# Patient Record
Sex: Female | Born: 1970 | Race: White | Hispanic: No | Marital: Married | Smoking: Former smoker
Health system: Southern US, Community
[De-identification: ages and names within clinical notes are randomized; demographics above are authoritative.]

## PROBLEM LIST (undated history)

## (undated) DIAGNOSIS — E785 Hyperlipidemia, unspecified: Secondary | ICD-10-CM

## (undated) DIAGNOSIS — M797 Fibromyalgia: Secondary | ICD-10-CM

## (undated) DIAGNOSIS — M109 Gout, unspecified: Secondary | ICD-10-CM

## (undated) DIAGNOSIS — T4145XA Adverse effect of unspecified anesthetic, initial encounter: Secondary | ICD-10-CM

## (undated) DIAGNOSIS — M722 Plantar fascial fibromatosis: Secondary | ICD-10-CM

## (undated) DIAGNOSIS — Z972 Presence of dental prosthetic device (complete) (partial): Secondary | ICD-10-CM

## (undated) DIAGNOSIS — M8580 Other specified disorders of bone density and structure, unspecified site: Secondary | ICD-10-CM

## (undated) DIAGNOSIS — E559 Vitamin D deficiency, unspecified: Secondary | ICD-10-CM

## (undated) DIAGNOSIS — F419 Anxiety disorder, unspecified: Secondary | ICD-10-CM

## (undated) DIAGNOSIS — I119 Hypertensive heart disease without heart failure: Secondary | ICD-10-CM

## (undated) DIAGNOSIS — J45909 Unspecified asthma, uncomplicated: Secondary | ICD-10-CM

## (undated) DIAGNOSIS — K259 Gastric ulcer, unspecified as acute or chronic, without hemorrhage or perforation: Secondary | ICD-10-CM

## (undated) DIAGNOSIS — G4730 Sleep apnea, unspecified: Secondary | ICD-10-CM

## (undated) DIAGNOSIS — F32A Depression, unspecified: Secondary | ICD-10-CM

## (undated) DIAGNOSIS — G4700 Insomnia, unspecified: Secondary | ICD-10-CM

## (undated) DIAGNOSIS — R42 Dizziness and giddiness: Secondary | ICD-10-CM

## (undated) DIAGNOSIS — N3010 Interstitial cystitis (chronic) without hematuria: Secondary | ICD-10-CM

## (undated) DIAGNOSIS — J309 Allergic rhinitis, unspecified: Secondary | ICD-10-CM

## (undated) DIAGNOSIS — I10 Essential (primary) hypertension: Secondary | ICD-10-CM

## (undated) DIAGNOSIS — M1990 Unspecified osteoarthritis, unspecified site: Secondary | ICD-10-CM

## (undated) DIAGNOSIS — F329 Major depressive disorder, single episode, unspecified: Secondary | ICD-10-CM

## (undated) DIAGNOSIS — T782XXA Anaphylactic shock, unspecified, initial encounter: Secondary | ICD-10-CM

## (undated) DIAGNOSIS — C539 Malignant neoplasm of cervix uteri, unspecified: Secondary | ICD-10-CM

## (undated) DIAGNOSIS — T8859XA Other complications of anesthesia, initial encounter: Secondary | ICD-10-CM

## (undated) DIAGNOSIS — K219 Gastro-esophageal reflux disease without esophagitis: Secondary | ICD-10-CM

## (undated) DIAGNOSIS — G43009 Migraine without aura, not intractable, without status migrainosus: Secondary | ICD-10-CM

## (undated) DIAGNOSIS — T63441A Toxic effect of venom of bees, accidental (unintentional), initial encounter: Secondary | ICD-10-CM

## (undated) DIAGNOSIS — N809 Endometriosis, unspecified: Secondary | ICD-10-CM

## (undated) DIAGNOSIS — E669 Obesity, unspecified: Secondary | ICD-10-CM

## (undated) DIAGNOSIS — S42309A Unspecified fracture of shaft of humerus, unspecified arm, initial encounter for closed fracture: Secondary | ICD-10-CM

## (undated) HISTORY — DX: Sleep apnea, unspecified: G47.30

## (undated) HISTORY — DX: Toxic effect of venom of bees, accidental (unintentional), initial encounter: T63.441A

## (undated) HISTORY — DX: Unspecified fracture of shaft of humerus, unspecified arm, initial encounter for closed fracture: S42.309A

## (undated) HISTORY — DX: Hyperlipidemia, unspecified: E78.5

## (undated) HISTORY — DX: Hypertensive heart disease without heart failure: I11.9

## (undated) HISTORY — PX: MOUTH SURGERY: SHX715

## (undated) HISTORY — DX: Interstitial cystitis (chronic) without hematuria: N30.10

## (undated) HISTORY — PX: ABDOMINAL HYSTERECTOMY: SHX81

## (undated) HISTORY — DX: Plantar fascial fibromatosis: M72.2

## (undated) HISTORY — DX: Insomnia, unspecified: G47.00

## (undated) HISTORY — DX: Major depressive disorder, single episode, unspecified: F32.9

## (undated) HISTORY — DX: Obesity, unspecified: E66.9

## (undated) HISTORY — DX: Fibromyalgia: M79.7

## (undated) HISTORY — DX: Other specified disorders of bone density and structure, unspecified site: M85.80

## (undated) HISTORY — DX: Endometriosis, unspecified: N80.9

## (undated) HISTORY — DX: Migraine without aura, not intractable, without status migrainosus: G43.009

## (undated) HISTORY — DX: Vitamin D deficiency, unspecified: E55.9

## (undated) HISTORY — DX: Malignant neoplasm of cervix uteri, unspecified: C53.9

## (undated) HISTORY — DX: Depression, unspecified: F32.A

## (undated) HISTORY — PX: OOPHORECTOMY: SHX86

## (undated) HISTORY — DX: Gout, unspecified: M10.9

## (undated) HISTORY — DX: Anaphylactic shock, unspecified, initial encounter: T78.2XXA

## (undated) HISTORY — DX: Allergic rhinitis, unspecified: J30.9

---

## 2001-09-30 HISTORY — PX: TOTAL ABDOMINAL HYSTERECTOMY W/ BILATERAL SALPINGOOPHORECTOMY: SHX83

## 2001-09-30 HISTORY — PX: ABDOMINAL HYSTERECTOMY: SHX81

## 2003-04-12 ENCOUNTER — Other Ambulatory Visit: Admission: RE | Admit: 2003-04-12 | Discharge: 2003-04-12 | Payer: Self-pay | Admitting: Obstetrics and Gynecology

## 2004-09-30 HISTORY — PX: LAPAROSCOPIC SALPINGO OOPHERECTOMY: SHX5927

## 2005-08-30 ENCOUNTER — Encounter (INDEPENDENT_AMBULATORY_CARE_PROVIDER_SITE_OTHER): Payer: Self-pay | Admitting: Specialist

## 2005-08-30 ENCOUNTER — Observation Stay (HOSPITAL_COMMUNITY): Admission: RE | Admit: 2005-08-30 | Discharge: 2005-08-31 | Payer: Self-pay | Admitting: Gynecology

## 2005-11-02 ENCOUNTER — Inpatient Hospital Stay: Payer: Self-pay | Admitting: Psychiatry

## 2005-11-13 ENCOUNTER — Emergency Department: Payer: Self-pay | Admitting: Emergency Medicine

## 2006-02-21 ENCOUNTER — Ambulatory Visit: Payer: Self-pay | Admitting: Gynecology

## 2006-03-06 ENCOUNTER — Emergency Department: Payer: Self-pay | Admitting: Emergency Medicine

## 2006-03-11 ENCOUNTER — Ambulatory Visit: Payer: Self-pay | Admitting: Family Medicine

## 2006-03-24 ENCOUNTER — Ambulatory Visit: Payer: Self-pay | Admitting: Gynecology

## 2006-04-22 ENCOUNTER — Ambulatory Visit: Payer: Self-pay | Admitting: Gynecology

## 2006-04-22 ENCOUNTER — Ambulatory Visit (HOSPITAL_COMMUNITY): Admission: RE | Admit: 2006-04-22 | Discharge: 2006-04-22 | Payer: Self-pay | Admitting: Gynecology

## 2006-05-19 ENCOUNTER — Ambulatory Visit: Payer: Self-pay | Admitting: Gynecology

## 2007-02-03 ENCOUNTER — Ambulatory Visit: Payer: Self-pay | Admitting: Pain Medicine

## 2007-04-16 ENCOUNTER — Ambulatory Visit: Payer: Self-pay | Admitting: Obstetrics & Gynecology

## 2007-11-26 ENCOUNTER — Encounter: Admission: RE | Admit: 2007-11-26 | Discharge: 2007-11-26 | Payer: Self-pay | Admitting: Gynecology

## 2008-05-05 ENCOUNTER — Ambulatory Visit: Payer: Self-pay | Admitting: Gynecology

## 2008-07-12 ENCOUNTER — Ambulatory Visit (HOSPITAL_BASED_OUTPATIENT_CLINIC_OR_DEPARTMENT_OTHER): Admission: RE | Admit: 2008-07-12 | Discharge: 2008-07-12 | Payer: Self-pay | Admitting: Urology

## 2009-09-30 HISTORY — PX: ESOPHAGOGASTRODUODENOSCOPY ENDOSCOPY: SHX5814

## 2009-09-30 HISTORY — PX: COLONOSCOPY: SHX174

## 2010-03-13 ENCOUNTER — Ambulatory Visit: Payer: Self-pay | Admitting: Unknown Physician Specialty

## 2010-10-21 ENCOUNTER — Encounter: Payer: Self-pay | Admitting: Obstetrics & Gynecology

## 2010-11-22 ENCOUNTER — Ambulatory Visit: Payer: Self-pay

## 2011-02-12 NOTE — Group Therapy Note (Signed)
Tina Harvey, Tina Harvey                  ACCOUNT NO.:  [id number]   MEDICAL RECORD NO.:  [id number]          PATIENT TYPE:  POB   LOCATION:  [facility]                   FACILITY:  [facility]   PHYSICIAN:  Elsie Lincoln, MD      DATE OF BIRTH:  Jul 24, 1971   DATE OF SERVICE:                                  CLINIC NOTE   Patient is here today for her annual exam.   Vital signs include a blood pressure of 122/95, pulse 81.  Weight is  230.  Height is 5 feet 8.   She has an allergy to PENICILLIN as well as the ERYTHROMYCINS.   SUBJECTIVE:  Patient is again here for her annual exam.  She has a long-  term history of abnormal Pap smears.  She has had multiple surgeries.  She has now currently had a complete history.  She did have severe  endometriosis.  She is complaining of some ongoing residual pain.  She  also has a history of fibromyalgia as well as migraines.  It is  suggested to her that she may take something for chronic pain.  She does  take Topamax daily for her migraines.  She is currently seeing Dr.  Santiago Glad for her migraines.  She is also complaining of a yeast  infection that was following a round of antibiotics for pneumonia.   PHYSICAL EXAMINATION:  HEENT:  Head is atraumatic, symmetrical, with no  abnormalities.  Eyes:  Pupils are equal, round and reactive to light.  CARDIAC:  Regular rate and rhythm.  LUNGS:  Clear bilaterally.  Patient does continue to have cough.  ABDOMEN:  Obese, soft and nontender with no mass.  GENITALIA:  Externally, she does have some white discharge, and the skin  is somewhat excoriated.  Vaginally, she also has, again, some white  discharge.  She is absent of cervix or cervical cuff.  Pap smear was  performed.  Bimanual exam was not done, as the patient does not have  ovaries or uterus.  BREASTS:  Breasts are asymmetrical.  She has larger breasts on the left  than on the right.   ASSESSMENT/PLAN:  1. Yearly examination.  Patient will  continue to have her yearly      examinations.  At the time of discharge, the patient mentioned that      her 13 year old sister is having breast cancer with metastasis to      the bone, and patient is requesting a mammogram, and that is agreed      to at this point.  2. Vaginal yeast examination:  Patient is given Diflucan to take 150      mg 1 p.o. daily x2 days.  She will be given a refill.  3. Surgical menopause:  Patient is given a prescription of her      Premarin 1.25 mg to take daily, #30 with a year's supply.  She is      asked to follow up in one year, sooner if she has any problems.      Remonia Richter, NP    ______________________________  Elsie Lincoln, MD  LR/MEDQ  D:  04/16/2007  T:  04/16/2007  Job:  [id number]

## 2011-02-12 NOTE — Op Note (Signed)
Tina Harvey, Tina Harvey                  ACCOUNT NO.:  [id number]   MEDICAL RECORD NO.:  [id number]          PATIENT TYPE:  AMB   LOCATION:  [facility]                         FACILITY:  [facility]   PHYSICIAN:  Martina Sinner, MD DATE OF BIRTH:  Nov 16, 1970   DATE OF PROCEDURE:  DATE OF DISCHARGE:                               OPERATIVE REPORT   PREOPERATIVE DIAGNOSIS:  Pelvic pain.   POSTOPERATIVE DIAGNOSIS:  Interstitial cystitis.   SURGERY:  Cystoscopy, hydrodistention, bladder instillation treatment.   Ms. Yetunde Leis has complicated urinary incontinence.  She has primary  urge incontinence but also has stress incontinence.  She has had  urodynamics which have documented this.  She has had abdominal pain and  a lot of vaginal tenderness.  She is post-void dribbling as well as  enuresis.  She has had a TVT.  She has flow symptoms, although she says  they are chronic in nature, but I suspect that she may have some  obstruction at the bladder neck from her TVT.   Today she was examined under anesthesia.  A 21 [place] scope was  utilized.  The bladder mucosa and trigone were normal.  There was no  foreign body or carcinoma.  I had a good look in her bladder, and there  was no foreign body.  She did have an oppressively inflamed urethra but  no foreign body in the urethra.  She was hydrodistended 600 ml.  On re-  examination, she had diffuse glomerulations and bleeding, requiring a  brief irrigation technique.  The urine was nearly clear at the end of  the case.  I emptied her bladder, and using a red rubber catheter, I  instilled 15 cc of 0.5% Marcaine in combination with 400 mg of Pyridium.   Patient was taken to the recovery room.  I am going to treat Ms. Catoe for  interstitial cystitis and overactive bladder first.  We will also treat  her vaginal tenderness.  Her stress incontinence will not be treated at  this point in time and certainly in the future, an injectable would be  likely the  best way to address her outlet if needed.           ______________________________  Martina Sinner, MD  Electronically Signed     SAM/MEDQ  D:  07/12/2008  T:  07/12/2008  Job:  [id number]

## 2011-02-12 NOTE — Assessment & Plan Note (Signed)
NAMECORNELIUS, SCHUITEMA                  ACCOUNT NO.:  [id number]   MEDICAL RECORD NO.:  [id number]          PATIENT TYPE:  POB   LOCATION:  [facility]         FACILITY:  [facility]   PHYSICIAN:  Ginger Carne, MD DATE OF BIRTH:  March 12, 1971   DATE OF SERVICE:  05/05/2008                                  CLINIC NOTE   Ms. Goins is here today for yearly gynecologic evaluation.  The patient  has had in November 2009, a TVT procedure with cystoscopy, laparoscopic  bilateral salpingo-oophorectomy, and removal of cervical stump.  To  date, her principal complaint has been urgency, pain with urination, and  pain if her bladder fills.  The patient denies genitourinary stress  incontinence or specific symptoms referable to an overactive bladder.  She denies vaginal bleeding.  She states that she continues to have hot  flashes, vaginal dryness, and diminished libido.  Otherwise, the patient  has no GI or cardiac complaints.   CURRENT MEDICATIONS:  1. Premarin 1.25 mg daily.  2. Hydrochlorothiazide 25 mg daily.   ALLERGIES:  To PENICILLIN and ERYTHROMYCIN.   Remainder of her past medical history, of note, is she has had 2 vaginal  deliveries in the past.  In 2003, she had a supracervical laparoscopic  hysterectomy.  She has also had a history of migraines.  She has had no  first-degree relatives with breast, colon, ovarian, or uterine  carcinoma.  She had a mammogram in February 2009 that was normal.   SALIENT PHYSICAL FINDINGS:  VITAL SIGNS:  Blood pressure 119/90, weight  240 pounds, height 5 feet 8 inches, and pulse 76 and regular.  HEENT:  Grossly normal.  BREAST:  Without masses, discharge, thickenings, or tenderness.  CHEST:  Clear to percussion and auscultation.  CARDIOVASCULAR:  Without murmurs or enlargements.  Regular rate and  rhythm.  EXTREMITIES:  Within normal limits.  LYMPHATICS:  Within normal limits.  SKIN:  Within normal limits.  NEUROLOGICAL:  Within normal  limits.  MUSCULOSKELETAL:  Within normal limits.  ABDOMEN:  Soft without gross hepatosplenomegaly.  PELVIC:  External genitalia, vulva, and vagina normal.  Cervix was  absent.  The cuff well healed.  There is no tenderness on either adnexa,  but some midline tenderness at the level of the cuff.  Rectovaginal exam  was confirmatory.  There is no evidence of vault prolapse.   IMPRESSION:  1. Diminished libido.  2. Menopausal symptoms.  3. Vaginal dryness.   PLAN:  I have increased Premarin to 1.25 mg twice a day, and in 6 months  asked the patient to return to see if we can lower the dose of Premarin  somewhat to perhaps 1.25 mg in the morning and 0.625 in the evening.  Also, Premarin vaginal cream was prescribed 1-g application twice a week  and hydrochlorothiazide 25 mg daily.  The patient will be referred to  Dr. Alfredo Martinez for evaluation for possible interstitial cystitis.           ______________________________  Ginger Carne, MD     SHB/MEDQ  D:  05/05/2008  T:  05/06/2008  Job:  [id number]

## 2011-02-15 NOTE — H&P (Signed)
Tina Harvey, Tina Harvey                  ACCOUNT NO.:  [id number]   MEDICAL RECORD NO.:  [id number]          PATIENT TYPE:  AMB   LOCATION:  [facility]                           [facility]   PHYSICIAN:  Ginger Carne, MD  DATE OF BIRTH:  [birth year]   DATE OF ADMISSION:  DATE OF DISCHARGE:                                HISTORY & PHYSICAL   HISTORY OF PRESENT ILLNESS:  This patient is a 40 year old gravida 2, para 2-  0-0-2 Caucasian female scheduled for a tension free vaginal tape procedure  cystoscopy, laparoscopic bilateral salpingo-oophorectomy and removal of  cervical stump.  The patient has had a long-standing history of genuine  urinary stress incontinence.  She loses urine with coughing, straining, and  other Valsalva maneuvers.  The patient denies complaints of post void  dribbling, nocturia, or having to strain to void.  She has never had  previous urological or bladder surgery, denies a history of fecal  incontinence and takes no medications, and has no known medical diseases to  enhance her propensity to lose urine.   The patient underwent in-office cystometric evaluation confirming said  diagnosis.  Her urine analysis was normal and patient demonstrates loss of  urine on straining and coughing.   The patient had undergone, in April of 2003, a laparoscopic supracervical  hysterectomy by another physician because of menorrhagia, polycystic ovarian  syndrome and dysmenorrhea.  Operative report obtained does not confirm  evidence for endometriosis.  The patient has had continued cramping and  spotting, and bleeding since her surgery.  The patient was unaware at that  time of said surgery that this can be a complication.  She has complained of  bilateral lower pelvic discomfort throughout the month as well.  She also  has dyspareunia.   OBSTETRICAL/GYNECOLOGICAL HISTORY:  The patient, in 1992, had twin vaginal  delivery with one fetal demise.  In 1997, the patient had a normal  vaginal  delivery.  She has had a cone biopsy in the past for cervical carcinoma.   MEDICAL HISTORY:  The patient is hypertensive, has fibromyalgia.   SURGICAL HISTORY:  Per above.   MEDICATIONS:  1.  Zoloft 100 mg one in the morning daily.  2.  Spectracef 200 mg every 12 hours.  3.  Hydrochlorothiazide 12.5 mg daily in the morning.  4.  Trazodone 50 mg at night as needed.  5.  Darvocet every four to six hours as needed pain.   SOCIAL HISTORY:  The patient stopped smoking nine years ago, denies alcohol  or drug abuse.   FAMILY HISTORY:  Her mother has hypertension and her father has type 2  diabetes and hypertension.  No first degree relatives with breast, colon,  ovarian, or uterine carcinoma.   REVIEW OF SYSTEMS:  Negative.   PHYSICAL EXAMINATION:  VITAL SIGNS:  Blood pressure is 120/82, height 5  foot, 8 inches.  Weight 232 pounds.  HEENT:  Grossly normal.  BREAST EXAMINATION:  Without mass or discharge, thickenings or tenderness.  CHEST:  Clear to percussion and auscultation.  CARDIOVASCULAR EXAMINATION:  Without murmurs  or enlargements.  Regular rate  and rhythm.  EXTREMITY, LYMPHATIC, SKIN, NEUROLOGICAL, AND MUSCULOSKELETAL SYSTEMS:  Within normal limits.  ABDOMEN:  Soft with gross hepatosplenomegaly.  PELVIC EXAMINATION:  Normal Pap smear.  External genitalia, vulva and vagina  normal.  Cervix is tender on palpation.  Both adnexa palpable, found to be  normal.  RECTAL EXAMINATION:  Hemoccult negative, without masses.   Urinanalysis is normal.  In-office cystometric evaluation demonstrates no  evidence for an overactive bladder and visual inspection demonstrates loss  of urine on coughing and straining.   IMPRESSION:  1.  Genuine urinary stress incontinence.  2.  Bilateral pelvic pain, dyspareunia and probable bilateral ovarian      adhesive disease with possible endometriosis.   PLAN:  Patient desires to have a surgical repair for her incontinence.  Ashby Dawes of  said procedure discussed in detail.  Risks including possible  graft rejection, erosion, and/or infection were discussed including  postoperative bleeding, urinary retention, and/or recurrent incontinence.  The patient prefers to have her cervical stump removed because of her past  history of carcinoma in situ of the cervix, and because of persistent  bleeding and cramping.  The patient wishes to have her ovaries removed due  to a mild degree of hirsutism, polycystic ovarian syndrome previously  diagnosed, and also because of dyspareunia.  The use of hormone replacement  therapy discussed in detail with the patient including pros and cons.  All  questions answered to the satisfaction of said patient.      Ginger Carne, MD  Electronically Signed     SHB/MEDQ  D:  08/28/2005  T:  08/28/2005  Job:  [id number]

## 2011-02-15 NOTE — Discharge Summary (Signed)
Tina Harvey, FUENTE                  ACCOUNT NO.:  [id number]   MEDICAL RECORD NO.:  [id number]          PATIENT TYPE:  OBV   LOCATION:  9304                          [facility]ACILITY[facility]   PHYSICIAN:  Ginger Carne, MD  DATE OF BIRTH:  Nov 02, 1970   DATE OF ADMISSION:  08/30/2005  DATE OF DISCHARGE:                                 DISCHARGE SUMMARY   REASONS FOR HOSPITALIZATION:  Genuine urinary stress incontinence,  polycystic ovarian syndrome, chronic pelvic pain, cyclic bleeding from  cervical stump, and second-degree cystocele.   IN-HOSPITAL PROCEDURES:  1.  Laparoscopic bilateral salpingo-oophorectomy.  2.  Removal of cervical stump.  3.  Anterior colporrhaphies.  4.  Tension-free vaginal tape procedure with cystoscopy   FINAL DIAGNOSIS:  1.  Genuine urinary stress incontinence.  2.  Polycystic ovarian syndrome.  3.  Chronic pelvic pain.  4.  Cyclic bleeding from cervical stump.  5.  Second-degree cystocele.   HOSPITAL COURSE:  This is a 40 year old, multiparous, Caucasian female  status post laparoscopic supracervical hysterectomy by another physician,  admitted because of the aforementioned diagnoses.  The patient had been  appropriately worked up preoperatively for her genuine urinary stress  incontinence in addition to her history of chronic pelvic pain.  The patient  was taken to the operating room, on December1,2006, and underwent the  aforementioned procedures.  Her postoperative course was uneventful.  She was afebrile.  She voided on  her own after Foley catheter was removed.  H&H 31.3, 10.7.  Minimal to no  vaginal bleeding.  Abdomen was soft.  Incision was dry.  Calves without  tenderness. Lungs were clear.   She was discharged with:  1.  Percocet 5/325, one to two every four to six hours.  2.  Premarin 1.25 mg daily.  3.  Levaquin 500 mg daily for 7 days.   The patient will continue her home medications of hydrochlorothiazide,  trazodone, Zoloft.   She will  return to the office in early January2007 for her postoperative  visit.   The patient was advised to contact the office for temperature elevation  above 100.4 degrees Fahrenheit, increasing abdominal pain, incisional pain  or drainage, increased vaginal bleeding, unresolved constipation, difficulty  with  voiding, including increasing urgency and urinary retention, having to  strain to void, or recurrent incontinence.  The patient was asked to have  time voids every four hours for the next four weeks.  All questions answered  to the satisfaction said the patient.      Ginger Carne, MD  Electronically Signed     SHB/MEDQ  D:  08/31/2005  T:  08/31/2005  Job:  [id number]

## 2011-02-15 NOTE — Op Note (Signed)
Tina Harvey, Tina Harvey                  ACCOUNT NO.:  [id number]   MEDICAL RECORD NO.:  [id number]          PATIENT TYPE:  OBV   LOCATION:  9399                          [facility]ACILITY[facility]   PHYSICIAN:  Ginger Carne, MD  DATE OF BIRTH:  01-Sep-1971   DATE OF PROCEDURE:  08/30/2005  DATE OF DISCHARGE:                                 OPERATIVE REPORT   PREOPERATIVE DIAGNOSIS:  Genuine urinary stress incontinence, polycystic  ovarian syndrome, chronic pelvic pain, and cyclic vaginal bleeding from  remnant cervical stump.  Second-degree cystocele.   POSTOPERATIVE DIAGNOSIS:  Genuine urinary stress incontinence, polycystic  ovarian syndrome, chronic pelvic pain, and cyclic vaginal bleeding from  remnant cervical stump. Second-degree cystocele.  Bilateral ovarian  adhesions.   PROCEDURE:  Laparoscopic bilateral salpingo-oophorectomy, excision of  cervical stump, tension-free vaginal tape procedure, cystoscopy and  laparoscopic lysis of intestinal peritoneal adhesions.  Anterior  colporrhaphy.   SURGEON:  Ginger Carne, M.D.   ASSISTANT:  None.   COMPLICATIONS:  None immediate.   ESTIMATED BLOOD LOSS:  75 mL.   SPECIMEN:  Cervical stump, right and left tube and ovary.   ANESTHESIA:  General.   OPERATIVE FINDINGS:  Laparoscopic evaluation revealed multiple peritoneal  intestinal adhesions along the vaginal cuff and bilateral adnexa. There were  areas of endometriosis as well on the lateral side walls which were excised.  Both tubes and ovaries had adhesive disease between intestine and omentum as  well. No other gross findings noted. The cervical stump measured  approximately 2 cm.   OPERATIVE PROCEDURE:  The patient prepped and draped in usual fashion and  placed in the lithotomy position. Betadine solution used for antiseptic and  the patient was catheterized prior to procedure. After adequate general  anesthesia, a vertical infraumbilical incision was made and the Veress  needle placed in the abdomen. Opening closing pressures were 10-15 mmHg.  Needle released, trocar placed in the same incision. Laparoscope placed in  the trocar sleeve. Two 5 mm ports were made under direct visualization in  left lower quadrant and left hypogastric regions. Careful and meticulous  dissection and lysis of intestinal peritoneal adhesions was performed. This  also included removing adhesive disease from both ovaries and tubes.  Following this, hemostasis was performed. Ureters identified bilaterally and  the infundibulopelvic ligaments bilaterally were bipolar cauterized and cut.  The adnexa were then removed with an Endopouch bag. No active bleeding  noted. Gas released, trocars removed. Closure of a 10 mL fascia site with 0  Vicryl suture and 4-0 Vicryl for subcuticular closure.   Attention was directed to the vaginal portion of the procedure. The cervical  stump was infiltrated with Marcaine and epinephrine circumferentially.  Anterior and posterior vaginal incisions made transversely. The peritoneal  reflection was dissected off anteriorly and posteriorly. The uterosacral  cardinal ligament complexes clamped, cut and ligated with 0 Vicryl suture.  This extended along the lateral border of the stump with 0 Vicryl suture. At  this point the stump was excised. Bleeding points hemostatically checked and  closure of the vaginal epithelium with 0  Vicryl running interlocking suture.  Attention was then directed to the TVT portion of the procedure.   A vertical midline incision was made in the anterior vaginal epithelium.  Pubovesical cervical fascia dissected bilaterally to the space of Retzius  which was entered digitally. The bladder was then mobilized to facilitate  placement of a bottom up advantage TVT system. These emanated about 1-2 cm  lateral to the midline of the symphysis pubis. The trocar hugged the  posterior aspect of the pubic bone. Following this, cystoscopy  performed. No  injury to bladder noted, no evidence for tape noted, and the urethra  appeared normal. The bladder was then emptied and filled to 250 mL of saline  and clamped. Tape adjusted and the plastic sleeves removed. Tape cut below  skin and the skin trocar sites closed with Dermabond. More irrigation  utilized. An anterior repair was performed with 0 Vicryl suture ensuring  that this did not impede near the mid urethral region.  Copious irrigation  followed. Bleeding points hemostatically checked. Closure of the vaginal  epithelium after minimal trimming with 2-0 Monocryl running interlocking  suture. Bleeding points hemostatically were checked in the vaginal walls.  The patient returned to the post anesthesia recovery room at the end of the  procedure. Urine was clear at the end of the procedure.      Ginger Carne, MD  Electronically Signed     SHB/MEDQ  D:  08/30/2005  T:  08/30/2005  Job:  [id number]

## 2011-02-15 NOTE — Op Note (Signed)
Tina, Harvey                  ACCOUNT NO.:  [id number]   MEDICAL RECORD NO.:  [id number]          PATIENT TYPE:  AMB   LOCATION:  [facility]                           [facility]   PHYSICIAN:  Ginger Carne, MD  DATE OF BIRTH:  05-14-1971   DATE OF PROCEDURE:  04/22/2006  DATE OF DISCHARGE:                                 OPERATIVE REPORT   PREOPERATIVE DIAGNOSIS:  Vaginal cuff granulation tissue.   POSTOP DIAGNOSIS:  Vaginal cuff granulation tissue.   PROCEDURE:  Cauterization of vaginal cuff granulation tissue.   SURGEON:  Blima Rich, M.D.   ASSISTANT:  None.   COMPLICATIONS:  None immediate.   ESTIMATED BLOOD LOSS:  Minimal.   SPECIMEN:  None.   OPERATIVE FINDINGS:  The patient demonstrated granulation tissue at the  vaginal cuff secondary to a prior trachelectomy.  No other lesions noted.   OPERATIVE PROCEDURE:  The patient prepped and draped in usual fashion and  placed in the lithotomy position.  Betadine solution used for antiseptic and  the patient was catheterized prior to procedure.  After adequate general  anesthesia, a 50 watt cauterization was performed to excise said lesions.  Minimal bleeding noted at the end of the procedure.  Sulfa cream utilized  afterwards on cauterized areas.  The patient tolerated procedure well and  returned to the post anesthesia recovery room in excellent condition.      Ginger Carne, MD  Electronically Signed     SHB/MEDQ  D:  04/22/2006  T:  04/22/2006  Job:  [id number]

## 2011-07-02 LAB — POCT I-STAT, CHEM 8
BUN: 10
Calcium, Ion: 1.2
Chloride: 101
Creatinine, Ser: 0.8
Glucose, Bld: 87
HCT: 37
Hemoglobin: 12.6
Potassium: 4.1
Sodium: 139
TCO2: 30

## 2011-08-16 ENCOUNTER — Ambulatory Visit: Payer: Self-pay | Admitting: Family Medicine

## 2011-08-27 ENCOUNTER — Ambulatory Visit: Payer: Self-pay | Admitting: Anesthesiology

## 2011-08-27 DIAGNOSIS — I10 Essential (primary) hypertension: Secondary | ICD-10-CM

## 2011-08-29 ENCOUNTER — Ambulatory Visit: Payer: Self-pay | Admitting: General Surgery

## 2011-08-29 HISTORY — PX: CHOLECYSTECTOMY: SHX55

## 2011-08-30 LAB — PATHOLOGY REPORT

## 2011-10-26 ENCOUNTER — Emergency Department: Payer: Self-pay | Admitting: Emergency Medicine

## 2011-10-27 LAB — CBC WITH DIFFERENTIAL/PLATELET
Basophil #: 0 10*3/uL (ref 0.0–0.1)
Eosinophil %: 3.8 %
HCT: 39.9 % (ref 35.0–47.0)
HGB: 13.2 g/dL (ref 12.0–16.0)
Lymphocyte %: 40.8 %
Monocyte %: 8.9 %
Neutrophil #: 4.8 10*3/uL (ref 1.4–6.5)
Platelet: 270 10*3/uL (ref 150–440)
RBC: 4.56 10*6/uL (ref 3.80–5.20)
RDW: 15.6 % — ABNORMAL HIGH (ref 11.5–14.5)
WBC: 10.3 10*3/uL (ref 3.6–11.0)

## 2012-01-03 ENCOUNTER — Ambulatory Visit: Payer: Self-pay | Admitting: Anesthesiology

## 2012-01-03 LAB — POTASSIUM: Potassium: 3.9 mmol/L (ref 3.5–5.1)

## 2012-01-08 ENCOUNTER — Ambulatory Visit: Payer: Self-pay | Admitting: Urology

## 2012-05-13 ENCOUNTER — Emergency Department: Payer: Self-pay | Admitting: Emergency Medicine

## 2012-05-13 LAB — COMPREHENSIVE METABOLIC PANEL
Albumin: 3.4 g/dL (ref 3.4–5.0)
Alkaline Phosphatase: 87 U/L (ref 50–136)
Anion Gap: 8 (ref 7–16)
BUN: 13 mg/dL (ref 7–18)
Bilirubin,Total: 0.4 mg/dL (ref 0.2–1.0)
Calcium, Total: 9.2 mg/dL (ref 8.5–10.1)
Chloride: 102 mmol/L (ref 98–107)
Co2: 31 mmol/L (ref 21–32)
Creatinine: 0.87 mg/dL (ref 0.60–1.30)
EGFR (African American): >60
EGFR (Non-African Amer.): >60
Glucose: 113 mg/dL — ABNORMAL HIGH (ref 65–99)
Osmolality: 282 (ref 275–301)
Potassium: 3.1 mmol/L — ABNORMAL LOW (ref 3.5–5.1)
SGOT(AST): 47 U/L — ABNORMAL HIGH (ref 15–37)
SGPT (ALT): 43 U/L (ref 12–78)
Sodium: 141 mmol/L (ref 136–145)
Total Protein: 8.2 g/dL (ref 6.4–8.2)

## 2012-05-13 LAB — CBC
HCT: 38.3 % (ref 35.0–47.0)
HGB: 13.1 g/dL (ref 12.0–16.0)
MCH: 28.3 pg (ref 26.0–34.0)
MCHC: 34.2 g/dL (ref 32.0–36.0)
MCV: 83 fL (ref 80–100)
Platelet: 254 10*3/uL (ref 150–440)
RBC: 4.64 10*6/uL (ref 3.80–5.20)
RDW: 14.3 % (ref 11.5–14.5)
WBC: 9.6 10*3/uL (ref 3.6–11.0)

## 2012-05-13 LAB — URINALYSIS, COMPLETE
Bacteria: NONE SEEN
Bilirubin,UR: NEGATIVE
Glucose,UR: NEGATIVE mg/dL (ref 0–75)
Leukocyte Esterase: NEGATIVE
Ph: 6 (ref 4.5–8.0)
Protein: NEGATIVE
Specific Gravity: 1.021 (ref 1.003–1.030)
Squamous Epithelial: 5

## 2012-05-13 LAB — CK TOTAL AND CKMB (NOT AT ARMC)
CK, Total: 115 U/L (ref 21–215)
CK-MB: <0.5 ng/mL — ABNORMAL LOW (ref 0.5–3.6)

## 2012-05-13 LAB — PRO B NATRIURETIC PEPTIDE: B-Type Natriuretic Peptide: 32 pg/mL (ref 0–125)

## 2012-05-13 LAB — TROPONIN I: Troponin-I: <0.02 ng/mL

## 2012-08-03 ENCOUNTER — Ambulatory Visit: Payer: Self-pay

## 2012-09-25 ENCOUNTER — Ambulatory Visit: Payer: Self-pay | Admitting: Family Medicine

## 2012-11-18 ENCOUNTER — Emergency Department: Payer: Self-pay | Admitting: Emergency Medicine

## 2012-11-18 LAB — BASIC METABOLIC PANEL
BUN: 15 mg/dL (ref 7–18)
Calcium, Total: 8.8 mg/dL (ref 8.5–10.1)
Chloride: 106 mmol/L (ref 98–107)
Creatinine: 1.14 mg/dL (ref 0.60–1.30)
EGFR (African American): >60
Glucose: 91 mg/dL (ref 65–99)
Osmolality: 285 (ref 275–301)
Potassium: 3.5 mmol/L (ref 3.5–5.1)
Sodium: 143 mmol/L (ref 136–145)

## 2012-11-18 LAB — CBC WITH DIFFERENTIAL/PLATELET
Basophil %: 0.4 %
Eosinophil #: 0.4 10*3/uL (ref 0.0–0.7)
Eosinophil %: 4.2 %
HCT: 37.2 % (ref 35.0–47.0)
Lymphocyte #: 4.1 10*3/uL — ABNORMAL HIGH (ref 1.0–3.6)
MCH: 26.1 pg (ref 26.0–34.0)
MCV: 84 fL (ref 80–100)
Monocyte #: 0.9 x10 3/mm (ref 0.2–0.9)
Neutrophil #: 4.5 10*3/uL (ref 1.4–6.5)
Neutrophil %: 45.2 %
Platelet: 263 10*3/uL (ref 150–440)
RDW: 14.3 % (ref 11.5–14.5)
WBC: 9.9 10*3/uL (ref 3.6–11.0)

## 2013-01-20 ENCOUNTER — Emergency Department: Payer: Self-pay | Admitting: Emergency Medicine

## 2013-06-14 ENCOUNTER — Ambulatory Visit: Payer: Self-pay | Admitting: Family Medicine

## 2013-06-30 DIAGNOSIS — S42309A Unspecified fracture of shaft of humerus, unspecified arm, initial encounter for closed fracture: Secondary | ICD-10-CM

## 2013-06-30 HISTORY — DX: Unspecified fracture of shaft of humerus, unspecified arm, initial encounter for closed fracture: S42.309A

## 2013-07-24 ENCOUNTER — Encounter (HOSPITAL_COMMUNITY): Payer: Self-pay | Admitting: Emergency Medicine

## 2013-07-24 ENCOUNTER — Emergency Department (HOSPITAL_COMMUNITY): Payer: No Typology Code available for payment source

## 2013-07-24 ENCOUNTER — Emergency Department (HOSPITAL_COMMUNITY)
Admission: EM | Admit: 2013-07-24 | Discharge: 2013-07-24 | Disposition: A | Discharge status: Home / Self Care | Payer: No Typology Code available for payment source | Attending: Emergency Medicine | Admitting: Emergency Medicine

## 2013-07-24 DIAGNOSIS — M79602 Pain in left arm: Secondary | ICD-10-CM

## 2013-07-24 DIAGNOSIS — Z880 Allergy status to penicillin: Secondary | ICD-10-CM | POA: Insufficient documentation

## 2013-07-24 DIAGNOSIS — M542 Cervicalgia: Secondary | ICD-10-CM

## 2013-07-24 DIAGNOSIS — S0993XA Unspecified injury of face, initial encounter: Secondary | ICD-10-CM | POA: Insufficient documentation

## 2013-07-24 DIAGNOSIS — Y92410 Unspecified street and highway as the place of occurrence of the external cause: Secondary | ICD-10-CM | POA: Insufficient documentation

## 2013-07-24 DIAGNOSIS — Z79899 Other long term (current) drug therapy: Secondary | ICD-10-CM | POA: Insufficient documentation

## 2013-07-24 DIAGNOSIS — Y9389 Activity, other specified: Secondary | ICD-10-CM | POA: Insufficient documentation

## 2013-07-24 DIAGNOSIS — M795 Residual foreign body in soft tissue: Secondary | ICD-10-CM

## 2013-07-24 DIAGNOSIS — E119 Type 2 diabetes mellitus without complications: Secondary | ICD-10-CM | POA: Insufficient documentation

## 2013-07-24 DIAGNOSIS — IMO0002 Reserved for concepts with insufficient information to code with codable children: Secondary | ICD-10-CM | POA: Insufficient documentation

## 2013-07-24 DIAGNOSIS — I10 Essential (primary) hypertension: Secondary | ICD-10-CM | POA: Insufficient documentation

## 2013-07-24 HISTORY — DX: Essential (primary) hypertension: I10

## 2013-07-24 MED ORDER — HYDROMORPHONE HCL PF 1 MG/ML IJ SOLN
1.0000 mg | Freq: Once | INTRAMUSCULAR | Status: AC
  Administered 2013-07-24: 1 mg via INTRAMUSCULAR
  Filled 2013-07-24: qty 1

## 2013-07-24 MED ORDER — ONDANSETRON HCL 4 MG PO TABS: 4.0000 mg | ORAL_TABLET | Freq: Four times a day (QID) | ORAL | Status: DC

## 2013-07-24 MED ORDER — HYDROMORPHONE HCL PF 1 MG/ML IJ SOLN
1.0000 mg | Freq: Once | INTRAMUSCULAR | Status: AC
  Administered 2013-07-24: 1 mg via INTRAVENOUS
  Filled 2013-07-24: qty 1

## 2013-07-24 MED ORDER — ONDANSETRON HCL 4 MG/2ML IJ SOLN
4.0000 mg | Freq: Once | INTRAMUSCULAR | Status: AC
  Administered 2013-07-24: 4 mg via INTRAVENOUS
  Filled 2013-07-24: qty 2

## 2013-07-24 MED ORDER — ONDANSETRON 4 MG PO TBDP
4.0000 mg | ORAL_TABLET | Freq: Once | ORAL | Status: AC
  Administered 2013-07-24: 4 mg via ORAL
  Filled 2013-07-24: qty 1

## 2013-07-24 MED ORDER — IBUPROFEN 600 MG PO TABS: 600.0000 mg | ORAL_TABLET | Freq: Four times a day (QID) | ORAL | Status: DC | PRN

## 2013-07-24 MED ORDER — HYDROCODONE-ACETAMINOPHEN 5-325 MG PO TABS: 1.0000 | ORAL_TABLET | ORAL | Status: DC | PRN
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
## 2013-07-24 NOTE — ED Notes (Signed)
Patient transported to X-ray 

## 2013-07-24 NOTE — ED Notes (Signed)
The tech cleaned left upper arm and applied dry dressing.

## 2013-07-24 NOTE — ED Notes (Signed)
Left elbow area cleansed, dried, nonadherent dressing followed by 4x4 placed, wrapped with kirlex.  Pt instructed on cleansing of wound.
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
## 2013-07-24 NOTE — ED Notes (Signed)
Returned from radiology.  C/O pain 9/10.  MD notified, orders rec'd.
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
## 2013-09-30 HISTORY — PX: GASTRIC BYPASS: SHX52

## 2013-12-04 ENCOUNTER — Emergency Department: Payer: Self-pay | Admitting: Emergency Medicine

## 2013-12-07 ENCOUNTER — Ambulatory Visit: Payer: Self-pay | Admitting: Family Medicine

## 2014-01-17 ENCOUNTER — Ambulatory Visit: Payer: Self-pay | Admitting: Bariatrics

## 2014-02-23 ENCOUNTER — Emergency Department: Payer: Self-pay | Admitting: Emergency Medicine

## 2014-02-23 LAB — URINALYSIS, COMPLETE
BACTERIA: NONE SEEN
Bilirubin,UR: NEGATIVE
Blood: NEGATIVE
Glucose,UR: >=500 mg/dL (ref 0–75)
Hyaline Cast: 7
Nitrite: NEGATIVE
PH: 7 (ref 4.5–8.0)
RBC,UR: 2 /HPF (ref 0–5)
SPECIFIC GRAVITY: 1.025 (ref 1.003–1.030)
Squamous Epithelial: 7

## 2014-02-23 LAB — CBC
HCT: 41.9 % (ref 35.0–47.0)
HGB: 13.5 g/dL (ref 12.0–16.0)
MCH: 27.2 pg (ref 26.0–34.0)
MCHC: 32.3 g/dL (ref 32.0–36.0)
MCV: 84 fL (ref 80–100)
Platelet: 266 10*3/uL (ref 150–440)
RBC: 4.97 10*6/uL (ref 3.80–5.20)
RDW: 13.9 % (ref 11.5–14.5)
WBC: 10.6 10*3/uL (ref 3.6–11.0)

## 2014-02-23 LAB — LIPASE, BLOOD: LIPASE: 137 U/L (ref 73–393)

## 2014-02-23 LAB — COMPREHENSIVE METABOLIC PANEL
ALK PHOS: 112 U/L
ALT: 114 U/L — AB (ref 12–78)
Albumin: 3.7 g/dL (ref 3.4–5.0)
Anion Gap: 8 (ref 7–16)
BUN: 12 mg/dL (ref 7–18)
Bilirubin,Total: 0.4 mg/dL (ref 0.2–1.0)
CHLORIDE: 97 mmol/L — AB (ref 98–107)
CREATININE: 0.87 mg/dL (ref 0.60–1.30)
Calcium, Total: 10.3 mg/dL — ABNORMAL HIGH (ref 8.5–10.1)
Co2: 27 mmol/L (ref 21–32)
EGFR (African American): >60
EGFR (Non-African Amer.): >60
Glucose: 314 mg/dL — ABNORMAL HIGH (ref 65–99)
Osmolality: 276 (ref 275–301)
POTASSIUM: 3.8 mmol/L (ref 3.5–5.1)
SGOT(AST): 122 U/L — ABNORMAL HIGH (ref 15–37)
Sodium: 132 mmol/L — ABNORMAL LOW (ref 136–145)
Total Protein: 9.1 g/dL — ABNORMAL HIGH (ref 6.4–8.2)

## 2014-04-26 DIAGNOSIS — G4733 Obstructive sleep apnea (adult) (pediatric): Secondary | ICD-10-CM

## 2014-04-26 HISTORY — DX: Obstructive sleep apnea (adult) (pediatric): G47.33

## 2014-08-04 ENCOUNTER — Ambulatory Visit: Payer: Self-pay | Admitting: Family Medicine

## 2014-08-17 ENCOUNTER — Ambulatory Visit: Payer: Self-pay | Admitting: Bariatrics

## 2014-10-10 ENCOUNTER — Ambulatory Visit: Payer: Self-pay | Admitting: Bariatrics

## 2014-10-10 LAB — COMPREHENSIVE METABOLIC PANEL
ALK PHOS: 96 U/L
ANION GAP: 5 — AB (ref 7–16)
Albumin: 3.6 g/dL (ref 3.4–5.0)
BUN: 15 mg/dL (ref 7–18)
Bilirubin,Total: 0.4 mg/dL (ref 0.2–1.0)
CALCIUM: 9.2 mg/dL (ref 8.5–10.1)
CREATININE: 0.86 mg/dL (ref 0.60–1.30)
Chloride: 105 mmol/L (ref 98–107)
Co2: 30 mmol/L (ref 21–32)
EGFR (African American): >60
GLUCOSE: 94 mg/dL (ref 65–99)
Osmolality: 280 (ref 275–301)
Potassium: 3.6 mmol/L (ref 3.5–5.1)
SGOT(AST): 21 U/L (ref 15–37)
SGPT (ALT): 24 U/L
Sodium: 140 mmol/L (ref 136–145)
Total Protein: 8.3 g/dL — ABNORMAL HIGH (ref 6.4–8.2)

## 2014-10-10 LAB — CBC WITH DIFFERENTIAL/PLATELET
BASOS PCT: 0.3 %
Basophil #: 0 10*3/uL (ref 0.0–0.1)
EOS ABS: 0.1 10*3/uL (ref 0.0–0.7)
Eosinophil %: 1.7 %
HCT: 40.2 % (ref 35.0–47.0)
HGB: 13 g/dL (ref 12.0–16.0)
LYMPHS ABS: 2.5 10*3/uL (ref 1.0–3.6)
Lymphocyte %: 30.4 %
MCH: 27.2 pg (ref 26.0–34.0)
MCHC: 32.4 g/dL (ref 32.0–36.0)
MCV: 84 fL (ref 80–100)
MONO ABS: 0.5 x10 3/mm (ref 0.2–0.9)
Monocyte %: 5.8 %
Neutrophil #: 5.1 10*3/uL (ref 1.4–6.5)
Neutrophil %: 61.8 %
Platelet: 264 10*3/uL (ref 150–440)
RBC: 4.78 10*6/uL (ref 3.80–5.20)
RDW: 14.5 % (ref 11.5–14.5)
WBC: 8.3 10*3/uL (ref 3.6–11.0)

## 2014-10-10 LAB — AMYLASE: Amylase: 68 U/L (ref 25–115)

## 2014-10-10 LAB — MAGNESIUM: MAGNESIUM: 2 mg/dL

## 2014-10-10 LAB — FOLATE: Folic Acid: 4.1 ng/mL (ref 3.1–17.5)

## 2014-10-10 LAB — FERRITIN: FERRITIN (ARMC): 57 ng/mL (ref 8–388)

## 2014-10-10 LAB — IRON: Iron: 56 ug/dL (ref 50–170)

## 2014-10-10 LAB — PHOSPHORUS: Phosphorus: 3.3 mg/dL (ref 2.5–4.9)

## 2014-11-16 ENCOUNTER — Encounter: Payer: Self-pay | Admitting: *Deleted

## 2014-11-16 ENCOUNTER — Ambulatory Visit (INDEPENDENT_AMBULATORY_CARE_PROVIDER_SITE_OTHER): Payer: BLUE CROSS/BLUE SHIELD | Admitting: Neurology

## 2014-11-16 ENCOUNTER — Encounter: Payer: Self-pay | Admitting: Neurology

## 2014-11-16 VITALS — BP 144/93 | HR 76 | Ht 68.0 in | Wt 186.0 lb

## 2014-11-16 DIAGNOSIS — G43009 Migraine without aura, not intractable, without status migrainosus: Secondary | ICD-10-CM

## 2014-11-16 DIAGNOSIS — R479 Unspecified speech disturbances: Secondary | ICD-10-CM

## 2014-11-16 DIAGNOSIS — Z87828 Personal history of other (healed) physical injury and trauma: Secondary | ICD-10-CM

## 2014-11-16 HISTORY — DX: Migraine without aura, not intractable, without status migrainosus: G43.009

## 2014-11-16 MED ORDER — RIZATRIPTAN BENZOATE 10 MG PO TBDP: 10.0000 mg | ORAL_TABLET | ORAL | Status: DC | PRN

## 2014-11-16 MED ORDER — TOPIRAMATE ER 50 MG PO CAP24: ORAL_CAPSULE | ORAL | Status: DC

## 2014-11-16 NOTE — Patient Instructions (Signed)
Magnesium oxide 400 mg twice a day Riboflavin  100 mg twice a day 
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
## 2014-11-21 DIAGNOSIS — Z87828 Personal history of other (healed) physical injury and trauma: Secondary | ICD-10-CM | POA: Insufficient documentation

## 2014-11-25 DIAGNOSIS — G43009 Migraine without aura, not intractable, without status migrainosus: Secondary | ICD-10-CM | POA: Diagnosis not present

## 2014-11-28 ENCOUNTER — Other Ambulatory Visit: Payer: Self-pay | Admitting: Neurology

## 2014-11-28 DIAGNOSIS — G43009 Migraine without aura, not intractable, without status migrainosus: Secondary | ICD-10-CM

## 2014-11-28 DIAGNOSIS — R479 Unspecified speech disturbances: Secondary | ICD-10-CM

## 2014-11-28 DIAGNOSIS — Z87828 Personal history of other (healed) physical injury and trauma: Secondary | ICD-10-CM

## 2014-12-30 DIAGNOSIS — K259 Gastric ulcer, unspecified as acute or chronic, without hemorrhage or perforation: Secondary | ICD-10-CM

## 2014-12-30 HISTORY — DX: Gastric ulcer, unspecified as acute or chronic, without hemorrhage or perforation: K25.9

## 2015-01-09 ENCOUNTER — Ambulatory Visit: Payer: BLUE CROSS/BLUE SHIELD | Admitting: Neurology

## 2015-01-12 DIAGNOSIS — E785 Hyperlipidemia, unspecified: Secondary | ICD-10-CM | POA: Insufficient documentation

## 2015-01-12 DIAGNOSIS — M797 Fibromyalgia: Secondary | ICD-10-CM | POA: Insufficient documentation

## 2015-01-22 NOTE — Op Note (Signed)
PATIENT NAMECAMBREA, Tina Harvey MR#:  [id number] DATE OF BIRTH:  May 04, 1971  DATE OF PROCEDURE:  01/08/2012  PREOPERATIVE DIAGNOSIS: Interstitial cystitis.   POSTOPERATIVE DIAGNOSIS: Interstitial cystitis.  PROCEDURE: Cystoscopy with hydrodistention.   SURGEON: Rickey Sadowski C. Bernardo Heater, MD   ASSISTANT: None.   ANESTHESIA:  General.   INDICATIONS:  A 44 year old female previously seen at [facility] 4 to 5 years ago for pelvic pain and irritative voiding symptoms. She states she underwent hydrodistention and was diagnosed with interstitial cystitis. She had marked improvement in her symptoms after hydrodistention which have recently recurred. She has been given an anticholinergic trial with mild improvement in her symptoms.  She has requested repeat hydrodistention.    DESCRIPTION OF PROCEDURE: The patient was taken to the operating room where general anesthetic was administered. She was placed in the low lithotomy position, and her external genitalia were prepped and draped sterilely. A timeout was performed. A 17-French  cystoscope sheath with obturator was lubricated and passed per urethra. The urethra was slightly tight to the 21-French, and the urethra was dilated up to 28-French  with Elby Showers sounds. The cystoscope sheath with obturator was repassed. Panendoscopy was performed with 30 and 70-degree lenses, and the bladder mucosa was normal without erythema, solid or papillary lesions. The ureteral orifices were normal-appearing with clear efflux.   Under 80 centimeters of water pressure, the bladder was filled to equilibrium and kept distended for 5 minutes. The bladder was then drained with 700 mL volume obtained. Repeat cystoscopy was remarkable for glomerulations on the lateral wall, posterior wall and trigone. The bladder was re-distended in a similar fashion and kept distended for 5 minutes. Upon drainage, the effluent was blood-tinged. Volume was 750 mL. Repeat cystoscopy demonstrates no other  abnormalities than those previously identified. The scope was removed. A B and O suppository was placed per rectum.  The patient was taken to the PACU in stable condition. There were no complications. EBL was minimal.   ____________________________ Ronda Fairly. Bernardo Heater, MD scs:cbb D: 01/08/2012 12:21:09 ET T: 01/08/2012 13:02:26 ET JOB#: [id number]  cc: Nicki Reaper C. Bernardo Heater, MD, <Dictator> Abbie Sons MD ELECTRONICALLY SIGNED 01/23/2012 18:46

## 2015-02-02 DIAGNOSIS — R1013 Epigastric pain: Secondary | ICD-10-CM | POA: Insufficient documentation

## 2015-02-07 ENCOUNTER — Encounter: Payer: Self-pay | Admitting: *Deleted

## 2015-02-16 ENCOUNTER — Ambulatory Visit: Payer: BLUE CROSS/BLUE SHIELD | Admitting: General Surgery

## 2015-02-21 ENCOUNTER — Encounter: Payer: Self-pay | Admitting: General Surgery

## 2015-02-22 ENCOUNTER — Encounter: Payer: Self-pay | Admitting: General Surgery

## 2015-02-22 ENCOUNTER — Ambulatory Visit (INDEPENDENT_AMBULATORY_CARE_PROVIDER_SITE_OTHER): Payer: BLUE CROSS/BLUE SHIELD | Admitting: General Surgery

## 2015-02-22 VITALS — BP 130/74 | HR 74 | Resp 12 | Ht 68.0 in | Wt 177.0 lb

## 2015-02-22 DIAGNOSIS — R2231 Localized swelling, mass and lump, right upper limb: Secondary | ICD-10-CM
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
## 2015-02-22 NOTE — Patient Instructions (Addendum)
Keep area clean Ice pack to reduce swelling

## 2015-02-23 DIAGNOSIS — R2230 Localized swelling, mass and lump, unspecified upper limb: Secondary | ICD-10-CM | POA: Insufficient documentation

## 2015-02-24 ENCOUNTER — Telehealth: Payer: Self-pay

## 2015-02-24 NOTE — Telephone Encounter (Signed)
-----   Message from Robert Bellow, MD sent at 02/24/2015 10:04 AM EDT ----- Please notify the patient that the pathology report confirmed evidence of a thrombosed blood vessel. No additional treatment required. Follow-up for wound evaluation as previously scheduled. Thank you  ----- Message -----    From: [facility]    Sent: 02/23/2015   5:35 PM      To: Robert Bellow, MD

## 2015-03-01 ENCOUNTER — Ambulatory Visit: Payer: BLUE CROSS/BLUE SHIELD

## 2015-03-02 ENCOUNTER — Other Ambulatory Visit: Payer: Self-pay

## 2015-03-02 ENCOUNTER — Ambulatory Visit: Payer: BLUE CROSS/BLUE SHIELD | Admitting: *Deleted

## 2015-03-02 DIAGNOSIS — R2231 Localized swelling, mass and lump, right upper limb: Secondary | ICD-10-CM

## 2015-03-02 MED ORDER — EPINEPHRINE 0.3 MG/0.3ML IJ SOAJ: 0.3000 mg | Freq: Once | INTRAMUSCULAR | Status: DC

## 2015-03-02 NOTE — Telephone Encounter (Signed)
She needs a refill on her epi pen.

## 2015-03-02 NOTE — Telephone Encounter (Signed)
Notified patient as instructed, patient pleased. Follow up as needed.

## 2015-03-02 NOTE — Progress Notes (Signed)
Patient came in today for a wound check.  The wound is clean, with no signs of infection noted. .Follow up as scheduled.  

## 2015-03-02 NOTE — Patient Instructions (Signed)
The patient is aware to call back for any questions or concerns.  

## 2015-03-24 ENCOUNTER — Ambulatory Visit (INDEPENDENT_AMBULATORY_CARE_PROVIDER_SITE_OTHER): Payer: BLUE CROSS/BLUE SHIELD | Admitting: Family Medicine

## 2015-03-24 ENCOUNTER — Encounter: Payer: Self-pay | Admitting: Family Medicine

## 2015-03-24 VITALS — BP 128/87 | HR 68 | Temp 97.7°F | Ht 67.1 in | Wt 174.4 lb

## 2015-03-24 DIAGNOSIS — G5600 Carpal tunnel syndrome, unspecified upper limb: Secondary | ICD-10-CM | POA: Insufficient documentation

## 2015-03-24 DIAGNOSIS — G5601 Carpal tunnel syndrome, right upper limb: Secondary | ICD-10-CM

## 2015-03-24 MED ORDER — B & B CARPAL TUNNEL BRACE MISC: Status: DC

## 2015-03-24 MED ORDER — DICLOFENAC SODIUM 1 % TD GEL: 2.0000 g | Freq: Four times a day (QID) | TRANSDERMAL | Status: DC

## 2015-03-24 NOTE — Progress Notes (Signed)
BP 128/87 mmHg  Pulse 68  Temp(Src) 97.7 F (36.5 C)  Ht 5' 7.1" (1.704 m)  Wt 174 lb 6.4 oz (79.107 kg)  BMI 27.24 kg/m2  SpO2 100%   Subjective:    Patient ID: Tina Harvey, female    DOB: 1971-04-19, 44 y.o.   MRN: [id number]  HPI: Tina Harvey is a 44 y.o. female  Chief Complaint  Patient presents with  . Hand Pain    unsure if it is from past injuries to her shoulder , mainly rt hand, left at times.  . Jaw Pain    left side and numbness on left with a horrible migraine   HAND PAIN- pinky and thumb go numb. 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  Duration: couple of months- getting worse Involved hand: right Mechanism of injury: unknown Location: palmar radial and feels really tight Onset: gradual Severity: moderate  Quality: aching Frequency: constant Radiation: no Aggravating factors: work  Alleviating factors: ice biofreeze Treatments attempted: can't take NSAIDs Relief with NSAIDs?: No NSAIDs Taken Weakness: yes Numbness: yes Redness: no Swelling:no Bruising: no Fevers: no   Relevant past medical, surgical, family and social history reviewed and updated as indicated. Interim medical history since our last visit reviewed. Allergies and medications reviewed and updated.  Review of Systems  Constitutional: Negative.   Respiratory: Negative.   Cardiovascular: Negative.   Musculoskeletal: Negative.   Neurological: Negative.   Psychiatric/Behavioral: Negative.     Per HPI unless specifically indicated above     Objective:    BP 128/87 mmHg  Pulse 68  Temp(Src) 97.7 F (36.5 C)  Ht 5' 7.1" (1.704 m)  Wt 174 lb 6.4 oz (79.107 kg)  BMI 27.24 kg/m2  SpO2 100%  Wt Readings from Last 3 Encounters:  03/24/15 174 lb 6.4 oz (79.107 kg)  02/22/15 177 lb (80.287 kg)  12/08/14 184 lb (83.462 kg)    Physical Exam  Constitutional: She is oriented to person, place, and time. She appears well-developed and well-nourished. No  distress.  HENT:  Head: Normocephalic and atraumatic.  Right Ear: Hearing normal.  Left Ear: Hearing normal.  Nose: Nose normal.  Eyes: Conjunctivae and lids are normal. Right eye exhibits no discharge. Left eye exhibits no discharge. No scleral icterus.  Cardiovascular: Normal rate, regular rhythm and normal heart sounds.  Exam reveals no gallop and no friction rub.   No murmur heard. Pulmonary/Chest: Effort normal. No respiratory distress. She has no wheezes. She has no rales. She exhibits no tenderness.  Musculoskeletal: Normal range of motion.  Neurological: She is alert and oriented to person, place, and time. She has normal strength and normal reflexes.  Reflex Scores:      Tricep reflexes are 2+ on the right side and 2+ on the left side.      Bicep reflexes are 2+ on the right side and 2+ on the left side.      Brachioradialis reflexes are 2+ on the right side and 2+ on the left side. Decreased sensation over C4-6 on the R, + tinnels on the R, + phalens   Skin: Skin is intact. No rash noted.  Psychiatric: She has a normal mood and affect. Her speech is normal and behavior is normal. Judgment and thought content normal. Cognition and memory are normal.      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  Will start voltaren as she can't take NSAIDs due to bypass. Will also start using a brace. Monitor. If not getting better, will refer to neurology for EMG. Continue to monitor. Call with any concerns if she gets better or worse.           Follow up plan: Return for As scheduled for PE.

## 2015-03-24 NOTE — Assessment & Plan Note (Addendum)
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  Will start voltaren as she can't take NSAIDs due to bypass. Will also start using a brace. Exercises given today. Monitor. If not getting better, will refer to neurology for EMG. Continue to monitor. Call with any concerns if she gets better or worse.

## 2015-03-24 NOTE — Patient Instructions (Signed)
Carpal Tunnel Syndrome °Carpal tunnel syndrome is a disorder of the nervous system in the wrist that causes pain, hand weakness, and/or loss of feeling. Carpal tunnel syndrome is caused by the compression, stretching, or irritation of the median nerve at the wrist joint. Athletes who experience carpal tunnel syndrome may notice a decrease in their performance to the condition, especially for sports that require strong hand or wrist action.  °SYMPTOMS  °· Tingling, numbness, or burning pain in the hand or fingers. °· Inability to sleep due to pain in the hand. °· Sharp pains that shoot from the wrist up the arm or to the fingers, especially at night. °· Morning stiffness or cramping of the hand. °· Thumb weakness, resulting in difficulty holding objects or making a fist. °· Shiny, dry skin on the hand. °· Reduced performance in any sport requiring a strong grip. °CAUSES  °· Median nerve damage at the wrist is caused by pressure due to swelling, inflammation, or scarred tissue. °· Sources of pressure include: °¨ Repetitive gripping or squeezing that causes inflammation of the tendon sheaths. °¨ Scarring or shortening of the ligament that covers the median nerve. °¨ Traumatic injury to the wrist or forearm such as fracture, sprain, or dislocation. °¨ Prolonged hyperextension (wrist bent backward) or hyperflexion (wrist bent downward) of the wrist. °RISK INCREASES WITH: °· Diabetes mellitus. °· Menopause or amenorrhea. °· Rheumatoid arthritis. °· Raynaud disease. °· Pregnancy. °· Gout. °· Kidney disease. °· Ganglion cyst. °· Repetitive hand or wrist action. °· Hypothyroidism (underactive thyroid gland). °· Repetitive jolting or shaking of the hands or wrist. °· Prolonged forceful weight-bearing on the hands. °PREVENTION °· Bracing the hand and wrist straight during activities that involve repetitive grasping. °· For activities that require prolonged extension of the wrist (bending towards the top of the forearm)  periodically change the position of your wrists. °· Learn and use proper technique in activities that result in the wrist position in neutral to slight extension. °· Avoid bending the wrist into full extension or flexion (up or down). °· Keep the wrist in a straight (neutral) position. To keep the wrist in this position, wear a splint. °· Avoid repetitive hand and wrist motions. °· When possible avoid prolonged grasping of items (steering wheel of a car, a pen, a vacuum cleaner, or a rake). °· Loosen your grip for activities that require prolonged grasping of items. °· Place keyboards and writing surfaces at the correct height as to decrease strain on the wrist and hand. °· Alternate work tasks to avoid prolonged wrist flexion. °· Avoid pinching activities (needlework and writing) as they may irritate your carpal tunnel syndrome. °· If these activities are necessary, complete them for shorter periods of time. °· When writing, use a felt tip or rollerball pen and/or build up the grip on a pen to decrease the forces required for writing. °PROGNOSIS  °Carpal tunnel syndrome is usually curable with appropriate conservative treatment and sometimes resolves spontaneously. For some cases, surgery is necessary, especially if muscle wasting or nerve changes have developed.  °RELATED COMPLICATIONS  °· Permanent numbness and a weak thumb or fingers in the affected hand. °· Permanent paralysis of a portion of the hand and fingers. °TREATMENT  °Treatment initially consists of stopping activities that aggravate the symptoms as well as medication and ice to reduce inflammation. A wrist splint is often recommended for wear during activities of repetitive motion as well as at night. It is also important to learn and use proper technique when   performing activities that typically cause pain. On occasion, a corticosteroid injection may be given. °If symptoms persist despite conservative treatment, surgery may be an option. Surgical  techniques free the pinched or compressed nerve. Carpal tunnel surgery is usually performed on an outpatient basis, meaning you go home the same day as surgery. These procedures provide almost complete relief of all symptoms in 95% of patients. Expect at least 2 weeks for healing after surgery. For cases that are the result of repeated jolting or shaking of the hand or wrist or prolonged hyperextension, surgery is not usually recommended because stretching of the median nerve, not compression, is usually the cause of carpal tunnel syndrome in these cases. °MEDICATION  °· If pain medication is necessary, nonsteroidal anti-inflammatory medications, such as aspirin and ibuprofen, or other minor pain relievers, such as acetaminophen, are often recommended. °· Do not take pain medication for 7 days before surgery. °· Prescription pain relievers are usually only prescribed after surgery. Use only as directed and only as much as you need. °· Corticosteroid injections may be given to reduce inflammation. However, they are not always recommended. °· Vitamin B6 (pyridoxine) may reduce symptoms; use only if prescribed for your disorder. °SEEK MEDICAL CARE IF:  °· Symptoms get worse or do not improve in 2 weeks despite treatment. °· You also have a current or recent history of neck or shoulder injury that has resulted in pain or tingling elsewhere in your arm. °Document Released: 09/16/2005 Document Revised: 01/31/2014 Document Reviewed: 12/29/2008 °[employer]® Patient Information ©2015 [employer]. This information is not intended to replace advice given to you by your health care provider. Make sure you discuss any questions you have with your health care provider. ° °

## 2015-04-06 ENCOUNTER — Other Ambulatory Visit
Admission: RE | Admit: 2015-04-06 | Discharge: 2015-04-06 | Disposition: A | Discharge status: Home / Self Care | Payer: BLUE CROSS/BLUE SHIELD | Source: Ambulatory Visit | Attending: Bariatrics | Admitting: Bariatrics

## 2015-04-06 DIAGNOSIS — Z9884 Bariatric surgery status: Secondary | ICD-10-CM | POA: Insufficient documentation

## 2015-04-06 LAB — CBC WITH DIFFERENTIAL/PLATELET
BASOS ABS: 0 10*3/uL (ref 0–0.1)
BASOS PCT: 1 %
Eosinophils Absolute: 0.3 10*3/uL (ref 0–0.7)
Eosinophils Relative: 5 %
HCT: 38 % (ref 35.0–47.0)
Hemoglobin: 12.5 g/dL (ref 12.0–16.0)
Lymphocytes Relative: 37 %
Lymphs Abs: 2 10*3/uL (ref 1.0–3.6)
MCH: 27.9 pg (ref 26.0–34.0)
MCHC: 32.9 g/dL (ref 32.0–36.0)
MCV: 84.8 fL (ref 80.0–100.0)
Monocytes Absolute: 0.4 10*3/uL (ref 0.2–0.9)
Monocytes Relative: 8 %
NEUTROS ABS: 2.7 10*3/uL (ref 1.4–6.5)
Neutrophils Relative %: 49 %
Platelets: 218 10*3/uL (ref 150–440)
RBC: 4.48 MIL/uL (ref 3.80–5.20)
RDW: 13.7 % (ref 11.5–14.5)
WBC: 5.3 10*3/uL (ref 3.6–11.0)

## 2015-04-06 LAB — COMPREHENSIVE METABOLIC PANEL
ALBUMIN: 3.8 g/dL (ref 3.5–5.0)
ALK PHOS: 68 U/L (ref 38–126)
ALT: 13 U/L — ABNORMAL LOW (ref 14–54)
AST: 20 U/L (ref 15–41)
Anion gap: 7 (ref 5–15)
BUN: 15 mg/dL (ref 6–20)
CO2: 30 mmol/L (ref 22–32)
Calcium: 9.4 mg/dL (ref 8.9–10.3)
Chloride: 105 mmol/L (ref 101–111)
Creatinine, Ser: 0.72 mg/dL (ref 0.44–1.00)
GFR calc Af Amer: >60 mL/min (ref >60)
Glucose, Bld: 86 mg/dL (ref 65–99)
Potassium: 3.9 mmol/L (ref 3.5–5.1)
SODIUM: 142 mmol/L (ref 135–145)
Total Bilirubin: 0.5 mg/dL (ref 0.3–1.2)
Total Protein: 7.8 g/dL (ref 6.5–8.1)

## 2015-04-06 LAB — HEMOGLOBIN A1C: Hgb A1c MFr Bld: 5 % (ref 4.0–6.0)

## 2015-04-06 LAB — IRON AND TIBC
Iron: 73 ug/dL (ref 28–170)
SATURATION RATIOS: 19 % (ref 10.4–31.8)
TIBC: 389 ug/dL (ref 250–450)
UIBC: 316 ug/dL

## 2015-04-06 LAB — TSH: TSH: 1.651 u[IU]/mL (ref 0.350–4.500)

## 2015-04-06 LAB — PREALBUMIN: Prealbumin: 20.6 mg/dL (ref 18–38)

## 2015-04-06 LAB — VITAMIN B12: Vitamin B-12: 395 pg/mL (ref 180–914)

## 2015-04-06 LAB — MAGNESIUM: Magnesium: 2.2 mg/dL (ref 1.7–2.4)

## 2015-04-06 LAB — FERRITIN: FERRITIN: 41 ng/mL (ref 11–307)

## 2015-04-06 LAB — FOLATE: FOLATE: 4.8 ng/mL — AB (ref >5.9)

## 2015-04-08 LAB — VITAMIN D PNL(25-HYDRXY+1,25-DIHY)-BLD
Vit D, 1,25-Dihydroxy: 57.7 pg/mL (ref 19.9–79.3)
Vit D, 25-Hydroxy: 27.3 ng/mL — ABNORMAL LOW (ref 30.0–100.0)

## 2015-04-08 LAB — VITAMIN E: ALPHA-TOCOPHEROL: 11.3 mg/L (ref 5.3–16.8)

## 2015-04-08 LAB — PARATHYROID HORMONE, INTACT (NO CA): PTH: 23 pg/mL (ref 15–65)

## 2015-04-08 LAB — COPPER, SERUM

## 2015-04-08 LAB — VITAMIN B1: VITAMIN B1 (THIAMINE): 157 nmol/L (ref 66.5–200.0)

## 2015-04-11 LAB — ZINC: Zinc: 71 ug/dL (ref 56–134)

## 2015-04-11 LAB — VITAMIN A: Vitamin A (Retinoic Acid): 39 ug/dL (ref 20–65)

## 2015-04-11 LAB — COPPER, SERUM: COPPER: 102 ug/dL (ref 72–166)

## 2015-04-21 ENCOUNTER — Ambulatory Visit (INDEPENDENT_AMBULATORY_CARE_PROVIDER_SITE_OTHER): Payer: BLUE CROSS/BLUE SHIELD | Admitting: Family Medicine

## 2015-04-21 ENCOUNTER — Encounter: Payer: Self-pay | Admitting: Family Medicine

## 2015-04-21 VITALS — BP 145/92 | HR 68 | Temp 97.8°F | Wt 172.6 lb

## 2015-04-21 DIAGNOSIS — E559 Vitamin D deficiency, unspecified: Secondary | ICD-10-CM

## 2015-04-21 DIAGNOSIS — I10 Essential (primary) hypertension: Secondary | ICD-10-CM | POA: Diagnosis not present

## 2015-04-21 DIAGNOSIS — M10079 Idiopathic gout, unspecified ankle and foot: Secondary | ICD-10-CM

## 2015-04-21 DIAGNOSIS — Z113 Encounter for screening for infections with a predominantly sexual mode of transmission: Secondary | ICD-10-CM

## 2015-04-21 DIAGNOSIS — M109 Gout, unspecified: Secondary | ICD-10-CM

## 2015-04-21 DIAGNOSIS — M7021 Olecranon bursitis, right elbow: Secondary | ICD-10-CM | POA: Diagnosis not present

## 2015-04-21 NOTE — Assessment & Plan Note (Addendum)
Reinforce the DASH guidelines; she says she cannot take anything for her BP, does not think it's an issue, says it's just family stress; she will try meditation; no family stress or event is worth a stroke

## 2015-04-21 NOTE — Progress Notes (Signed)
BP 145/92 mmHg  Pulse 68  Temp(Src) 97.8 F (36.6 C)  Wt 172 lb 9.6 oz (78.291 kg)  SpO2 97%   Subjective:    Patient ID: Tina Harvey, female    DOB: 16-Feb-1971, 44 y.o.   MRN: [id number]  HPI: Tina Harvey is a 44 y.o. female  Chief Complaint  Patient presents with  . Elbow Pain    right elbow, was swollen, has started to go down.   . vitamin d    pt would like to have her vitamin d checked, she thinks that she needs prescription strength   She has had right elbow pain for about a week; no injury recalled; nothing similar in the past; red area and swelling below the olecranon; using ice 15 minutes or so 3-4 x a day Cannot take NSAIDs Patient does have a hx of gout Does eat a lot of chicken and poultry  High blood pressure today; stress lately; family issues; not taking anything  Interested in HIV test for screening; no new partners; has open marriage  Wanted her vitamin D level checked; this was actually just done through her bariatric surgeon (reviewed, 27.3)  Relevant past medical, surgical, family and social history reviewed and updated as indicated. Interim medical history since our last visit reviewed. Allergies and medications reviewed and updated.  Review of Systems Per HPI unless specifically indicated above     Objective:    BP 145/92 mmHg  Pulse 68  Temp(Src) 97.8 F (36.6 C)  Wt 172 lb 9.6 oz (78.291 kg)  SpO2 97%  Wt Readings from Last 3 Encounters:  04/21/15 172 lb 9.6 oz (78.291 kg)  03/24/15 174 lb 6.4 oz (79.107 kg)  02/22/15 177 lb (80.287 kg)    Physical Exam  Constitutional: She appears well-developed and well-nourished.  overweight  Eyes: No scleral icterus.  Cardiovascular: Normal rate.   Pulmonary/Chest: Effort normal.  Musculoskeletal:       Right elbow: She exhibits swelling. She exhibits normal range of motion, no effusion and no deformity. Tenderness found. Olecranon process (with erythema and slight swelling) tenderness noted.   Psychiatric: She has a normal mood and affect. Thought content normal. Cognition and memory are normal.    Results for orders placed or performed during the hospital encounter of 04/06/15  Iron and TIBC  Result Value Ref Range   Iron 73 28 - 170 ug/dL   TIBC 389 250 - 450 ug/dL   Saturation Ratios 19 10.4 - 31.8 %   UIBC 316 ug/dL  Ferritin  Result Value Ref Range   Ferritin 41 11 - 307 ng/mL  Magnesium  Result Value Ref Range   Magnesium 2.2 1.7 - 2.4 mg/dL  Vitamin D pnl(25-hydrxy+1,25-dihy)-bld  Result Value Ref Range   Vit D, 1,25-Dihydroxy 57.7 19.9 - 79.3 pg/mL   Vit D, 25-Hydroxy 27.3 (L) 30.0 - 100.0 ng/mL  Prealbumin  Result Value Ref Range   Prealbumin 20.6 18 - 38 mg/dL  Vitamin B1  Result Value Ref Range   Vitamin B1 (Thiamine) 157.0 66.5 - 200.0 nmol/L  Vitamin B12  Result Value Ref Range   Vitamin B-12 395 180 - 914 pg/mL  Folate  Result Value Ref Range   Folate 4.8 (L) >5.9 ng/mL  Comprehensive metabolic panel  Result Value Ref Range   Sodium 142 135 - 145 mmol/L   Potassium 3.9 3.5 - 5.1 mmol/L   Chloride 105 101 - 111 mmol/L   CO2 30 22 - 32 mmol/L  Glucose, Bld 86 65 - 99 mg/dL   BUN 15 6 - 20 mg/dL   Creatinine, Ser 0.72 0.44 - 1.00 mg/dL   Calcium 9.4 8.9 - 10.3 mg/dL   Total Protein 7.8 6.5 - 8.1 g/dL   Albumin 3.8 3.5 - 5.0 g/dL   AST 20 15 - 41 U/L   ALT 13 (L) 14 - 54 U/L   Alkaline Phosphatase 68 38 - 126 U/L   Total Bilirubin 0.5 0.3 - 1.2 mg/dL   GFR calc non Af Amer >60 >60 mL/min   GFR calc Af Amer >60 >60 mL/min   Anion gap 7 5 - 15  CBC with Differential/Platelet  Result Value Ref Range   WBC 5.3 3.6 - 11.0 K/uL   RBC 4.48 3.80 - 5.20 MIL/uL   Hemoglobin 12.5 12.0 - 16.0 g/dL   HCT 38.0 35.0 - 47.0 %   MCV 84.8 80.0 - 100.0 fL   MCH 27.9 26.0 - 34.0 pg   MCHC 32.9 32.0 - 36.0 g/dL   RDW 13.7 11.5 - 14.5 %   Platelets 218 150 - 440 K/uL   Neutrophils Relative % 49 %   Neutro Abs 2.7 1.4 - 6.5 K/uL   Lymphocytes Relative  37 %   Lymphs Abs 2.0 1.0 - 3.6 K/uL   Monocytes Relative 8 %   Monocytes Absolute 0.4 0.2 - 0.9 K/uL   Eosinophils Relative 5 %   Eosinophils Absolute 0.3 0 - 0.7 K/uL   Basophils Relative 1 %   Basophils Absolute 0.0 0 - 0.1 K/uL  Vitamin A  Result Value Ref Range   Vitamin A (Retinoic Acid) 39 20 - 65 ug/dL  Vitamin E  Result Value Ref Range   Alpha-Tocopherol 11.3 5.3 - 16.8 mg/L  Copper, serum  Result Value Ref Range   Copper REJ5 ug/dL  TSH  Result Value Ref Range   TSH 1.651 0.350 - 4.500 uIU/mL  Hemoglobin A1c  Result Value Ref Range   Hgb A1c MFr Bld 5.0 4.0 - 6.0 %  Parathyroid hormone, intact (no Ca)  Result Value Ref Range   PTH 23 15 - 65 pg/mL  Copper, serum  Result Value Ref Range   Copper 102 72 - 166 ug/dL  Zinc  Result Value Ref Range   Zinc 71 56 - 134 ug/dL      Assessment & Plan:   Problem List Items Addressed This Visit      Cardiovascular and Mediastinum   Essential hypertension, benign    Reinforce the DASH guidelines; she says she cannot take anything for her BP, does not think it's an issue, says it's just family stress; she will try meditation; no family stress or event is worth a stroke        Other   Gout    I don't think today's episode is gout, but it is possible in that location; to help prevent future episodes, I encouraged her to eat non-animal sources of protein, pointing out purine-rich nature of chicken soup, [place], gravies, etc; check uric acid today      Relevant Orders   Uric acid   Screen for STD (sexually transmitted disease)    Check HIV today; she declined other testing      Relevant Orders   HIV antibody   Vitamin D deficiency    Level just mildly low; encouraged her to review with her bariatric surgeon, get instructions on dosing from him/her       Other Visit  Diagnoses    Olecranon bursitis of right elbow    -  Primary    avoid pressure; hand-out in after visit summary given        Follow up plan: No  Follow-up on file.

## 2015-04-21 NOTE — Assessment & Plan Note (Signed)
Level just mildly low; encouraged her to review with her bariatric surgeon, get instructions on dosing from him/her

## 2015-04-21 NOTE — Patient Instructions (Addendum)
We'll check labs and let you know the results Try non-animal sources of protein Avoid pressure / contact over the elbow  DASH Eating Plan DASH stands for "Dietary Approaches to Stop Hypertension." The DASH eating plan is a healthy eating plan that has been shown to reduce high blood pressure (hypertension). Additional health benefits may include reducing the risk of type 2 diabetes mellitus, heart disease, and stroke. The DASH eating plan may also help with weight loss. WHAT DO I NEED TO KNOW ABOUT THE DASH EATING PLAN? For the DASH eating plan, you will follow these general guidelines:  Choose foods with a percent daily value for sodium of less than 5% (as listed on the food label).  Use salt-free seasonings or herbs instead of table salt or sea salt.  Check with your health care provider or pharmacist before using salt substitutes.  Eat lower-sodium products, often labeled as "lower sodium" or "no salt added."  Eat fresh foods.  Eat more vegetables, fruits, and low-fat dairy products.  Choose whole grains. Look for the word "whole" as the first word in the ingredient list.  Choose fish and skinless chicken or [place] more often than red meat. Limit fish, poultry, and meat to 6 oz (170 g) each day.  Limit sweets, desserts, sugars, and sugary drinks.  Choose heart-healthy fats.  Limit cheese to 1 oz (28 g) per day.  Eat more home-cooked food and less restaurant, buffet, and fast food.  Limit fried foods.  Cook foods using methods other than frying.  Limit canned vegetables. If you do use them, rinse them well to decrease the sodium.  When eating at a restaurant, ask that your food be prepared with less salt, or no salt if possible. WHAT FOODS CAN I EAT? Seek help from a dietitian for individual calorie needs. Grains Whole grain or whole wheat bread. Brown rice. Whole grain or whole wheat pasta. Quinoa, bulgur, and whole grain cereals. Low-sodium cereals. Corn or whole wheat  flour tortillas. Whole grain cornbread. Whole grain crackers. Low-sodium crackers. Vegetables Fresh or frozen vegetables (raw, steamed, roasted, or grilled). Low-sodium or reduced-sodium tomato and vegetable juices. Low-sodium or reduced-sodium tomato sauce and paste. Low-sodium or reduced-sodium canned vegetables.  Fruits All fresh, canned (in natural juice), or frozen fruits. Meat and Other Protein Products Ground beef (85% or leaner), grass-fed beef, or beef trimmed of fat. Skinless chicken or [place]. Ground chicken or [place]. Pork trimmed of fat. All fish and seafood. Eggs. Dried beans, peas, or lentils. Unsalted nuts and seeds. Unsalted canned beans. Dairy Low-fat dairy products, such as skim or 1% milk, 2% or reduced-fat cheeses, low-fat ricotta or cottage cheese, or plain low-fat yogurt. Low-sodium or reduced-sodium cheeses. Fats and Oils Tub margarines without trans fats. Light or reduced-fat mayonnaise and salad dressings (reduced sodium). Avocado. Safflower, olive, or canola oils. Natural peanut or almond butter. Other Unsalted popcorn and pretzels. The items listed above may not be a complete list of recommended foods or beverages. Contact your dietitian for more options. WHAT FOODS ARE NOT RECOMMENDED? Grains White bread. White pasta. White rice. Refined cornbread. Bagels and croissants. Crackers that contain trans fat. Vegetables Creamed or fried vegetables. Vegetables in a cheese sauce. Regular canned vegetables. Regular canned tomato sauce and paste. Regular tomato and vegetable juices. Fruits Dried fruits. Canned fruit in light or heavy syrup. Fruit juice. Meat and Other Protein Products Fatty cuts of meat. Ribs, chicken wings, bacon, sausage, bologna, salami, chitterlings, fatback, hot dogs, bratwurst, and packaged luncheon meats. Salted  nuts and seeds. Canned beans with salt. Dairy Whole or 2% milk, cream, half-and-half, and cream cheese. Whole-fat or sweetened yogurt.  Full-fat cheeses or blue cheese. Nondairy creamers and whipped toppings. Processed cheese, cheese spreads, or cheese curds. Condiments Onion and garlic salt, seasoned salt, table salt, and sea salt. Canned and packaged gravies. Worcestershire sauce. Tartar sauce. Barbecue sauce. Teriyaki sauce. Soy sauce, including reduced sodium. Steak sauce. Fish sauce. Oyster sauce. Cocktail sauce. Horseradish. Ketchup and mustard. Meat flavorings and tenderizers. Bouillon cubes. Hot sauce. Tabasco sauce. Marinades. Taco seasonings. Relishes. Fats and Oils Butter, stick margarine, lard, shortening, ghee, and bacon fat. Coconut, palm kernel, or palm oils. Regular salad dressings. Other Pickles and olives. Salted popcorn and pretzels. The items listed above may not be a complete list of foods and beverages to avoid. Contact your dietitian for more information. WHERE CAN I FIND MORE INFORMATION? National Heart, Lung, and Blood Institute: travelstabloid.com Document Released: 09/05/2011 Document Revised: 01/31/2014 Document Reviewed: 07/21/2013 [facility] Patient Information 2015 [place], [place]. This information is not intended to replace advice given to you by your health care provider. Make sure you discuss any questions you have with your health care provider. Bursitis Bursitis is a swelling and soreness (inflammation) of a fluid-filled sac (bursa) that overlies and protects a joint. It can be caused by injury, overuse of the joint, arthritis or infection. The joints most likely to be affected are the elbows, shoulders, hips and knees. HOME CARE INSTRUCTIONS   Apply ice to the affected area for 15-20 minutes each hour while awake for 2 days. Put the ice in a plastic bag and place a towel between the bag of ice and your skin.  Rest the injured joint as much as possible, but continue to put the joint through a full range of motion, 4 times per day. (The shoulder joint especially  becomes rapidly "frozen" if not used.) When the pain lessens, begin normal slow movements and usual activities.  Only take over-the-counter or prescription medicines for pain, discomfort or fever as directed by your caregiver.  Your caregiver may recommend draining the bursa and injecting medicine into the bursa. This may help the healing process.  Follow all instructions for follow-up with your caregiver. This includes any orthopedic referrals, physical therapy and rehabilitation. Any delay in obtaining necessary care could result in a delay or failure of the bursitis to heal and chronic pain. SEEK IMMEDIATE MEDICAL CARE IF:   Your pain increases even during treatment.  You develop an oral temperature above 102 F (38.9 C) and have heat and inflammation over the involved bursa. MAKE SURE YOU:   Understand these instructions.  Will watch your condition.  Will get help right away if you are not doing well or get worse. Document Released: 09/13/2000 Document Revised: 12/09/2011 Document Reviewed: 12/06/2013 [facility] Patient Information 2015 [place], [place]. This information is not intended to replace advice given to you by your health care provider. Make sure you discuss any questions you have with your health care provider.

## 2015-04-21 NOTE — Assessment & Plan Note (Signed)
I don't think today's episode is gout, but it is possible in that location; to help prevent future episodes, I encouraged her to eat non-animal sources of protein, pointing out purine-rich nature of chicken soup, [place], gravies, etc; check uric acid today

## 2015-04-21 NOTE — Assessment & Plan Note (Signed)
Check HIV today; she declined other testing

## 2015-04-22 ENCOUNTER — Encounter: Payer: Self-pay | Admitting: Family Medicine

## 2015-04-22 LAB — URIC ACID: Uric Acid: 4.4 mg/dL (ref 2.5–7.1)

## 2015-04-22 LAB — HIV ANTIBODY (ROUTINE TESTING W REFLEX): HIV Screen 4th Generation wRfx: NONREACTIVE

## 2015-04-25 ENCOUNTER — Ambulatory Visit: Payer: BLUE CROSS/BLUE SHIELD | Admitting: Family Medicine

## 2015-05-12 ENCOUNTER — Other Ambulatory Visit: Payer: Self-pay

## 2015-05-12 DIAGNOSIS — E538 Deficiency of other specified B group vitamins: Secondary | ICD-10-CM

## 2015-05-12 MED ORDER — CYANOCOBALAMIN 1000 MCG/ML IJ SOLN: 1000.0000 ug | Freq: Once | INTRAMUSCULAR | Status: DC

## 2015-05-12 MED ORDER — "SYRINGE/NEEDLE (DISP) 25G X 1"" 1 ML MISC": Status: DC

## 2015-05-12 NOTE — Telephone Encounter (Signed)
Patient needs a refill on Vitamin B 12 and needles/syringes. Order placed, please review, sign and send

## 2015-05-22 LAB — MISC LABCORP TEST (SEND OUT): Labcorp test code: 121200

## 2015-06-01 HISTORY — PX: PANNICULECTOMY: SUR1001

## 2015-06-09 ENCOUNTER — Telehealth: Payer: Self-pay | Admitting: Family Medicine

## 2015-06-09 NOTE — Telephone Encounter (Signed)
Infected belly button due to all the lose skin from bypass surgery.  Pt states Dr [name] told her she'd call in an antibiotic if this happened again.  She said she has seen Dr Sanda Klein previously and wanted to ask if Dr Sanda Klein would do this for her.

## 2015-06-09 NOTE — Telephone Encounter (Signed)
Routing to provider  
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
## 2015-06-11 NOTE — Telephone Encounter (Signed)
Need to know if candidiasis-type symptoms or cellulitis Patient needs appt or see if Dr. [name] will address on Sunday/Monday

## 2015-06-12 MED ORDER — DOXYCYCLINE HYCLATE 100 MG PO TABS: 100.0000 mg | ORAL_TABLET | Freq: Two times a day (BID) | ORAL | Status: DC

## 2015-06-12 NOTE — Telephone Encounter (Signed)
Aware of situation, cellulitis. Will call in doxycycline to patient, please let her know. Thanks!

## 2015-06-12 NOTE — Telephone Encounter (Signed)
Patient notified

## 2015-06-19 ENCOUNTER — Ambulatory Visit (INDEPENDENT_AMBULATORY_CARE_PROVIDER_SITE_OTHER): Payer: BLUE CROSS/BLUE SHIELD | Admitting: Family Medicine

## 2015-06-19 ENCOUNTER — Encounter: Payer: Self-pay | Admitting: Family Medicine

## 2015-06-19 VITALS — BP 138/70 | HR 100 | Temp 97.4°F | Wt 169.0 lb

## 2015-06-19 DIAGNOSIS — Z1239 Encounter for other screening for malignant neoplasm of breast: Secondary | ICD-10-CM

## 2015-06-19 DIAGNOSIS — N3010 Interstitial cystitis (chronic) without hematuria: Secondary | ICD-10-CM

## 2015-06-19 DIAGNOSIS — Z0000 Encounter for general adult medical examination without abnormal findings: Secondary | ICD-10-CM

## 2015-06-19 DIAGNOSIS — Z23 Encounter for immunization: Secondary | ICD-10-CM

## 2015-06-19 NOTE — Patient Instructions (Signed)
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
## 2015-06-20 ENCOUNTER — Encounter: Payer: BLUE CROSS/BLUE SHIELD | Admitting: Family Medicine

## 2015-06-20 DIAGNOSIS — E65 Localized adiposity: Secondary | ICD-10-CM | POA: Insufficient documentation

## 2015-06-29 ENCOUNTER — Ambulatory Visit: Payer: BLUE CROSS/BLUE SHIELD

## 2015-07-11 ENCOUNTER — Telehealth: Payer: Self-pay

## 2015-07-11 NOTE — Telephone Encounter (Signed)
Erline Levine from Medical records called, he states that you sent patient over there to have a Vitamin A checked in April 06, 2015. So they need a signed order. [phone] Attn: Erline Levine

## 2015-07-11 NOTE — Telephone Encounter (Signed)
Likely her gastric bypass doctor. Can't do in the computer. Order faxed over.

## 2015-08-14 ENCOUNTER — Telehealth: Payer: Self-pay

## 2015-08-14 DIAGNOSIS — Z1239 Encounter for other screening for malignant neoplasm of breast: Secondary | ICD-10-CM

## 2015-08-14 DIAGNOSIS — N63 Unspecified lump in unspecified breast: Secondary | ICD-10-CM

## 2015-08-14 NOTE — Telephone Encounter (Signed)
Patient called, she tried to order her mammogram, but they would not schedule it since something was felt in patients breast. Please order diagnostic mammogram.

## 2015-08-15 NOTE — Telephone Encounter (Signed)
New order put in.

## 2015-08-15 NOTE — Telephone Encounter (Signed)
Patient notified

## 2015-08-16 ENCOUNTER — Encounter: Payer: Self-pay | Admitting: Urology

## 2015-08-16 ENCOUNTER — Ambulatory Visit: Payer: BLUE CROSS/BLUE SHIELD | Admitting: Urology

## 2015-08-16 DIAGNOSIS — N3010 Interstitial cystitis (chronic) without hematuria: Secondary | ICD-10-CM

## 2015-08-16 LAB — URINALYSIS, COMPLETE
BILIRUBIN UA: NEGATIVE
GLUCOSE, UA: NEGATIVE
Ketones, UA: NEGATIVE
NITRITE UA: NEGATIVE
PROTEIN UA: NEGATIVE
SPEC GRAV UA: 1.02 (ref 1.005–1.030)
Urobilinogen, Ur: 0.2 mg/dL (ref 0.2–1.0)
pH, UA: 7 (ref 5.0–7.5)

## 2015-08-16 LAB — BLADDER SCAN AMB NON-IMAGING: Scan Result: 0

## 2015-08-16 LAB — MICROSCOPIC EXAMINATION
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
## 2015-08-17 ENCOUNTER — Telehealth: Payer: Self-pay | Admitting: Radiology

## 2015-08-17 NOTE — Telephone Encounter (Signed)
Notified pt of surgery scheduled 08/28/15, pre-admit testing appt 08/21/15 @2 :00 & to call on 11/23 for arrival time to SDS. Pt voices understanding.

## 2015-08-21 ENCOUNTER — Encounter
Admission: RE | Admit: 2015-08-21 | Discharge: 2015-08-21 | Disposition: A | Discharge status: Home / Self Care | Payer: BLUE CROSS/BLUE SHIELD | Source: Ambulatory Visit | Attending: Urology | Admitting: Urology

## 2015-08-21 DIAGNOSIS — Z9884 Bariatric surgery status: Secondary | ICD-10-CM | POA: Diagnosis not present

## 2015-08-21 DIAGNOSIS — Z87892 Personal history of anaphylaxis: Secondary | ICD-10-CM | POA: Diagnosis not present

## 2015-08-21 DIAGNOSIS — Z87891 Personal history of nicotine dependence: Secondary | ICD-10-CM | POA: Diagnosis not present

## 2015-08-21 DIAGNOSIS — G43009 Migraine without aura, not intractable, without status migrainosus: Secondary | ICD-10-CM | POA: Diagnosis not present

## 2015-08-21 DIAGNOSIS — G4730 Sleep apnea, unspecified: Secondary | ICD-10-CM | POA: Diagnosis not present

## 2015-08-21 DIAGNOSIS — Z01812 Encounter for preprocedural laboratory examination: Secondary | ICD-10-CM | POA: Insufficient documentation

## 2015-08-21 DIAGNOSIS — Z888 Allergy status to other drugs, medicaments and biological substances status: Secondary | ICD-10-CM | POA: Diagnosis not present

## 2015-08-21 DIAGNOSIS — Z882 Allergy status to sulfonamides status: Secondary | ICD-10-CM | POA: Diagnosis not present

## 2015-08-21 DIAGNOSIS — G4700 Insomnia, unspecified: Secondary | ICD-10-CM | POA: Diagnosis not present

## 2015-08-21 DIAGNOSIS — Z880 Allergy status to penicillin: Secondary | ICD-10-CM | POA: Diagnosis not present

## 2015-08-21 DIAGNOSIS — Z9889 Other specified postprocedural states: Secondary | ICD-10-CM | POA: Diagnosis not present

## 2015-08-21 DIAGNOSIS — Z90710 Acquired absence of both cervix and uterus: Secondary | ICD-10-CM | POA: Diagnosis not present

## 2015-08-21 DIAGNOSIS — M797 Fibromyalgia: Secondary | ICD-10-CM | POA: Diagnosis not present

## 2015-08-21 DIAGNOSIS — Z881 Allergy status to other antibiotic agents status: Secondary | ICD-10-CM | POA: Diagnosis not present

## 2015-08-21 DIAGNOSIS — E559 Vitamin D deficiency, unspecified: Secondary | ICD-10-CM | POA: Diagnosis not present

## 2015-08-21 DIAGNOSIS — E785 Hyperlipidemia, unspecified: Secondary | ICD-10-CM | POA: Diagnosis not present

## 2015-08-21 DIAGNOSIS — J309 Allergic rhinitis, unspecified: Secondary | ICD-10-CM | POA: Diagnosis not present

## 2015-08-21 DIAGNOSIS — Z91018 Allergy to other foods: Secondary | ICD-10-CM | POA: Diagnosis not present

## 2015-08-21 DIAGNOSIS — F329 Major depressive disorder, single episode, unspecified: Secondary | ICD-10-CM | POA: Diagnosis not present

## 2015-08-21 DIAGNOSIS — M8580 Other specified disorders of bone density and structure, unspecified site: Secondary | ICD-10-CM | POA: Diagnosis not present

## 2015-08-21 DIAGNOSIS — Z9049 Acquired absence of other specified parts of digestive tract: Secondary | ICD-10-CM | POA: Diagnosis not present

## 2015-08-21 DIAGNOSIS — N3010 Interstitial cystitis (chronic) without hematuria: Secondary | ICD-10-CM | POA: Diagnosis not present

## 2015-08-21 DIAGNOSIS — Z6824 Body mass index (BMI) 24.0-24.9, adult: Secondary | ICD-10-CM | POA: Diagnosis not present

## 2015-08-21 DIAGNOSIS — E669 Obesity, unspecified: Secondary | ICD-10-CM | POA: Diagnosis not present

## 2015-08-21 DIAGNOSIS — M109 Gout, unspecified: Secondary | ICD-10-CM | POA: Diagnosis not present

## 2015-08-21 DIAGNOSIS — Z8541 Personal history of malignant neoplasm of cervix uteri: Secondary | ICD-10-CM | POA: Diagnosis not present

## 2015-08-21 DIAGNOSIS — I119 Hypertensive heart disease without heart failure: Secondary | ICD-10-CM | POA: Diagnosis not present

## 2015-08-21 HISTORY — DX: Unspecified asthma, uncomplicated: J45.909

## 2015-08-21 HISTORY — DX: Anxiety disorder, unspecified: F41.9

## 2015-08-21 LAB — CBC
HEMATOCRIT: 35.2 % (ref 35.0–47.0)
HEMOGLOBIN: 11.4 g/dL — AB (ref 12.0–16.0)
MCH: 26.2 pg (ref 26.0–34.0)
MCHC: 32.4 g/dL (ref 32.0–36.0)
MCV: 80.9 fL (ref 80.0–100.0)
Platelets: 241 10*3/uL (ref 150–440)
RBC: 4.34 MIL/uL (ref 3.80–5.20)
RDW: 13.6 % (ref 11.5–14.5)
WBC: 7 10*3/uL (ref 3.6–11.0)

## 2015-08-21 LAB — DIFFERENTIAL
Basophils Absolute: 0 10*3/uL (ref 0–0.1)
Basophils Relative: 1 %
Eosinophils Absolute: 0.2 10*3/uL (ref 0–0.7)
Eosinophils Relative: 3 %
LYMPHS ABS: 2.8 10*3/uL (ref 1.0–3.6)
LYMPHS PCT: 40 %
Monocytes Absolute: 0.5 10*3/uL (ref 0.2–0.9)
Monocytes Relative: 8 %
NEUTROS PCT: 48 %
Neutro Abs: 3.5 10*3/uL (ref 1.4–6.5)

## 2015-08-21 NOTE — Patient Instructions (Signed)
  Your procedure is scheduled on: August 28, 2015 (Monday) Report to Day Surgery.[facility]) To find out your arrival time please call [phone] between 1PM - 3PM on August 25, 2015(Friday).  Remember: Instructions that are not followed completely may result in serious medical risk, up to and including death, or upon the discretion of your surgeon and anesthesiologist your surgery may need to be rescheduled.    __x__ 1. Do not eat food or drink liquids after midnight. No gum chewing or hard candies.     ____ 2. No Alcohol for 24 hours before or after surgery.   ____ 3. Bring all medications with you on the day of surgery if instructed.    __x__ 4. Notify your doctor if there is any change in your medical condition     (cold, fever, infections).     Do not wear jewelry, make-up, hairpins, clips or nail polish.  Do not wear lotions, powders, or perfumes. You may wear deodorant.  Do not shave 48 hours prior to surgery. Men may shave face and neck.  Do not bring valuables to the hospital.    [facility] is not responsible for any belongings or valuables.               Contacts, dentures or bridgework may not be worn into surgery.  Leave your suitcase in the car. After surgery it may be brought to your room.  For patients admitted to the hospital, discharge time is determined by your                treatment team.   Patients discharged the day of surgery will not be allowed to drive home.   Please read over the following fact sheets that you were given:      __x__ Take these medicines the morning of surgery with A SIP OF WATER:    1. Protonix  2.   3.   4.  5.  6.  ____ Fleet Enema (as directed)   ____ Use CHG Soap as directed  ____ Use inhalers on the day of surgery  ____ Stop metformin 2 days prior to surgery    ____ Take 1/2 of usual insulin dose the night before surgery and none on the morning of surgery.   ____ Stop Coumadin/Plavix/aspirin on   __x__  Stop Anti-inflammatories on (STOP VOLTAREN GEL )   _x___ Stop supplements until after surgery.    ____ Bring C-Pap to the hospital.

## 2015-08-22 ENCOUNTER — Other Ambulatory Visit: Payer: BLUE CROSS/BLUE SHIELD

## 2015-08-28 ENCOUNTER — Telehealth: Payer: Self-pay

## 2015-08-28 ENCOUNTER — Encounter
Admission: RE | Disposition: A | Discharge status: Home / Self Care | Payer: Self-pay | Source: Ambulatory Visit | Attending: Urology

## 2015-08-28 ENCOUNTER — Ambulatory Visit: Payer: BLUE CROSS/BLUE SHIELD | Admitting: Anesthesiology

## 2015-08-28 ENCOUNTER — Ambulatory Visit
Admission: RE | Admit: 2015-08-28 | Discharge: 2015-08-28 | Disposition: A | Discharge status: Home / Self Care | Payer: BLUE CROSS/BLUE SHIELD | Source: Ambulatory Visit | Attending: Urology | Admitting: Urology

## 2015-08-28 DIAGNOSIS — Z87892 Personal history of anaphylaxis: Secondary | ICD-10-CM | POA: Insufficient documentation

## 2015-08-28 DIAGNOSIS — G43009 Migraine without aura, not intractable, without status migrainosus: Secondary | ICD-10-CM | POA: Insufficient documentation

## 2015-08-28 DIAGNOSIS — Z91018 Allergy to other foods: Secondary | ICD-10-CM | POA: Insufficient documentation

## 2015-08-28 DIAGNOSIS — M8580 Other specified disorders of bone density and structure, unspecified site: Secondary | ICD-10-CM | POA: Insufficient documentation

## 2015-08-28 DIAGNOSIS — Z8541 Personal history of malignant neoplasm of cervix uteri: Secondary | ICD-10-CM | POA: Insufficient documentation

## 2015-08-28 DIAGNOSIS — M797 Fibromyalgia: Secondary | ICD-10-CM | POA: Insufficient documentation

## 2015-08-28 DIAGNOSIS — Z888 Allergy status to other drugs, medicaments and biological substances status: Secondary | ICD-10-CM | POA: Insufficient documentation

## 2015-08-28 DIAGNOSIS — Z9889 Other specified postprocedural states: Secondary | ICD-10-CM | POA: Insufficient documentation

## 2015-08-28 DIAGNOSIS — Z9884 Bariatric surgery status: Secondary | ICD-10-CM | POA: Insufficient documentation

## 2015-08-28 DIAGNOSIS — E559 Vitamin D deficiency, unspecified: Secondary | ICD-10-CM | POA: Insufficient documentation

## 2015-08-28 DIAGNOSIS — G4730 Sleep apnea, unspecified: Secondary | ICD-10-CM | POA: Insufficient documentation

## 2015-08-28 DIAGNOSIS — Z881 Allergy status to other antibiotic agents status: Secondary | ICD-10-CM | POA: Insufficient documentation

## 2015-08-28 DIAGNOSIS — Z9049 Acquired absence of other specified parts of digestive tract: Secondary | ICD-10-CM | POA: Insufficient documentation

## 2015-08-28 DIAGNOSIS — J309 Allergic rhinitis, unspecified: Secondary | ICD-10-CM | POA: Insufficient documentation

## 2015-08-28 DIAGNOSIS — Z880 Allergy status to penicillin: Secondary | ICD-10-CM | POA: Insufficient documentation

## 2015-08-28 DIAGNOSIS — M109 Gout, unspecified: Secondary | ICD-10-CM | POA: Insufficient documentation

## 2015-08-28 DIAGNOSIS — G4700 Insomnia, unspecified: Secondary | ICD-10-CM | POA: Insufficient documentation

## 2015-08-28 DIAGNOSIS — N3010 Interstitial cystitis (chronic) without hematuria: Secondary | ICD-10-CM | POA: Diagnosis not present

## 2015-08-28 DIAGNOSIS — Z87891 Personal history of nicotine dependence: Secondary | ICD-10-CM | POA: Insufficient documentation

## 2015-08-28 DIAGNOSIS — E785 Hyperlipidemia, unspecified: Secondary | ICD-10-CM | POA: Insufficient documentation

## 2015-08-28 DIAGNOSIS — F329 Major depressive disorder, single episode, unspecified: Secondary | ICD-10-CM | POA: Insufficient documentation

## 2015-08-28 DIAGNOSIS — Z882 Allergy status to sulfonamides status: Secondary | ICD-10-CM | POA: Insufficient documentation

## 2015-08-28 DIAGNOSIS — Z6824 Body mass index (BMI) 24.0-24.9, adult: Secondary | ICD-10-CM | POA: Insufficient documentation

## 2015-08-28 DIAGNOSIS — Z90710 Acquired absence of both cervix and uterus: Secondary | ICD-10-CM | POA: Insufficient documentation

## 2015-08-28 DIAGNOSIS — I119 Hypertensive heart disease without heart failure: Secondary | ICD-10-CM | POA: Insufficient documentation

## 2015-08-28 DIAGNOSIS — E669 Obesity, unspecified: Secondary | ICD-10-CM | POA: Insufficient documentation

## 2015-08-28 HISTORY — PX: CYSTO WITH HYDRODISTENSION: SHX5453

## 2015-08-28 SURGERY — CYSTOSCOPY, WITH BLADDER HYDRODISTENSION
Anesthesia: General | Wound class: Clean Contaminated

## 2015-08-28 MED ORDER — ROCURONIUM BROMIDE 100 MG/10ML IV SOLN
INTRAVENOUS | Status: DC | PRN
  Administered 2015-08-28: 40 mg via INTRAVENOUS
  Administered 2015-08-28: 10 mg via INTRAVENOUS

## 2015-08-28 MED ORDER — DEXAMETHASONE SODIUM PHOSPHATE 4 MG/ML IJ SOLN
INTRAMUSCULAR | Status: DC | PRN
  Administered 2015-08-28: 8 mg via INTRAVENOUS

## 2015-08-28 MED ORDER — OXYCODONE HCL 5 MG PO TABS: 5.0000 mg | ORAL_TABLET | Freq: Once | ORAL | Status: DC | PRN

## 2015-08-28 MED ORDER — CIPROFLOXACIN HCL 500 MG PO TABS
ORAL_TABLET | ORAL | Status: AC
  Administered 2015-08-28: 500 mg via ORAL
  Filled 2015-08-28: qty 1

## 2015-08-28 MED ORDER — PROPOFOL 10 MG/ML IV BOLUS: INTRAVENOUS | Status: DC | PRN

## 2015-08-28 MED ORDER — BELLADONNA ALKALOIDS-OPIUM 16.2-60 MG RE SUPP
RECTAL | Status: AC
  Administered 2015-08-28: 1 via RECTAL
  Filled 2015-08-28: qty 1

## 2015-08-28 MED ORDER — MIDAZOLAM HCL 2 MG/2ML IJ SOLN
INTRAMUSCULAR | Status: DC | PRN
  Administered 2015-08-28: 2 mg via INTRAVENOUS

## 2015-08-28 MED ORDER — SUCCINYLCHOLINE CHLORIDE 20 MG/ML IJ SOLN
INTRAMUSCULAR | Status: DC | PRN
  Administered 2015-08-28: 100 mg via INTRAVENOUS

## 2015-08-28 MED ORDER — HYDROCODONE-ACETAMINOPHEN 5-325 MG PO TABS: 1.0000 | ORAL_TABLET | Freq: Four times a day (QID) | ORAL | Status: DC | PRN

## 2015-08-28 MED ORDER — FENTANYL CITRATE (PF) 100 MCG/2ML IJ SOLN
INTRAMUSCULAR | Status: DC | PRN
  Administered 2015-08-28 (×2): 50 ug via INTRAVENOUS

## 2015-08-28 MED ORDER — BELLADONNA ALKALOIDS-OPIUM 16.2-60 MG RE SUPP
1.0000 | Freq: Every day | RECTAL | Status: DC
  Administered 2015-08-28: 1 via RECTAL

## 2015-08-28 MED ORDER — SUGAMMADEX SODIUM 200 MG/2ML IV SOLN
INTRAVENOUS | Status: DC | PRN
  Administered 2015-08-28: 280 mg via INTRAVENOUS

## 2015-08-28 MED ORDER — ONDANSETRON HCL 4 MG/2ML IJ SOLN
INTRAMUSCULAR | Status: DC | PRN
  Administered 2015-08-28: 4 mg via INTRAVENOUS

## 2015-08-28 MED ORDER — CIPROFLOXACIN HCL 500 MG PO TABS
500.0000 mg | ORAL_TABLET | Freq: Once | ORAL | Status: AC
  Administered 2015-08-28: 500 mg via ORAL

## 2015-08-28 MED ORDER — FENTANYL CITRATE (PF) 100 MCG/2ML IJ SOLN
INTRAMUSCULAR | Status: AC
  Administered 2015-08-28: 25 ug via INTRAVENOUS
  Filled 2015-08-28: qty 2

## 2015-08-28 MED ORDER — CIPROFLOXACIN HCL 500 MG PO TABS: 500.0000 mg | ORAL_TABLET | Freq: Two times a day (BID) | ORAL | Status: DC

## 2015-08-28 MED ORDER — DOCUSATE SODIUM 100 MG PO CAPS: 100.0000 mg | ORAL_CAPSULE | Freq: Two times a day (BID) | ORAL | Status: DC

## 2015-08-28 MED ORDER — PROPOFOL 10 MG/ML IV BOLUS
INTRAVENOUS | Status: DC | PRN
  Administered 2015-08-28: 150 mg via INTRAVENOUS
  Administered 2015-08-28: 50 mg via INTRAVENOUS

## 2015-08-28 MED ORDER — LACTATED RINGERS IV SOLN
INTRAVENOUS | Status: DC
  Administered 2015-08-28 (×3): via INTRAVENOUS

## 2015-08-28 MED ORDER — OXYCODONE HCL 5 MG/5ML PO SOLN: 5.0000 mg | Freq: Once | ORAL | Status: DC | PRN

## 2015-08-28 MED ORDER — LIDOCAINE HCL (CARDIAC) 20 MG/ML IV SOLN
INTRAVENOUS | Status: DC | PRN
  Administered 2015-08-28: 60 mg via INTRAVENOUS

## 2015-08-28 MED ORDER — FENTANYL CITRATE (PF) 100 MCG/2ML IJ SOLN
25.0000 ug | INTRAMUSCULAR | Status: DC | PRN
  Administered 2015-08-28 (×2): 25 ug via INTRAVENOUS

## 2015-08-28 SURGICAL SUPPLY — 12 items
CATH FOLEY 2WAY  5CC 16FR (CATHETERS) ×1
CATH FOLEY 2WAY 5CC 16FR (CATHETERS) ×1
CATH URTH 16FR FL 2W BLN LF (CATHETERS) ×1 IMPLANT
GLOVE BIO SURGEON STRL SZ 6.5 (GLOVE) ×2 IMPLANT
GLOVE BIO SURGEON STRL SZ7 (GLOVE) ×2 IMPLANT
PACK CYSTO AR (MISCELLANEOUS) ×2 IMPLANT
PAD GROUND ADULT SPLIT (MISCELLANEOUS) ×2 IMPLANT
PREP PVP WINGED SPONGE (MISCELLANEOUS) ×2 IMPLANT
SET CYSTO W/LG BORE CLAMP LF (SET/KITS/TRAYS/PACK) ×2 IMPLANT
SYR 30ML LL (SYRINGE) ×2 IMPLANT
SYRINGE 10CC LL (SYRINGE) ×2 IMPLANT
WATER STERILE IRR 3000ML UROMA (IV SOLUTION) ×2 IMPLANT

## 2015-08-28 NOTE — Discharge Instructions (Signed)
General Anesthesia, Adult, Care After Refer to this sheet in the next few weeks. These instructions provide you with information on caring for yourself after your procedure. Your health care provider may also give you more specific instructions. Your treatment has been planned according to current medical practices, but problems sometimes occur. Call your health care provider if you have any problems or questions after your procedure. WHAT TO EXPECT AFTER THE PROCEDURE After the procedure, it is typical to experience:  Sleepiness.  Nausea and vomiting. HOME CARE INSTRUCTIONS  For the first 24 hours after general anesthesia:  Have a responsible person with you.  Do not drive a car. If you are alone, do not take public transportation.  Do not drink alcohol.  Do not take medicine that has not been prescribed by your health care provider.  Do not sign important papers or make important decisions.  You may resume a normal diet and activities as directed by your health care provider.  Change bandages (dressings) as directed.  If you have questions or problems that seem related to general anesthesia, call the hospital and ask for the anesthetist or anesthesiologist on call. SEEK MEDICAL CARE IF:  You have nausea and vomiting that continue the day after anesthesia.  You develop a rash. SEEK IMMEDIATE MEDICAL CARE IF:   You have difficulty breathing.  You have chest pain.  You have any allergic problems.   This information is not intended to replace advice given to you by your health care provider. Make sure you discuss any questions you have with your health care provider.   Document Released: 12/23/2000 Document Revised: 10/07/2014 Document Reviewed: 01/15/2012 Elsevier Interactive Patient Education 2016 [place] Bladder Hydrodistention is a procedure to examine your bladder. During hydrodistention, your bladder is filled with fluid until it is more  full than normal (distended). Your caregiver will use a thin tube with a camera on the end to examine your urethra and bladder (cystoscope). LET YOUR CAREGIVER KNOW ABOUT:  Any allergies you have.  All medicines you are taking, including vitamins, herbs, eyedrops, and over-the-counter medicines and creams.  Previous problems you or members of your family have had with the use of anesthetics.  Any blood disorders you have.  Other health problems you have. RISKS AND COMPLICATIONS Generally, hydrodistention is a safe procedure. However, as with any surgical procedure, complications can occur. Possible complications associated with hydrodistention include:  Bleeding.  Infection.  Bladder puncture.  Difficulty urinating.  Temporary inability to urinate. If this happens you may need a tube to drain urine from your bladder outside of your body (urinary catheter) for a short period after your procedure. BEFORE THE PROCEDURE  Do not eat or drink anything for at least 6 hours before your procedure.  Make plans for someone to drive you home after the procedure. PROCEDURE Hydrodistention of the bladder is an outpatient procedure. You will not need to stay overnight. It may be done in a hospital or in a surgery clinic. It usually takes about 30 minutes.  Small monitors will be placed on your body. They are used to check your heart rate, blood pressure level, and oxygen level during the procedure. An intravenous (IV) tube will be inserted into one of your veins. Fluids and medicine will flow directly into your body through the IV tube. You may be given medicine to make you sleep (general anesthetic) or you may be given medicine that makes you numb from the waist down (spinal anesthesia).  Your caregiver will gently glide the cystoscope into the tube through which urine flows out of your body (urethra) all the way to your bladder. Once in your bladder, fluid will flow through the cystoscope until  your bladder is full. Your caregiver will measure the amount of fluid it takes to fill your bladder. If your caregiver notices any abnormal tissue in your bladder, a tissue sample will be removed and sent to a lab to be examined (biopsy). When your caregiver has finished the exam, the fluid will be drained from your bladder and the cystoscope will be removed. AFTER THE PROCEDURE You will stay in a recovery area until the anesthetic wears off. Your pulse and blood pressure will be frequently monitored until you are stable. Then you can go home.   This information is not intended to replace advice given to you by your health care provider. Make sure you discuss any questions you have with your health care provider.   Document Released: 06/10/2012 Document Reviewed: 06/10/2012 Elsevier Interactive Patient Education [employer]. Cystoscopy Cystoscopy is a procedure that is used to help your caregiver diagnose and sometimes treat conditions that affect your lower urinary tract. Your lower urinary tract includes your bladder and the tube through which urine passes from your bladder out of your body (urethra). Cystoscopy is performed with a thin, tube-shaped instrument (cystoscope). The cystoscope has lenses and a light at the end so that your caregiver can see inside your bladder. The cystoscope is inserted at the entrance of your urethra. Your caregiver guides it through your urethra and into your bladder. There are two main types of cystoscopy:  Flexible cystoscopy (with a flexible cystoscope).  Rigid cystoscopy (with a rigid cystoscope). Cystoscopy may be recommended for many conditions, including:  Urinary tract infections.  Blood in your urine (hematuria).  Loss of bladder control (urinary incontinence) or overactive bladder.  Unusual cells found in a urine sample.  Urinary blockage.  Painful urination. Cystoscopy may also be done to remove a sample of your tissue to be checked under  a microscope (biopsy). It may also be done to remove or destroy bladder stones. LET YOUR CAREGIVER KNOW ABOUT:  Allergies to food or medicine.  Medicines taken, including vitamins, herbs, eyedrops, over-the-counter medicines, and creams.  Use of steroids (by mouth or creams).  Previous problems with anesthetics or numbing medicines.  History of bleeding problems or blood clots.  Previous surgery.  Other health problems, including diabetes and kidney problems.  Possibility of pregnancy, if this applies. PROCEDURE The area around the opening to your urethra will be cleaned. A medicine to numb your urethra (local anesthetic) is used. If a tissue sample or stone is removed during the procedure, you may be given a medicine to make you sleep (general anesthetic). Your caregiver will gently insert the tip of the cystoscope into your urethra. The cystoscope will be slowly glided through your urethra and into your bladder. Sterile fluid will flow through the cystoscope and into your bladder. The fluid will expand and stretch your bladder. This gives your caregiver a better view of your bladder walls. The procedure lasts about 15-20 minutes. AFTER THE PROCEDURE If a local anesthetic is used, you will be allowed to go home as soon as you are ready. If a general anesthetic is used, you will be taken to a recovery area until you are stable. You may have temporary bleeding and burning on urination.   This information is not intended to replace advice given  to you by your health care provider. Make sure you discuss any questions you have with your health care provider.   Document Released: 09/13/2000 Document Revised: 10/07/2014 Document Reviewed: 03/09/2012 Elsevier Interactive Patient Education [employer].

## 2015-08-28 NOTE — Op Note (Signed)
Date of procedure: 08/28/2015  Preoperative diagnosis:  1. Interstitial cystitis   Postoperative diagnosis:  1. Interstitial cystitis   Procedure: 1. Cystoscopy 2. Hydrodistention  Surgeon: Baruch Gouty, MD  Anesthesia: General  Complications: None  Intraoperative findings: The patient's bladder was hydrodistended to 800 cc on the first distention and 900 cc the second distention. After the first distention there was petechiae hemorrhages consistent with interstitial cystitis.  EBL: None  Specimens: None  Drains: None  Disposition: Stable to the postanesthesia care unit  Indication for procedure: The patient is a 44 y.o. female with interstitial cystitis. She has had hydrodistention in the past which has responded to symptomatically. Her last hydro distension was 7 years ago..  After reviewing the management options for treatment, the patient elected to proceed with the above surgical procedure(s). We have discussed the potential benefits and risks of the procedure, side effects of the proposed treatment, the likelihood of the patient achieving the goals of the procedure, and any potential problems that might occur during the procedure or recuperation. Informed consent has been obtained.  Description of procedure: The patient was met in the preoperative area. All risks, benefits, and indications of the procedure were described in great detail. The patient consented to the procedure. Preoperative antibiotics were given. The patient was taken to the operative theater. General anesthesia was induced per the anesthesia service. The patient was then placed in the dorsal lithotomy position and prepped and draped in the usual sterile fashion. A preoperative timeout was called. A 21 French 30 cystoscope was in the patient's bladder per urethra directly. The patient's bladder was drained. Pan cystoscopy was unremarkable. Both ureteral orifices were in the expected position. The patient's  bladder was then hydrodistended to maximum capacity and held at maximal capacity for 10 minutes. The bladder was drained for 800 cc volume.  On examination after hydrodistention there are petechiae hemorrhages consistent with interstitial cystitis. The patient's bladder was then hydrodistended a second time for a volume 900 cc and held for 5 minutes. This point the patient's bladder was drained and the patient was woken from anesthesia and transferred in stable condition to postanesthesia care unit.  Plan: The patient will follow-up in one month's time to discuss her symptom relief following hydrodistention.  Baruch Gouty, M.D.

## 2015-08-28 NOTE — Telephone Encounter (Signed)
-----   Message from Nickie Retort, MD sent at 08/28/2015 10:10 AM EST ----- Patient needs to f/u in one month. thanks

## 2015-08-28 NOTE — Transfer of Care (Signed)
Immediate Anesthesia Transfer of Care Note  Patient: Tina Harvey  Procedure(s) Performed: Procedure(s): CYSTOSCOPY/HYDRODISTENSION (N/A)  Patient Location: PACU  Anesthesia Type:General  Level of Consciousness: sedated  Airway & Oxygen Therapy: Patient Spontanous Breathing and Patient connected to face mask oxygen  Post-op Assessment: Report given to RN  Post vital signs: Reviewed and stable  Last Vitals:  Filed Vitals:   08/28/15 0804  BP: 139/84  Pulse: 77  Temp: 35.6 C  Resp: 16    Complications: No apparent anesthesia complications
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
## 2015-08-28 NOTE — Anesthesia Postprocedure Evaluation (Signed)
Anesthesia Post Note  Patient: Tina Harvey  Procedure(s) Performed: Procedure(s) (LRB): CYSTOSCOPY/HYDRODISTENSION (N/A)  Patient location during evaluation: PACU Anesthesia Type: General Level of consciousness: awake and alert Pain management: pain level controlled Vital Signs Assessment: post-procedure vital signs reviewed and stable Respiratory status: spontaneous breathing, nonlabored ventilation, respiratory function stable and patient connected to nasal cannula oxygen Cardiovascular status: blood pressure returned to baseline and stable Postop Assessment: No signs of nausea or vomiting Anesthetic complications: no    Last Vitals:  Filed Vitals:   08/28/15 1105 08/28/15 1118  BP: 147/80 152/83  Pulse: 77 77  Temp: 36.8 C 36.2 C  Resp: 12 14    Last Pain:  Filed Vitals:   08/28/15 1119  PainSc: 0-No pain                 Precious Haws Piscitello
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
## 2015-08-28 NOTE — Anesthesia Procedure Notes (Signed)
Procedure Name: Intubation Date/Time: 08/28/2015 9:40 AM Performed by: Allean Found Pre-anesthesia Checklist: Patient identified, Emergency Drugs available, Suction available, Patient being monitored and Timeout performed Patient Re-evaluated:Patient Re-evaluated prior to inductionOxygen Delivery Method: Circle system utilized Preoxygenation: Pre-oxygenation with 100% oxygen Intubation Type: IV induction Ventilation: Mask ventilation without difficulty Laryngoscope Size: Mac and 3 Grade View: Grade I Tube type: Oral Number of attempts: 1 Placement Confirmation: ETT inserted through vocal cords under direct vision,  positive ETCO2 and breath sounds checked- equal and bilateral Secured at: 21 cm Tube secured with: Tape Dental Injury: Teeth and Oropharynx as per pre-operative assessment

## 2015-08-29 ENCOUNTER — Telehealth: Payer: Self-pay

## 2015-08-29 MED ORDER — CYANOCOBALAMIN 1000 MCG/ML IJ KIT: 1000.0000 ug | PACK | INTRAMUSCULAR | Status: DC

## 2015-08-29 NOTE — Telephone Encounter (Signed)
Patient called, she states that she needs her B12 increased, that it is only lasting about 2 weeks.  [facility] [phone]

## 2015-09-04 ENCOUNTER — Telehealth: Payer: Self-pay | Admitting: Urology

## 2015-09-04 NOTE — Telephone Encounter (Signed)
Pt called and states she needs another round of antibiotics (Cipro), would also like a prescription of Diflucan, if possible.  She had surgery a week ago with Dr. Pilar Jarvis.

## 2015-09-05 NOTE — Telephone Encounter (Signed)
Dr. Pilar Jarvis please advise.

## 2015-09-06 ENCOUNTER — Other Ambulatory Visit: Payer: Self-pay | Admitting: Urology

## 2015-09-06 DIAGNOSIS — N760 Acute vaginitis: Secondary | ICD-10-CM

## 2015-09-06 MED ORDER — FLUCONAZOLE 150 MG PO TABS: 150.0000 mg | ORAL_TABLET | Freq: Once | ORAL | Status: DC

## 2015-09-06 NOTE — Telephone Encounter (Signed)
Pt is coming in tomorrow morning for a urine cx.

## 2015-09-06 NOTE — Telephone Encounter (Signed)
If she is having symptoms of a UTI, she needs to come in for a urine culture.  No antibiotics unless her culture is positive.

## 2015-09-07 ENCOUNTER — Other Ambulatory Visit: Payer: BLUE CROSS/BLUE SHIELD

## 2015-09-07 ENCOUNTER — Telehealth: Payer: Self-pay | Admitting: Urology

## 2015-09-07 NOTE — Telephone Encounter (Signed)
Spoke with patient yesterday and she states that she is having burning and itching in her vaginal/urethra area and she wanted to know if a script for fluconazole could be sent to the pharm, she tends to get yeast infections after being on antibiotics.  Patient had surgery last week and is still seeing some blood when she wipes. I offered the patient an apt with Herbert Moors but she stated that she had another apt and would not be able to come in. After speaking with Zara Council patient was asked if she could drop off a urine to check and make sure she does not have infection and we would send in a one time does of fluconazole and to utilize OTC Monistat cream and AZO for the burning with urination. Patient is in agreement with this plan and will drop off a urine.

## 2015-09-27 ENCOUNTER — Ambulatory Visit: Payer: BLUE CROSS/BLUE SHIELD

## 2015-09-29 ENCOUNTER — Ambulatory Visit: Payer: BLUE CROSS/BLUE SHIELD

## 2015-09-29 ENCOUNTER — Ambulatory Visit: Payer: BLUE CROSS/BLUE SHIELD | Admitting: Urology

## 2015-09-29 ENCOUNTER — Encounter: Payer: Self-pay | Admitting: Urology

## 2015-10-10 ENCOUNTER — Telehealth: Payer: Self-pay

## 2015-10-10 NOTE — Telephone Encounter (Signed)
Spoke with pt in reference to f/u appt with Dr. Pilar Jarvis. Pt stated she forgot about the previous appt and wanted to reschedule. Pt was transferred to the front to make f/u appt.

## 2015-10-10 NOTE — Telephone Encounter (Signed)
-----   Message from Nori Riis, PA-C sent at 10/10/2015  2:06 PM EST ----- Patient is due for her follow up after her hydro distention with Dr. Pilar Jarvis.

## 2015-10-17 ENCOUNTER — Ambulatory Visit (INDEPENDENT_AMBULATORY_CARE_PROVIDER_SITE_OTHER): Payer: BLUE CROSS/BLUE SHIELD | Admitting: Family Medicine

## 2015-10-17 ENCOUNTER — Encounter: Payer: Self-pay | Admitting: Family Medicine

## 2015-10-17 VITALS — BP 152/93 | HR 68 | Temp 97.9°F | Ht 66.2 in | Wt 160.0 lb

## 2015-10-17 DIAGNOSIS — R202 Paresthesia of skin: Secondary | ICD-10-CM | POA: Insufficient documentation

## 2015-10-17 NOTE — Assessment & Plan Note (Signed)
Tina Harvey is very concerned about her symptoms, which are more pronounced and significant in the past several months than they have been. 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 We will check lab work to look for other causes. Will hold on MRI at this time, given that she has had several previously and will refer to neurology. Referral generated today. She will call with any new symptoms or concerns.

## 2015-10-17 NOTE — Progress Notes (Signed)
BP 152/93 mmHg  Pulse 68  Temp(Src) 97.9 F (36.6 C)  Ht 5' 6.2" (1.681 m)  Wt 160 lb (72.576 kg)  BMI 25.68 kg/m2  SpO2 98%   Subjective:    Patient ID: Real Cons, female    DOB: June 22, 1971, 45 y.o.   MRN: [id number]  HPI: BRYNLI PFOST is a 45 y.o. female  Chief Complaint  Patient presents with  . balance issues   Several years ago was worked up for MS due to sudden arm weakness. Had MRI at [facility] with lesions and think that it were due to hx of migraines and scar tissue. They told her that they thought her symptoms were also possibly fibro fog. She had other stuff going on and didn't really work about it, but she notes that it has gotten significantly worse over the past 3-4 months. Saw Neuro last year in [place], but she didn't feel like they. Saw [facility] years ago (10-12 years ago)  Just picked up her glasses, and her vision is still hazy. Memory has been really bad. Can't cognitively process things. When she is charting, it's taking her 10x longer than it usually does. Incredibly fatigued. GI issues- switching back and forth from constipation to diarrhea.    Gait not what it should. Has been in a lot of pain. Worse with heat. Having paresthesias and spasms in her muscles. Has been working on trying to stretch, but it's not helping her.   Relevant past medical, surgical, family and social history reviewed and updated as indicated. Interim medical history since our last visit reviewed. Allergies and medications reviewed and updated.  Review of Systems  Respiratory: Negative.   Cardiovascular: Negative.   Gastrointestinal: Negative.   Musculoskeletal: Positive for myalgias, back pain, arthralgias, gait problem and neck stiffness. Negative for joint swelling and neck pain.  Skin: Negative.   Neurological: Positive for dizziness, tremors, weakness, light-headedness, numbness and headaches. Negative for seizures, syncope, facial asymmetry and speech difficulty.   Psychiatric/Behavioral: Negative.    Per HPI unless specifically indicated above     Objective:    BP 152/93 mmHg  Pulse 68  Temp(Src) 97.9 F (36.6 C)  Ht 5' 6.2" (1.681 m)  Wt 160 lb (72.576 kg)  BMI 25.68 kg/m2  SpO2 98%  Wt Readings from Last 3 Encounters:  10/17/15 160 lb (72.576 kg)  08/28/15 156 lb (70.761 kg)  08/21/15 157 lb (71.215 kg)    Physical Exam  Constitutional: She is oriented to person, place, and time. She appears well-developed and well-nourished. No distress.  HENT:  Head: Normocephalic and atraumatic.  Right Ear: Hearing normal.  Left Ear: Hearing normal.  Nose: Nose normal.  Eyes: Conjunctivae and lids are normal. Right eye exhibits no discharge. Left eye exhibits no discharge. No scleral icterus.  Pulmonary/Chest: Effort normal. No respiratory distress.  Musculoskeletal: Normal range of motion.  Neurological: She is alert and oriented to person, place, and time. She has normal reflexes. She is not disoriented. She displays no atrophy, no tremor and normal reflexes. No cranial nerve deficit or sensory deficit. She exhibits normal muscle tone. She displays no seizure activity. Coordination and gait abnormal.  Normal rapid alternating movements, Negative Pronator drift Strength 4/5 thoughout   Skin: Skin is intact. No rash noted.  Psychiatric: She has a normal mood and affect. Her speech is normal and behavior is normal. Judgment and thought content normal. Cognition and memory are normal.    Results for orders placed or performed  during the hospital encounter of 08/21/15  CBC  Result Value Ref Range   WBC 7.0 3.6 - 11.0 K/uL   RBC 4.34 3.80 - 5.20 MIL/uL   Hemoglobin 11.4 (L) 12.0 - 16.0 g/dL   HCT 35.2 35.0 - 47.0 %   MCV 80.9 80.0 - 100.0 fL   MCH 26.2 26.0 - 34.0 pg   MCHC 32.4 32.0 - 36.0 g/dL   RDW 13.6 11.5 - 14.5 %   Platelets 241 150 - 440 K/uL  Differential  Result Value Ref Range   Neutrophils Relative % 48 %   Neutro Abs 3.5 1.4  - 6.5 K/uL   Lymphocytes Relative 40 %   Lymphs Abs 2.8 1.0 - 3.6 K/uL   Monocytes Relative 8 %   Monocytes Absolute 0.5 0.2 - 0.9 K/uL   Eosinophils Relative 3 %   Eosinophils Absolute 0.2 0 - 0.7 K/uL   Basophils Relative 1 %   Basophils Absolute 0.0 0 - 0.1 K/uL      Assessment & Plan:   Problem List Items Addressed This Visit      Other   Paresthesias - Primary    Dominique is very concerned about her symptoms, which are more pronounced and significant in the past several months than they have been. 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 We will check lab work to look for other causes. Will hold on MRI at this time, given that she has had several previously and will refer to neurology. Referral generated today. She will call with any new symptoms or concerns.       Relevant Orders   CBC with Differential/Platelet   Comprehensive metabolic panel   Thyroid Panel With TSH   Lyme Ab/Western Blot Reflex   Rocky mtn spotted fvr abs pnl(IgG+IgM)   Ehrlichia Antibody Panel   Babesia microti Antibody Panel   ANA   Ambulatory referral to Neurology       Follow up plan: Return if symptoms worsen or fail to improve.

## 2015-10-19 ENCOUNTER — Telehealth: Payer: Self-pay | Admitting: Family Medicine

## 2015-10-19 ENCOUNTER — Encounter: Payer: Self-pay | Admitting: Family Medicine

## 2015-10-19 LAB — COMPREHENSIVE METABOLIC PANEL
ALBUMIN: 4 g/dL (ref 3.5–5.5)
ALK PHOS: 80 IU/L (ref 39–117)
ALT: 10 IU/L (ref 0–32)
AST: 14 IU/L (ref 0–40)
Albumin/Globulin Ratio: 1.3 (ref 1.1–2.5)
BILIRUBIN TOTAL: 0.3 mg/dL (ref 0.0–1.2)
BUN / CREAT RATIO: 16 (ref 9–23)
BUN: 12 mg/dL (ref 6–24)
CO2: 25 mmol/L (ref 18–29)
CREATININE: 0.76 mg/dL (ref 0.57–1.00)
Calcium: 9.5 mg/dL (ref 8.7–10.2)
Chloride: 101 mmol/L (ref 96–106)
GFR calc non Af Amer: 96 mL/min/{1.73_m2} (ref >59)
GFR, EST AFRICAN AMERICAN: 111 mL/min/{1.73_m2} (ref >59)
GLOBULIN, TOTAL: 3.2 g/dL (ref 1.5–4.5)
Glucose: 83 mg/dL (ref 65–99)
Potassium: 4 mmol/L (ref 3.5–5.2)
SODIUM: 142 mmol/L (ref 134–144)
TOTAL PROTEIN: 7.2 g/dL (ref 6.0–8.5)

## 2015-10-19 LAB — EHRLICHIA ANTIBODY PANEL
E. CHAFFEENSIS IGG AB: NEGATIVE
E. Chaffeensis (HME) IgM Titer: NEGATIVE
HGE IGM TITER: NEGATIVE
HGE IgG Titer: NEGATIVE

## 2015-10-19 LAB — CBC WITH DIFFERENTIAL/PLATELET
BASOS ABS: 0 10*3/uL (ref 0.0–0.2)
Basos: 0 %
EOS (ABSOLUTE): 0.1 10*3/uL (ref 0.0–0.4)
Eos: 2 %
Hematocrit: 35.3 % (ref 34.0–46.6)
Hemoglobin: 11.6 g/dL (ref 11.1–15.9)
Immature Grans (Abs): 0 10*3/uL (ref 0.0–0.1)
Immature Granulocytes: 0 %
LYMPHS ABS: 2.3 10*3/uL (ref 0.7–3.1)
Lymphs: 43 %
MCH: 25.6 pg — AB (ref 26.6–33.0)
MCHC: 32.9 g/dL (ref 31.5–35.7)
MCV: 78 fL — ABNORMAL LOW (ref 79–97)
MONOS ABS: 0.4 10*3/uL (ref 0.1–0.9)
Monocytes: 8 %
Neutrophils Absolute: 2.6 10*3/uL (ref 1.4–7.0)
Neutrophils: 47 %
Platelets: 289 10*3/uL (ref 150–379)
RBC: 4.54 x10E6/uL (ref 3.77–5.28)
RDW: 15.4 % (ref 12.3–15.4)
WBC: 5.5 10*3/uL (ref 3.4–10.8)

## 2015-10-19 LAB — THYROID PANEL WITH TSH
Free Thyroxine Index: 2.4 (ref 1.2–4.9)
T3 Uptake Ratio: 27 % (ref 24–39)
T4, Total: 9 ug/dL (ref 4.5–12.0)
TSH: 1.74 u[IU]/mL (ref 0.450–4.500)

## 2015-10-19 LAB — BABESIA MICROTI ANTIBODY PANEL
Babesia microti IgG: <1:10 {titer}
Babesia microti IgM: <1:10 {titer}

## 2015-10-19 LAB — ANA: ANA: NEGATIVE

## 2015-10-19 LAB — LYME AB/WESTERN BLOT REFLEX: LYME DISEASE AB, QUANT, IGM: <0.8 index (ref 0.00–0.79)

## 2015-10-19 LAB — ROCKY MTN SPOTTED FVR ABS PNL(IGG+IGM)
RMSF IGG: NEGATIVE
RMSF IGM: 0.15 {index} (ref 0.00–0.89)

## 2015-10-19 NOTE — Telephone Encounter (Signed)
Patient notified

## 2015-10-19 NOTE — Telephone Encounter (Signed)
Can you let her know that her labs came back normal? Thanks!

## 2015-10-25 DIAGNOSIS — H532 Diplopia: Secondary | ICD-10-CM | POA: Insufficient documentation

## 2015-10-25 DIAGNOSIS — F4489 Other dissociative and conversion disorders: Secondary | ICD-10-CM | POA: Insufficient documentation

## 2015-10-25 DIAGNOSIS — H538 Other visual disturbances: Secondary | ICD-10-CM | POA: Insufficient documentation

## 2015-10-25 DIAGNOSIS — IMO0001 Reserved for inherently not codable concepts without codable children: Secondary | ICD-10-CM | POA: Insufficient documentation

## 2015-10-25 DIAGNOSIS — R413 Other amnesia: Secondary | ICD-10-CM | POA: Insufficient documentation

## 2015-10-27 ENCOUNTER — Telehealth: Payer: Self-pay

## 2015-10-27 NOTE — Telephone Encounter (Signed)
Patient states that she is very concerned in how to move forward.  Patient states that she saw Dr. Melrose Nakayama on Wednesday and that she had an awful expreience.  Patient would like to speak with you the outcome of her visit.  Please call patient at [phone].

## 2015-10-30 MED ORDER — EPINEPHRINE 0.3 MG/0.3ML IJ SOAJ: 0.3000 mg | Freq: Once | INTRAMUSCULAR | Status: DC
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
## 2015-11-02 ENCOUNTER — Telehealth: Payer: Self-pay | Admitting: Gastroenterology

## 2015-11-02 NOTE — Telephone Encounter (Signed)
Called patient to schedule appointment. She thought Dr. Allen Norris worked at [facility]. She is going to talk to her doctor and get back to me. She also is looking for an appointment in Feb. Due to no insurance after this month.

## 2015-11-03 ENCOUNTER — Encounter: Payer: Self-pay | Admitting: Obstetrics and Gynecology

## 2015-11-03 ENCOUNTER — Ambulatory Visit: Payer: BLUE CROSS/BLUE SHIELD | Admitting: Urology

## 2015-11-03 ENCOUNTER — Ambulatory Visit: Payer: BLUE CROSS/BLUE SHIELD | Admitting: Obstetrics and Gynecology

## 2015-11-06 ENCOUNTER — Encounter: Payer: Self-pay | Admitting: Gastroenterology

## 2015-11-06 ENCOUNTER — Other Ambulatory Visit: Payer: Self-pay

## 2015-11-06 ENCOUNTER — Ambulatory Visit (INDEPENDENT_AMBULATORY_CARE_PROVIDER_SITE_OTHER): Payer: BLUE CROSS/BLUE SHIELD | Admitting: Gastroenterology

## 2015-11-06 VITALS — BP 129/82 | HR 82 | Temp 98.3°F | Ht 67.0 in | Wt 161.0 lb

## 2015-11-06 DIAGNOSIS — R112 Nausea with vomiting, unspecified: Secondary | ICD-10-CM

## 2015-11-06 DIAGNOSIS — R1013 Epigastric pain: Secondary | ICD-10-CM

## 2015-11-06 DIAGNOSIS — G8929 Other chronic pain: Secondary | ICD-10-CM | POA: Diagnosis not present
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
## 2015-11-08 ENCOUNTER — Encounter: Payer: Self-pay | Admitting: *Deleted

## 2015-11-10 NOTE — Discharge Instructions (Signed)

## 2015-11-13 ENCOUNTER — Ambulatory Visit: Payer: BLUE CROSS/BLUE SHIELD | Admitting: Student in an Organized Health Care Education/Training Program

## 2015-11-13 ENCOUNTER — Ambulatory Visit
Admission: RE | Admit: 2015-11-13 | Discharge: 2015-11-13 | Disposition: A | Discharge status: Home / Self Care | Payer: BLUE CROSS/BLUE SHIELD | Source: Ambulatory Visit | Attending: Gastroenterology | Admitting: Gastroenterology

## 2015-11-13 ENCOUNTER — Encounter: Payer: Self-pay | Admitting: *Deleted

## 2015-11-13 ENCOUNTER — Encounter
Admission: RE | Disposition: A | Discharge status: Home / Self Care | Payer: Self-pay | Source: Ambulatory Visit | Attending: Gastroenterology

## 2015-11-13 DIAGNOSIS — M19042 Primary osteoarthritis, left hand: Secondary | ICD-10-CM | POA: Diagnosis not present

## 2015-11-13 DIAGNOSIS — Z8249 Family history of ischemic heart disease and other diseases of the circulatory system: Secondary | ICD-10-CM | POA: Insufficient documentation

## 2015-11-13 DIAGNOSIS — Z87892 Personal history of anaphylaxis: Secondary | ICD-10-CM | POA: Insufficient documentation

## 2015-11-13 DIAGNOSIS — M8588 Other specified disorders of bone density and structure, other site: Secondary | ICD-10-CM | POA: Insufficient documentation

## 2015-11-13 DIAGNOSIS — G43009 Migraine without aura, not intractable, without status migrainosus: Secondary | ICD-10-CM | POA: Insufficient documentation

## 2015-11-13 DIAGNOSIS — E669 Obesity, unspecified: Secondary | ICD-10-CM | POA: Insufficient documentation

## 2015-11-13 DIAGNOSIS — Z882 Allergy status to sulfonamides status: Secondary | ICD-10-CM | POA: Insufficient documentation

## 2015-11-13 DIAGNOSIS — M19041 Primary osteoarthritis, right hand: Secondary | ICD-10-CM | POA: Diagnosis not present

## 2015-11-13 DIAGNOSIS — E785 Hyperlipidemia, unspecified: Secondary | ICD-10-CM | POA: Diagnosis not present

## 2015-11-13 DIAGNOSIS — G4700 Insomnia, unspecified: Secondary | ICD-10-CM | POA: Diagnosis not present

## 2015-11-13 DIAGNOSIS — Z8541 Personal history of malignant neoplasm of cervix uteri: Secondary | ICD-10-CM | POA: Insufficient documentation

## 2015-11-13 DIAGNOSIS — Z823 Family history of stroke: Secondary | ICD-10-CM | POA: Diagnosis not present

## 2015-11-13 DIAGNOSIS — M109 Gout, unspecified: Secondary | ICD-10-CM | POA: Insufficient documentation

## 2015-11-13 DIAGNOSIS — Z6824 Body mass index (BMI) 24.0-24.9, adult: Secondary | ICD-10-CM | POA: Diagnosis not present

## 2015-11-13 DIAGNOSIS — Z8709 Personal history of other diseases of the respiratory system: Secondary | ICD-10-CM | POA: Diagnosis not present

## 2015-11-13 DIAGNOSIS — Z833 Family history of diabetes mellitus: Secondary | ICD-10-CM | POA: Insufficient documentation

## 2015-11-13 DIAGNOSIS — M797 Fibromyalgia: Secondary | ICD-10-CM | POA: Diagnosis not present

## 2015-11-13 DIAGNOSIS — Z881 Allergy status to other antibiotic agents status: Secondary | ICD-10-CM | POA: Diagnosis not present

## 2015-11-13 DIAGNOSIS — Z87891 Personal history of nicotine dependence: Secondary | ICD-10-CM | POA: Diagnosis not present

## 2015-11-13 DIAGNOSIS — Z803 Family history of malignant neoplasm of breast: Secondary | ICD-10-CM | POA: Insufficient documentation

## 2015-11-13 DIAGNOSIS — Z9884 Bariatric surgery status: Secondary | ICD-10-CM | POA: Insufficient documentation

## 2015-11-13 DIAGNOSIS — F329 Major depressive disorder, single episode, unspecified: Secondary | ICD-10-CM | POA: Diagnosis not present

## 2015-11-13 DIAGNOSIS — Z820 Family history of epilepsy and other diseases of the nervous system: Secondary | ICD-10-CM | POA: Insufficient documentation

## 2015-11-13 DIAGNOSIS — Z90710 Acquired absence of both cervix and uterus: Secondary | ICD-10-CM | POA: Diagnosis not present

## 2015-11-13 DIAGNOSIS — Z91030 Bee allergy status: Secondary | ICD-10-CM | POA: Insufficient documentation

## 2015-11-13 DIAGNOSIS — R1013 Epigastric pain: Secondary | ICD-10-CM | POA: Diagnosis present

## 2015-11-13 DIAGNOSIS — Z880 Allergy status to penicillin: Secondary | ICD-10-CM | POA: Diagnosis not present

## 2015-11-13 DIAGNOSIS — Z91018 Allergy to other foods: Secondary | ICD-10-CM | POA: Insufficient documentation

## 2015-11-13 DIAGNOSIS — Z9049 Acquired absence of other specified parts of digestive tract: Secondary | ICD-10-CM | POA: Insufficient documentation

## 2015-11-13 DIAGNOSIS — Z836 Family history of other diseases of the respiratory system: Secondary | ICD-10-CM | POA: Diagnosis not present

## 2015-11-13 DIAGNOSIS — K219 Gastro-esophageal reflux disease without esophagitis: Secondary | ICD-10-CM | POA: Diagnosis not present

## 2015-11-13 DIAGNOSIS — Z818 Family history of other mental and behavioral disorders: Secondary | ICD-10-CM | POA: Insufficient documentation

## 2015-11-13 DIAGNOSIS — Z8719 Personal history of other diseases of the digestive system: Secondary | ICD-10-CM | POA: Insufficient documentation

## 2015-11-13 DIAGNOSIS — Z832 Family history of diseases of the blood and blood-forming organs and certain disorders involving the immune mechanism: Secondary | ICD-10-CM | POA: Diagnosis not present

## 2015-11-13 HISTORY — DX: Unspecified osteoarthritis, unspecified site: M19.90

## 2015-11-13 HISTORY — DX: Presence of dental prosthetic device (complete) (partial): Z97.2

## 2015-11-13 HISTORY — DX: Dizziness and giddiness: R42

## 2015-11-13 HISTORY — DX: Other complications of anesthesia, initial encounter: T88.59XA

## 2015-11-13 HISTORY — DX: Gastric ulcer, unspecified as acute or chronic, without hemorrhage or perforation: K25.9

## 2015-11-13 HISTORY — DX: Adverse effect of unspecified anesthetic, initial encounter: T41.45XA

## 2015-11-13 HISTORY — DX: Gastro-esophageal reflux disease without esophagitis: K21.9

## 2015-11-13 HISTORY — PX: ESOPHAGOGASTRODUODENOSCOPY (EGD) WITH PROPOFOL: SHX5813

## 2015-11-13 SURGERY — ESOPHAGOGASTRODUODENOSCOPY (EGD) WITH PROPOFOL
Anesthesia: Monitor Anesthesia Care | Wound class: Clean Contaminated

## 2015-11-13 MED ORDER — PROPOFOL 10 MG/ML IV BOLUS
INTRAVENOUS | Status: DC | PRN
  Administered 2015-11-13 (×2): 30 mg via INTRAVENOUS
  Administered 2015-11-13: 70 mg via INTRAVENOUS

## 2015-11-13 MED ORDER — STERILE WATER FOR IRRIGATION IR SOLN
Status: DC | PRN
  Administered 2015-11-13: 10:00:00

## 2015-11-13 MED ORDER — LIDOCAINE HCL (CARDIAC) 20 MG/ML IV SOLN
INTRAVENOUS | Status: DC | PRN
  Administered 2015-11-13: 50 mg via INTRAVENOUS

## 2015-11-13 MED ORDER — LACTATED RINGERS IV SOLN
INTRAVENOUS | Status: DC
  Administered 2015-11-13: 10:00:00 via INTRAVENOUS

## 2015-11-13 MED ORDER — LACTATED RINGERS IV SOLN
INTRAVENOUS | Status: DC
  Administered 2015-11-13: 09:00:00 via INTRAVENOUS

## 2015-11-13 MED ORDER — GLYCOPYRROLATE 0.2 MG/ML IJ SOLN
INTRAMUSCULAR | Status: DC | PRN
  Administered 2015-11-13: 0.1 mg via INTRAVENOUS

## 2015-11-13 MED ORDER — ACETAMINOPHEN 325 MG PO TABS: 325.0000 mg | ORAL_TABLET | ORAL | Status: DC | PRN

## 2015-11-13 MED ORDER — ACETAMINOPHEN 160 MG/5ML PO SOLN: 325.0000 mg | ORAL | Status: DC | PRN

## 2015-11-13 SURGICAL SUPPLY — 39 items
BALLN DILATOR 10-12 8 (BALLOONS)
BALLN DILATOR 12-15 8 (BALLOONS)
BALLN DILATOR 15-18 8 (BALLOONS)
BALLN DILATOR CRE 0-12 8 (BALLOONS)
BALLN DILATOR ESOPH 8 10 CRE (MISCELLANEOUS) IMPLANT
BALLOON DILATOR 12-15 8 (BALLOONS) IMPLANT
BALLOON DILATOR 15-18 8 (BALLOONS) IMPLANT
BALLOON DILATOR CRE 0-12 8 (BALLOONS) IMPLANT
BLOCK BITE 60FR ADLT L/F GRN (MISCELLANEOUS) ×2 IMPLANT
CANISTER SUCT 1200ML W/VALVE (MISCELLANEOUS) ×2 IMPLANT
FCP ESCP3.2XJMB 240X2.8X (MISCELLANEOUS)
FORCEPS BIOP RAD 4 LRG CAP 4 (CUTTING FORCEPS) IMPLANT
FORCEPS BIOP RJ4 240 W/NDL (MISCELLANEOUS)
FORCEPS ESCP3.2XJMB 240X2.8X (MISCELLANEOUS) IMPLANT
GOWN CVR UNV OPN BCK APRN NK (MISCELLANEOUS) ×2 IMPLANT
GOWN ISOL THUMB LOOP REG UNIV (MISCELLANEOUS) ×4
HEMOCLIP INSTINCT (CLIP) IMPLANT
INJECTOR VARIJECT VIN23 (MISCELLANEOUS) IMPLANT
KIT CO2 TUBING (TUBING) IMPLANT
KIT DEFENDO VALVE AND CONN (KITS) IMPLANT
KIT ENDO PROCEDURE OLY (KITS) ×2 IMPLANT
LIGATOR MULTIBAND 6SHOOTER MBL (MISCELLANEOUS) IMPLANT
MARKER SPOT ENDO TATTOO 5ML (MISCELLANEOUS) IMPLANT
PAD GROUND ADULT SPLIT (MISCELLANEOUS) IMPLANT
SNARE SHORT THROW 13M SML OVAL (MISCELLANEOUS) IMPLANT
SNARE SHORT THROW 30M LRG OVAL (MISCELLANEOUS) IMPLANT
SPOT EX ENDOSCOPIC TATTOO (MISCELLANEOUS)
SUCTION POLY TRAP 4CHAMBER (MISCELLANEOUS) IMPLANT
SYR INFLATION 60ML (SYRINGE) IMPLANT
TRAP SUCTION POLY (MISCELLANEOUS) IMPLANT
TUBING CONN 6MMX3.1M (TUBING)
TUBING SUCTION CONN 0.25 STRL (TUBING) IMPLANT
UNDERPAD 30X60 958B10 (PK) (MISCELLANEOUS) IMPLANT
VALVE BIOPSY ENDO (VALVE) IMPLANT
VARIJECT INJECTOR VIN23 (MISCELLANEOUS)
WATER AUXILLARY (MISCELLANEOUS) IMPLANT
WATER STERILE IRR 250ML POUR (IV SOLUTION) ×2 IMPLANT
WATER STERILE IRR 500ML POUR (IV SOLUTION) IMPLANT
WIRE CRE 18-20MM 8CM F G (MISCELLANEOUS) IMPLANT

## 2015-11-13 NOTE — Op Note (Signed)
[facility] Gastroenterology Patient Name: Tina Harvey Procedure Date: 11/13/2015 10:04 AM MRN: [id number] Account #: [id number] Date of Birth: [birth year] Admit Type: Outpatient Age: 45 Room: [facility] OR ROOM 01 Gender: Female Note Status: Finalized Procedure:         Upper GI endoscopy Indications:       Epigastric abdominal pain Providers:         Lucilla Lame, MD Referring MD:      Valerie Roys (Referring MD) Medicines:         Propofol per Anesthesia Complications:     No immediate complications. Procedure:         Pre-Anesthesia Assessment:                    - Prior to the procedure, a History and Physical was                     performed, and patient medications and allergies were                     reviewed. The patient's tolerance of previous anesthesia                     was also reviewed. The risks and benefits of the procedure                     and the sedation options and risks were discussed with the                     patient. All questions were answered, and informed consent                     was obtained. Prior Anticoagulants: The patient has taken                     no previous anticoagulant or antiplatelet agents. ASA                     Grade Assessment: II - A patient with mild systemic                     disease. After reviewing the risks and benefits, the                     patient was deemed in satisfactory condition to undergo                     the procedure.                    After obtaining informed consent, the endoscope was passed                     under direct vision. Throughout the procedure, the                     patient's blood pressure, pulse, and oxygen saturations                     were monitored continuously. The was introduced through                     the mouth, and advanced to the jejunum. The upper GI  endoscopy was accomplished without difficulty. The patient   tolerated the procedure well. Findings:      The examined esophagus was normal.      Evidence of a gastric bypass was found. A gastric pouch with a normal       size was found. The staple line appeared intact. The gastrojejunal       anastomosis was characterized by healthy appearing mucosa. This was       traversed. The pouch-to-jejunum limb was characterized by healthy       appearing mucosa.      The examined jejunum was normal. Impression:        - Normal esophagus.                    - Gastric bypass with a normal-sized pouch and intact                     staple line. Gastrojejunal anastomosis characterized by                     healthy appearing mucosa.                    - Normal examined jejunum.                    - No specimens collected. Recommendation:    - Continue present medications. Procedure Code(s): --- Professional ---                    [phone], Esophagogastroduodenoscopy, flexible, transoral;                     diagnostic, including collection of specimen(s) by                     brushing or washing, when performed (separate procedure) Diagnosis Code(s): --- Professional ---                    R10.13, Epigastric pain                    Z98.84, Bariatric surgery status CPT copyright 2016 American Medical Association. All rights reserved. The codes documented in this report are preliminary and upon coder review may  be revised to meet current compliance requirements. Lucilla Lame, MD 11/13/2015 10:17:52 AM This report has been signed electronically. Number of Addenda: 0 Note Initiated On: 11/13/2015 10:04 AM Total Procedure Duration: 0 hours 2 minutes 41 seconds       [facility]

## 2015-11-13 NOTE — Transfer of Care (Signed)
Immediate Anesthesia Transfer of Care Note  Patient: Tina Harvey  Procedure(s) Performed: Procedure(s): ESOPHAGOGASTRODUODENOSCOPY (EGD) WITH PROPOFOL (N/A)  Patient Location: PACU  Anesthesia Type: MAC  Level of Consciousness: awake, alert  and patient cooperative  Airway and Oxygen Therapy: Patient Spontanous Breathing and Patient connected to supplemental oxygen  Post-op Assessment: Post-op Vital signs reviewed, Patient's Cardiovascular Status Stable, Respiratory Function Stable, Patent Airway and No signs of Nausea or vomiting  Post-op Vital Signs: Reviewed and stable  Complications: No apparent anesthesia complications
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
## 2015-11-13 NOTE — Anesthesia Procedure Notes (Signed)
Procedure Name: MAC Performed by: Raizel Wesolowski Pre-anesthesia Checklist: Patient identified, Emergency Drugs available, Suction available, Timeout performed and Patient being monitored Patient Re-evaluated:Patient Re-evaluated prior to inductionOxygen Delivery Method: Nasal cannula Placement Confirmation: positive ETCO2       

## 2015-11-13 NOTE — Anesthesia Postprocedure Evaluation (Signed)
Anesthesia Post Note  Patient: Tina Harvey  Procedure(s) Performed: Procedure(s) (LRB): ESOPHAGOGASTRODUODENOSCOPY (EGD) WITH PROPOFOL (N/A)  Patient location during evaluation: PACU Anesthesia Type: MAC Level of consciousness: awake and alert Pain management: pain level controlled Vital Signs Assessment: post-procedure vital signs reviewed and stable Respiratory status: spontaneous breathing, nonlabored ventilation and respiratory function stable Cardiovascular status: blood pressure returned to baseline and stable Postop Assessment: no signs of nausea or vomiting Anesthetic complications: no    Trecia Rogers

## 2015-11-13 NOTE — Anesthesia Preprocedure Evaluation (Addendum)
Anesthesia Evaluation  Patient identified by MRN, date of birth, ID band Patient awake    Reviewed: Allergy & Precautions, H&P , NPO status , Patient's Chart, lab work & pertinent test results, reviewed documented beta blocker date and time   History of Anesthesia Complications (+) PROLONGED EMERGENCE and history of anesthetic complications  Airway Mallampati: I  TM Distance: >3 FB Neck ROM: full    Dental  (+) Upper Dentures   Pulmonary asthma , sleep apnea , former smoker,  No OSA or HTN  following bariatric surgery   Pulmonary exam normal breath sounds clear to auscultation       Cardiovascular Exercise Tolerance: Good hypertension, negative cardio ROS Normal cardiovascular exam Rhythm:regular Rate:Normal     Neuro/Psych  Headaches, PSYCHIATRIC DISORDERS  Neuromuscular disease negative psych ROS   GI/Hepatic Neg liver ROS, PUD, GERD  ,  Endo/Other  negative endocrine ROS  Renal/GU negative Renal ROS  negative genitourinary   Musculoskeletal   Abdominal   Peds  Hematology negative hematology ROS (+)   Anesthesia Other Findings   Reproductive/Obstetrics negative OB ROS                            Anesthesia Physical Anesthesia Plan  ASA: II  Anesthesia Plan: MAC   Post-op Pain Management:    Induction:   Airway Management Planned: Nasal Cannula  Additional Equipment:   Intra-op Plan:   Post-operative Plan:   Informed Consent: I have reviewed the patients History and Physical, chart, labs and discussed the procedure including the risks, benefits and alternatives for the proposed anesthesia with the patient or authorized representative who has indicated his/her understanding and acceptance.   Dental Advisory Given  Plan Discussed with: CRNA  Anesthesia Plan Comments:         Anesthesia Quick Evaluation

## 2015-11-14 ENCOUNTER — Encounter: Payer: Self-pay | Admitting: Gastroenterology

## 2015-12-07 ENCOUNTER — Ambulatory Visit: Payer: BLUE CROSS/BLUE SHIELD | Admitting: Gastroenterology

## 2015-12-22 ENCOUNTER — Encounter: Payer: Self-pay | Admitting: Family Medicine

## 2015-12-22 ENCOUNTER — Ambulatory Visit (INDEPENDENT_AMBULATORY_CARE_PROVIDER_SITE_OTHER): Payer: BLUE CROSS/BLUE SHIELD | Admitting: Family Medicine

## 2015-12-22 VITALS — BP 130/84 | HR 87 | Temp 98.1°F | Ht 66.25 in | Wt 161.8 lb

## 2015-12-22 DIAGNOSIS — R5382 Chronic fatigue, unspecified: Secondary | ICD-10-CM

## 2015-12-22 DIAGNOSIS — R300 Dysuria: Secondary | ICD-10-CM | POA: Diagnosis not present

## 2015-12-22 DIAGNOSIS — R202 Paresthesia of skin: Secondary | ICD-10-CM | POA: Diagnosis not present

## 2015-12-22 NOTE — Progress Notes (Signed)
BP 130/84 mmHg  Pulse 87  Temp(Src) 98.1 F (36.7 C)  Ht 5' 6.25" (1.683 m)  Wt 161 lb 12.8 oz (73.392 kg)  BMI 25.91 kg/m2  SpO2 98%   Subjective:    Patient ID: Real Cons, female    DOB: 02/13/1971, 45 y.o.   MRN: [id number]  HPI: Tina Harvey is a 45 y.o. female  Chief Complaint  Patient presents with  . Follow-up  . Numbness    Neurology appointment April 7th at [facility]. No longer seeing Dr. Melrose Nakayama.   . Balance Loss  . Memory Loss   Was not happy with her appointment, felt very dismissed. Not going to back to see Dr. Melrose Nakayama. Is going to see [facility] neurology, where she has been previously in 2 weeks. Going to bring the reports on her old MRIs to that appointment. She does not feel any better. She feels like everything has gotten worse. She notes that everything is blurry. Has had a fall going up her steps into her building. Has been falling. Has not been able to work due to [employer] issues, fog and "spastic muscles". She wants to know what is going on. Has been working with a therapist, and feels like she is in a better place mentally than she has been in years. She notes that this has been going on off and on for years, but that it's worse after her surgery and weight loss. She is not sure if it has something to do with that. Has had blood work done previously which was normal. Is concerned about adrenal fatigue. Would like to have her cortisol levels checked- will have her return to have blood work done in the AM. Will recheck her vitamin D at that time as well as it has been low. She is very frustrated and wants to know what is causing this even if it is psycho-somatic.   Does note that she has been having some dysuria and frequency. Has history of IC. No problems with blood in her urine. + urgency. No fevers, chills, vomiting. Would like to make sure she doesn't have a UTI.   Relevant past medical, surgical, family and social history reviewed and updated as indicated. Interim medical  history since our last visit reviewed. Allergies and medications reviewed and updated.  Review of Systems  Constitutional: Positive for fatigue. Negative for fever, chills, diaphoresis, activity change, appetite change and unexpected weight change.  HENT: Negative for congestion, drooling, ear discharge, ear pain, facial swelling, hearing loss, mouth sores, nosebleeds, postnasal drip, rhinorrhea, sinus pressure, sneezing, sore throat, tinnitus, trouble swallowing and voice change.   Respiratory: Negative.   Genitourinary: Positive for dysuria, urgency, frequency, pelvic pain and dyspareunia. Negative for hematuria, flank pain, decreased urine volume, vaginal bleeding, vaginal discharge, enuresis, difficulty urinating, genital sores, vaginal pain and menstrual problem.  Musculoskeletal: Positive for myalgias. Negative for back pain, joint swelling, arthralgias, gait problem, neck pain and neck stiffness.  Skin: Negative.   Neurological: Positive for dizziness, weakness, light-headedness, numbness and headaches. Negative for tremors, seizures, syncope, facial asymmetry and speech difficulty.  Psychiatric/Behavioral: Positive for confusion. Negative for suicidal ideas, hallucinations, behavioral problems, sleep disturbance, self-injury, dysphoric mood, decreased concentration and agitation. The patient is not nervous/anxious and is not hyperactive.     Per HPI unless specifically indicated above     Objective:    BP 130/84 mmHg  Pulse 87  Temp(Src) 98.1 F (36.7 C)  Ht 5' 6.25" (1.683 m)  Wt 161 lb 12.8  oz (73.392 kg)  BMI 25.91 kg/m2  SpO2 98%  Wt Readings from Last 3 Encounters:  12/22/15 161 lb 12.8 oz (73.392 kg)  11/13/15 161 lb (73.029 kg)  11/06/15 161 lb (73.029 kg)    Physical Exam  Constitutional: She is oriented to person, place, and time. She appears well-developed and well-nourished. No distress.  HENT:  Head: Normocephalic and atraumatic.  Right Ear: Hearing normal.   Left Ear: Hearing normal.  Nose: Nose normal.  Eyes: Conjunctivae and lids are normal. Right eye exhibits no discharge. Left eye exhibits no discharge. No scleral icterus.  Cardiovascular: Normal rate, regular rhythm, normal heart sounds and intact distal pulses.  Exam reveals no gallop and no friction rub.   No murmur heard. Pulmonary/Chest: Effort normal and breath sounds normal. No respiratory distress. She has no wheezes. She has no rales. She exhibits no tenderness.  Musculoskeletal: Normal range of motion.  Neurological: She is alert and oriented to person, place, and time.  Skin: Skin is warm, dry and intact. No rash noted. She is not diaphoretic. No erythema. No pallor.  Psychiatric: She has a normal mood and affect. Her speech is normal and behavior is normal. Judgment and thought content normal. Cognition and memory are normal.  Nursing note and vitals reviewed.   Results for orders placed or performed in visit on 12/22/15  Microscopic Examination  Result Value Ref Range   WBC, UA 0-5 0 -  5 /hpf   RBC, UA None seen 0 -  2 /hpf   Epithelial Cells (non renal) 0-10 0 - 10 /hpf   Renal Epithel, UA None seen None seen /hpf   Crystals Present N/A   Crystal Type Calcium Oxalate N/A   Mucus, UA Present Not Estab.   Bacteria, UA Few None seen/Few  UA/M w/rflx Culture, Routine  Result Value Ref Range   Specific Gravity, UA 1.025 1.005 - 1.030   pH, UA 6.0 5.0 - 7.5   Color, UA Yellow Yellow   Appearance Ur Clear Clear   Leukocytes, UA Trace (A) Negative   Protein, UA Negative Negative/Trace   Glucose, UA Negative Negative   Ketones, UA Negative Negative   RBC, UA Negative Negative   Bilirubin, UA Negative Negative   Urobilinogen, Ur 1.0 0.2 - 1.0 mg/dL   Nitrite, UA Negative Negative   Microscopic Examination See below:    Urinalysis Reflex Comment   Urine Culture, Routine  Result Value Ref Range   Urine Culture, Routine WILL FOLLOW       Assessment & Plan:   Problem  List Items Addressed This Visit      Other   Paresthesias - Primary    Tina Harvey is very concerned about her symptoms, which are more pronounced and significant in the past several months than they have been. 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 Labs normal last visit. Not happy with neurology appointment, will see [facility] for 2nd opinion. Seeing them 01/05/16.  Will hold on MRI at this time, given that she has had several previously and will refer to neurology. She will call with any new symptoms or concerns.       Relevant Orders   VITAMIN D 25 Hydroxy (Vit-D Deficiency, Fractures)   Comprehensive metabolic panel   CBC With Differential/Platelet   Cortisol   ACTH    Other Visit Diagnoses    Dysuria        + Leuks. Will wait  on culture and treat if needed. She is aware.     Relevant Orders    UA/M w/rflx Culture, Routine (Completed)    Chronic fatigue        Checking labs next week 1st thing in the AM. Await results. Continue to montior.     Relevant Orders    VITAMIN D 25 Hydroxy (Vit-D Deficiency, Fractures)    Comprehensive metabolic panel    CBC With Differential/Platelet    Cortisol    ACTH        Follow up plan: Return 4-5 weeks.

## 2015-12-22 NOTE — Assessment & Plan Note (Signed)
Tina Harvey is very concerned about her symptoms, which are more pronounced and significant in the past several months than they have been. 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 Labs normal last visit. Not happy with neurology appointment, will see [facility] for 2nd opinion. Seeing them 01/05/16.  Will hold on MRI at this time, given that she has had several previously and will refer to neurology. She will call with any new symptoms or concerns.

## 2015-12-23 LAB — URINE CULTURE, REFLEX: Organism ID, Bacteria: NO GROWTH

## 2015-12-23 LAB — UA/M W/RFLX CULTURE, ROUTINE
BILIRUBIN UA: NEGATIVE
Glucose, UA: NEGATIVE
KETONES UA: NEGATIVE
Nitrite, UA: NEGATIVE
Protein, UA: NEGATIVE
RBC, UA: NEGATIVE
SPEC GRAV UA: 1.025 (ref 1.005–1.030)
Urobilinogen, Ur: 1 mg/dL (ref 0.2–1.0)
pH, UA: 6 (ref 5.0–7.5)

## 2015-12-23 LAB — MICROSCOPIC EXAMINATION
RBC MICROSCOPIC, UA: NONE SEEN /HPF (ref 0–?)
Renal Epithel, UA: NONE SEEN /hpf

## 2015-12-26 ENCOUNTER — Telehealth: Payer: Self-pay | Admitting: Family Medicine

## 2015-12-26 NOTE — Telephone Encounter (Signed)
Please let her know that her urine didn't grow out anything, so no need for antibiotics. Thanks!

## 2015-12-26 NOTE — Telephone Encounter (Signed)
Patient notified

## 2015-12-28 ENCOUNTER — Other Ambulatory Visit: Payer: Self-pay | Admitting: Family Medicine

## 2015-12-28 ENCOUNTER — Other Ambulatory Visit: Payer: BLUE CROSS/BLUE SHIELD

## 2015-12-28 DIAGNOSIS — R202 Paresthesia of skin: Secondary | ICD-10-CM

## 2015-12-28 DIAGNOSIS — R5382 Chronic fatigue, unspecified: Secondary | ICD-10-CM

## 2015-12-28 LAB — CBC WITH DIFFERENTIAL/PLATELET
HEMATOCRIT: 34.9 % (ref 34.0–46.6)
Hemoglobin: 11.4 g/dL (ref 11.1–15.9)
LYMPHS ABS: 2.1 10*3/uL (ref 0.7–3.1)
Lymphs: 40 %
MCH: 26.8 pg (ref 26.6–33.0)
MCHC: 32.7 g/dL (ref 31.5–35.7)
MCV: 82 fL (ref 79–97)
MID (ABSOLUTE): 0.6 10*3/uL (ref 0.1–1.6)
MID: 10 %
Neutrophils Absolute: 2.7 10*3/uL (ref 1.4–7.0)
Neutrophils: 50 %
PLATELETS: 261 10*3/uL (ref 150–379)
RBC: 4.25 x10E6/uL (ref 3.77–5.28)
RDW: 15.7 % — AB (ref 12.3–15.4)
WBC: 5.4 10*3/uL (ref 3.4–10.8)

## 2015-12-28 LAB — BAYER DCA HB A1C WAIVED: HB A1C: 5 % (ref <7.0)

## 2015-12-29 ENCOUNTER — Telehealth: Payer: Self-pay | Admitting: Family Medicine

## 2015-12-29 LAB — CORTISOL: CORTISOL: 11.2 ug/dL

## 2015-12-29 LAB — COMPREHENSIVE METABOLIC PANEL
A/G RATIO: 1.5 (ref 1.2–2.2)
ALBUMIN: 4.2 g/dL (ref 3.5–5.5)
ALK PHOS: 82 IU/L (ref 39–117)
ALT: 10 IU/L (ref 0–32)
AST: 16 IU/L (ref 0–40)
BILIRUBIN TOTAL: 0.3 mg/dL (ref 0.0–1.2)
BUN / CREAT RATIO: 19 (ref 9–23)
BUN: 15 mg/dL (ref 6–24)
CO2: 26 mmol/L (ref 18–29)
Calcium: 9.3 mg/dL (ref 8.7–10.2)
Chloride: 102 mmol/L (ref 96–106)
Creatinine, Ser: 0.81 mg/dL (ref 0.57–1.00)
GFR calc Af Amer: 103 mL/min/{1.73_m2} (ref >59)
GFR calc non Af Amer: 89 mL/min/{1.73_m2} (ref >59)
GLOBULIN, TOTAL: 2.8 g/dL (ref 1.5–4.5)
Glucose: 69 mg/dL (ref 65–99)
POTASSIUM: 4 mmol/L (ref 3.5–5.2)
SODIUM: 145 mmol/L — AB (ref 134–144)
Total Protein: 7 g/dL (ref 6.0–8.5)

## 2015-12-29 LAB — ACTH: ACTH: 21.3 pg/mL (ref 7.2–63.3)

## 2015-12-29 LAB — VITAMIN D 25 HYDROXY (VIT D DEFICIENCY, FRACTURES): Vit D, 25-Hydroxy: 24.1 ng/mL — ABNORMAL LOW (ref 30.0–100.0)

## 2015-12-29 NOTE — Telephone Encounter (Signed)
Called and [facility] for her to call back. Labs look normal except vitamin D which was low. Should increase OTC vitamin D (2000 IU daily). Everything else looked beautiful! A1c is 5.0! OK to give her this message if she calls.

## 2016-01-01 NOTE — Telephone Encounter (Signed)
Patient notified

## 2016-01-22 ENCOUNTER — Ambulatory Visit (INDEPENDENT_AMBULATORY_CARE_PROVIDER_SITE_OTHER): Payer: Self-pay | Admitting: Family Medicine

## 2016-01-22 ENCOUNTER — Encounter: Payer: Self-pay | Admitting: Family Medicine

## 2016-01-22 VITALS — BP 130/80 | HR 87 | Temp 98.3°F | Ht 67.1 in | Wt 162.0 lb

## 2016-01-22 DIAGNOSIS — I10 Essential (primary) hypertension: Secondary | ICD-10-CM

## 2016-01-22 DIAGNOSIS — M797 Fibromyalgia: Secondary | ICD-10-CM

## 2016-01-22 NOTE — Patient Instructions (Addendum)
- Start a medication called venlafaxine 37.5 mg tablets: You'll take 1 tablet twice a day for 2 weeks, then increase to 2 tablets in the morning and 1 at night for 2 weeks, then 2 tablets twice a day and continue   Venlafaxine tablets What is this medicine? VENLAFAXINE (VEN la fax een) is used to treat depression, anxiety and panic disorder. This medicine may be used for other purposes; ask your health care provider or pharmacist if you have questions. What should I tell my health care provider before I take this medicine? They need to know if you have any of these conditions: -bleeding disorders -glaucoma -heart disease -high blood pressure -high cholesterol -kidney disease -liver disease -low levels of sodium in the blood -mania or bipolar disorder -seizures -suicidal thoughts, plans, or attempt; a previous suicide attempt by you or a family -take medicines that treat or prevent blood clots -thyroid disease -an unusual or allergic reaction to venlafaxine, desvenlafaxine, other medicines, foods, dyes, or preservatives -pregnant or trying to get pregnant -breast-feeding How should I use this medicine? Take this medicine by mouth with a glass of water. Follow the directions on the prescription label. Take it with food. Take your medicine at regular intervals. Do not take your medicine more often than directed. Do not stop taking this medicine suddenly except upon the advice of your doctor. Stopping this medicine too quickly may cause serious side effects or your condition may worsen. A special MedGuide will be given to you by the pharmacist with each prescription and refill. Be sure to read this information carefully each time. Talk to your pediatrician regarding the use of this medicine in children. Special care may be needed. Overdosage: If you think you have taken too much of this medicine contact a poison control center or emergency room at once. NOTE: This medicine is only for you.  Do not share this medicine with others. What if I miss a dose? If you miss a dose, take it as soon as you can. If it is almost time for your next dose, take only that dose. Do not take double or extra doses. What may interact with this medicine? Do not take this medicine with any of the following medications: -certain medicines for fungal infections like fluconazole, itraconazole, ketoconazole, posaconazole, voriconazole -cisapride -desvenlafaxine -dofetilide -dronedarone -duloxetine -levomilnacipran -linezolid -MAOIs like Carbex, Eldepryl, Marplan, Nardil, and Parnate -methylene blue (injected into a vein) -milnacipran -pimozide -thioridazine -ziprasidone This medicine may also interact with the following medications: -aspirin and aspirin-like medicines -certain medicines for depression, anxiety, or psychotic disturbances -certain medicines for migraine headaches like almotriptan, eletriptan, frovatriptan, naratriptan, rizatriptan, sumatriptan, zolmitriptan -certain medicines for sleep -certain medicines that treat or prevent blood clots like dalteparin, enoxaparin, warfarin -cimetidine -clozapine -diuretics -fentanyl -furazolidone -indinavir -isoniazid -lithium -metoprolol -NSAIDS, medicines for pain and inflammation, like ibuprofen or naproxen -other medicines that prolong the QT interval (cause an abnormal heart rhythm) -procarbazine -rasagiline -supplements like St. John's wort, kava kava, valerian -tramadol -tryptophan This list may not describe all possible interactions. Give your health care provider a list of all the medicines, herbs, non-prescription drugs, or dietary supplements you use. Also tell them if you smoke, drink alcohol, or use illegal drugs. Some items may interact with your medicine. What should I watch for while using this medicine? Tell your doctor if your symptoms do not get better or if they get worse. Visit your doctor or health care professional  for regular checks on your progress. Because it may take  several weeks to see the full effects of this medicine, it is important to continue your treatment as prescribed by your doctor. Patients and their families should watch out for new or worsening thoughts of suicide or depression. Also watch out for sudden changes in feelings such as feeling anxious, agitated, panicky, irritable, hostile, aggressive, impulsive, severely restless, overly excited and hyperactive, or not being able to sleep. If this happens, especially at the beginning of treatment or after a change in dose, call your health care professional. This medicine can cause an increase in blood pressure. Check with your doctor for instructions on monitoring your blood pressure while taking this medicine. You may get drowsy or dizzy. Do not drive, use machinery, or do anything that needs mental alertness until you know how this medicine affects you. Do not stand or sit up quickly, especially if you are an older patient. This reduces the risk of dizzy or fainting spells. Alcohol may interfere with the effect of this medicine. Avoid alcoholic drinks. Your mouth may get dry. Chewing sugarless gum, sucking hard candy and drinking plenty of water will help. Contact your doctor if the problem does not go away or is severe. What side effects may I notice from receiving this medicine? Side effects that you should report to your doctor or health care professional as soon as possible: -allergic reactions like skin rash, itching or hives, swelling of the face, lips, or tongue -breathing problems -changes in vision -seizures -suicidal thoughts or other mood changes -trouble passing urine or change in the amount of urine -unusual bleeding or bruising Side effects that usually do not require medical attention (report to your doctor or health care professional if they continue or are bothersome): -change in sex drive or  performance -constipation -increased sweating -loss of appetite -nausea -tremors -weight loss This list may not describe all possible side effects. Call your doctor for medical advice about side effects. You may report side effects to FDA at 1-800-FDA-1088. Where should I keep my medicine? Keep out of the reach of children. Store at a controlled temperature between 20 and 25 degrees C (68 and 77 degrees F), in a dry place. Throw away any unused medicine after the expiration date. NOTE: This sheet is a summary. It may not cover all possible information. If you have questions about this medicine, talk to your doctor, pharmacist, or health care provider.    2016, Elsevier/[employer] Standard. (2013-04-13 12:43:55)

## 2016-01-22 NOTE — Assessment & Plan Note (Signed)
Better on recheck. Continue to monitor.  

## 2016-01-22 NOTE — Progress Notes (Signed)
BP 130/80 mmHg  Pulse 87  Temp(Src) 98.3 F (36.8 C)  Ht 5' 7.1" (1.704 m)  Wt 162 lb (73.483 kg)  BMI 25.31 kg/m2  SpO2 99%   Subjective:    Patient ID: Tina Harvey, female    DOB: 11/25/1970, 45 y.o.   MRN: [id number]  HPI: Tina Harvey is a 45 y.o. female  Chief Complaint  Patient presents with  . Fibromyalgia   Saw neurology on 01/05/16- Really liked him. 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 Recommended increasing effexor, rheumatology eval, PR and ?skin biopsy for SFN. ? Also whether migraine auras are contributing to her symptoms. She is happy with how she was treated. She is anxious because she doesn't want to gain weight. She has not started the effexor yet because she wanted to know more about it. She is going to start it now. She is otherwise feeling well with no other concerns or complaints at this time.   Relevant past medical, surgical, family and social history reviewed and updated as indicated. Interim medical history since our last visit reviewed. Allergies and medications reviewed and updated.  Review of Systems  Respiratory: Negative.   Cardiovascular: Negative.   Skin: Negative.   Neurological: Positive for dizziness, weakness, light-headedness, numbness and headaches. Negative for tremors, seizures, syncope, facial asymmetry and speech difficulty.  Psychiatric/Behavioral: Negative.     Per HPI unless specifically indicated above     Objective:    BP 130/80 mmHg  Pulse 87  Temp(Src) 98.3 F (36.8 C)  Ht 5' 7.1" (1.704 m)  Wt 162 lb (73.483 kg)  BMI 25.31 kg/m2  SpO2 99%  Wt Readings from Last 3 Encounters:  01/22/16 162 lb (73.483 kg)  12/22/15 161 lb 12.8 oz (73.392 kg)  11/13/15 161 lb (73.029 kg)    Physical Exam  Constitutional: She is oriented to person, place, and time. She appears well-developed and well-nourished. No distress.  HENT:  Head: Normocephalic and atraumatic.  Right Ear: Hearing normal.  Left  Ear: Hearing normal.  Nose: Nose normal.  Eyes: Conjunctivae and lids are normal. Right eye exhibits no discharge. Left eye exhibits no discharge. No scleral icterus.  Cardiovascular: Normal rate, regular rhythm, normal heart sounds and intact distal pulses.  Exam reveals no gallop and no friction rub.   No murmur heard. Pulmonary/Chest: Effort normal and breath sounds normal. No respiratory distress. She has no wheezes. She has no rales. She exhibits no tenderness.  Musculoskeletal: Normal range of motion.  Neurological: She is alert and oriented to person, place, and time.  Skin: Skin is warm, dry and intact. No rash noted. No erythema. No pallor.  Psychiatric: She has a normal mood and affect. Her speech is normal and behavior is normal. Judgment and thought content normal. Cognition and memory are normal.  Nursing note and vitals reviewed.   Results for orders placed or performed in visit on 12/28/15  VITAMIN D 25 Hydroxy (Vit-D Deficiency, Fractures)  Result Value Ref Range   Vit D, 25-Hydroxy 24.1 (L) 30.0 - 100.0 ng/mL  Comprehensive metabolic panel  Result Value Ref Range   Glucose 69 65 - 99 mg/dL   BUN 15 6 - 24 mg/dL   Creatinine, Ser 0.81 0.57 - 1.00 mg/dL   GFR calc non Af Amer 89 >59 mL/min/1.73   GFR calc Af Amer 103 >59 mL/min/1.73   BUN/Creatinine Ratio 19 9 - 23   Sodium 145 (H) 134 - 144 mmol/L   Potassium 4.0  3.5 - 5.2 mmol/L   Chloride 102 96 - 106 mmol/L   CO2 26 18 - 29 mmol/L   Calcium 9.3 8.7 - 10.2 mg/dL   Total Protein 7.0 6.0 - 8.5 g/dL   Albumin 4.2 3.5 - 5.5 g/dL   Globulin, Total 2.8 1.5 - 4.5 g/dL   Albumin/Globulin Ratio 1.5 1.2 - 2.2   Bilirubin Total 0.3 0.0 - 1.2 mg/dL   Alkaline Phosphatase 82 39 - 117 IU/L   AST 16 0 - 40 IU/L   ALT 10 0 - 32 IU/L  CBC With Differential/Platelet  Result Value Ref Range   WBC 5.4 3.4 - 10.8 x10E3/uL   RBC 4.25 3.77 - 5.28 x10E6/uL   Hemoglobin 11.4 11.1 - 15.9 g/dL   Hematocrit 34.9 34.0 - 46.6 %   MCV  82 79 - 97 fL   MCH 26.8 26.6 - 33.0 pg   MCHC 32.7 31.5 - 35.7 g/dL   RDW 15.7 (H) 12.3 - 15.4 %   Platelets 261 150 - 379 x10E3/uL   Neutrophils 50 %   Lymphs 40 %   MID 10 %   Neutrophils Absolute 2.7 1.4 - 7.0 x10E3/uL   Lymphocytes Absolute 2.1 0.7 - 3.1 x10E3/uL   MID (Absolute) 0.6 0.1 - 1.6 X10E3/uL  Cortisol  Result Value Ref Range   Cortisol 11.2 ug/dL  ACTH  Result Value Ref Range   ACTH 21.3 7.2 - 63.3 pg/mL  Bayer DCA Hb A1c Waived  Result Value Ref Range   Bayer DCA Hb A1c Waived 5.0 <7.0 %      Assessment & Plan:   Problem List Items Addressed This Visit      Cardiovascular and Mediastinum   Essential hypertension, benign    Better on recheck. Continue to monitor.         Musculoskeletal and Integument   Fibromyalgia - Primary    Will start the effexor. Continue to work with Social worker. Continue to monitor. Continue to follow with neurology and to see rheumatology. Call with any concerns.           Follow up plan: Return in about 4 weeks (around 02/19/2016) for Follow up.

## 2016-01-22 NOTE — Assessment & Plan Note (Signed)
Will start the effexor. Continue to work with Social worker. Continue to monitor. Continue to follow with neurology and to see rheumatology. Call with any concerns.

## 2016-02-10 IMAGING — MR MRI OF THE RIGHT SHOULDER WITHOUT CONTRAST
5 series · 40 of 40 positions shown · non-contrast
Comparison: DG SHOULDER 3+V*R* dated 12/04/2013

CLINICAL DATA: Right shoulder pain.  Fall.

EXAM:
MRI OF THE RIGHT SHOULDER WITHOUT CONTRAST
TECHNIQUE: Multiplanar, multisequence MR imaging of the shoulder was performed.
No intravenous contrast was administered.

[Series 3: T2 fat-sat · axial · 4.0mm · 0.55mm/px · z∈[+14,+97]mm · 8 of 22 slices shown (1 of 2)]
[im 1/22]
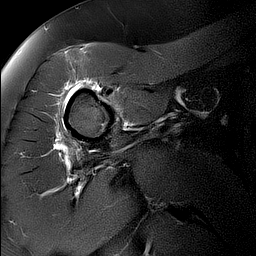
[im 4/22]
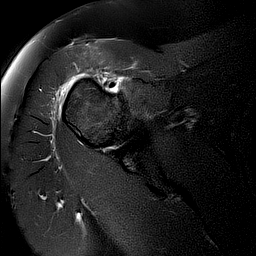
[im 7/22]
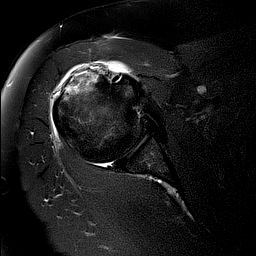
[im 10/22]
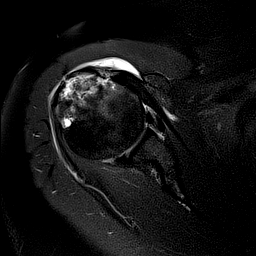
[im 13/22]
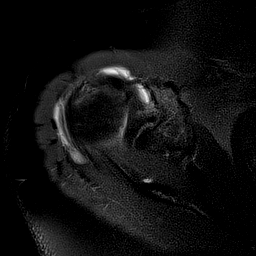
[im 16/22]
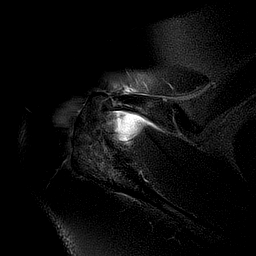
[im 19/22]
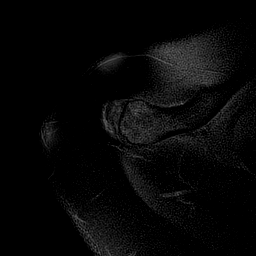
[im 22/22]
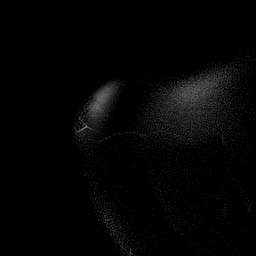

[Series 4: T2 · oblique · 4.0mm · 0.55mm/px · 8 of 21 slices shown]
[im 1/21]
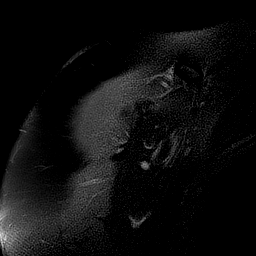
[im 3/21]
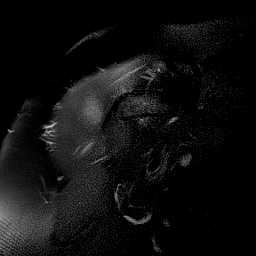
[im 6/21]
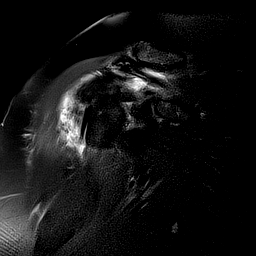
[im 9/21]
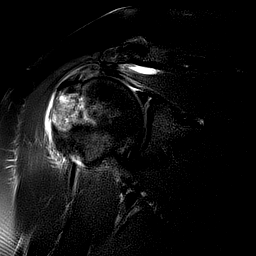
[im 12/21]
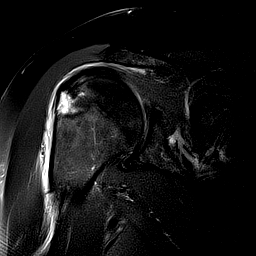
[im 15/21]
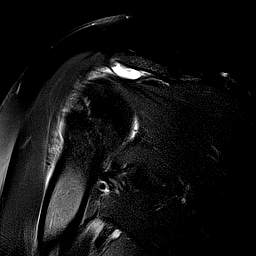
[im 18/21]
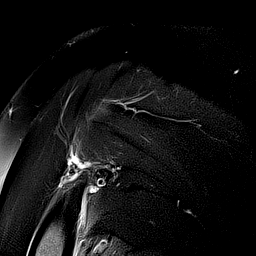
[im 21/21]
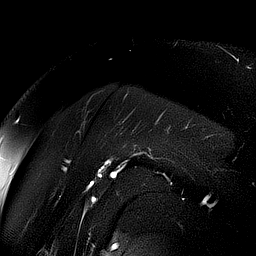

[Series 5: PD · oblique · 4.0mm · 0.62mm/px · 8 of 21 slices shown]
[im 1/21]
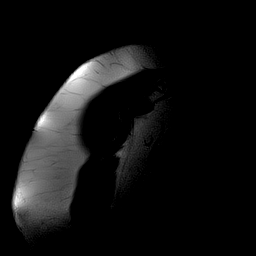
[im 3/21]
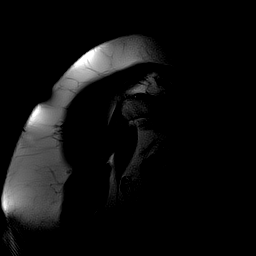
[im 6/21]
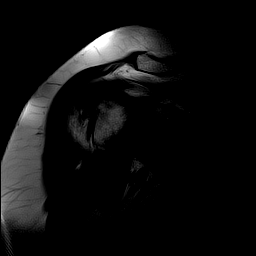
[im 9/21]
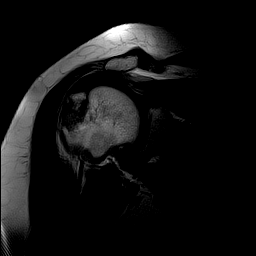
[im 12/21]
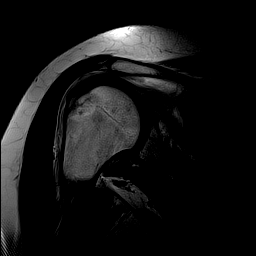
[im 15/21]
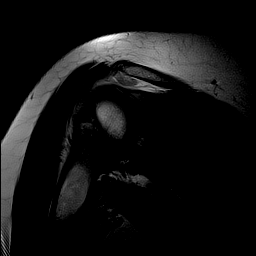
[im 18/21]
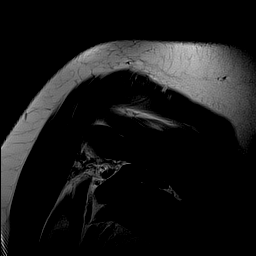
[im 21/21]
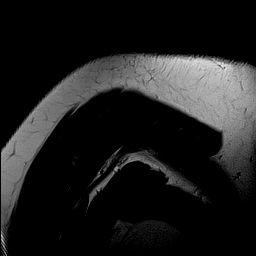

[Series 6: T1 · oblique · 4.0mm · 0.55mm/px · 8 of 22 slices shown]
[im 1/22]
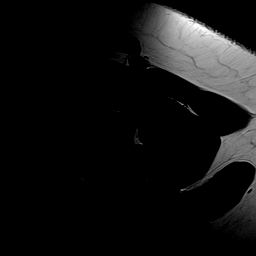
[im 4/22]
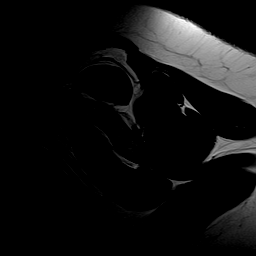
[im 7/22]
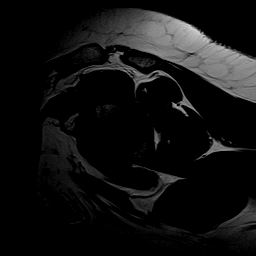
[im 10/22]
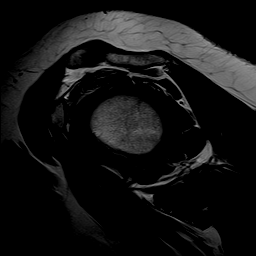
[im 13/22]
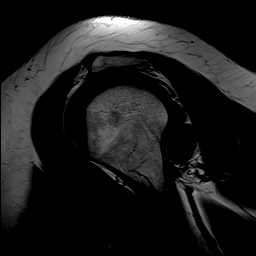
[im 16/22]
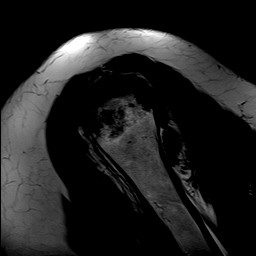
[im 19/22]
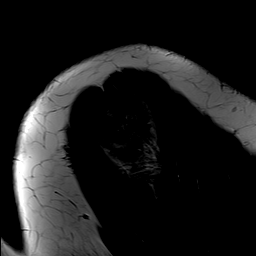
[im 22/22]
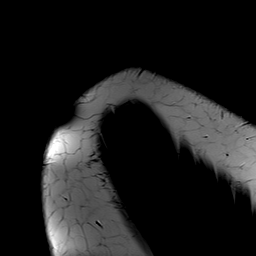

[Series 7: T2 fat-sat · oblique · 4.0mm · 0.55mm/px · 8 of 22 slices shown (2 of 2)]
[im 1/22]
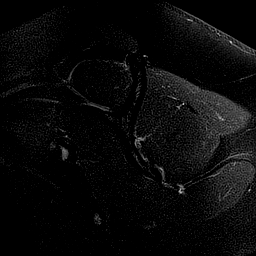
[im 4/22]
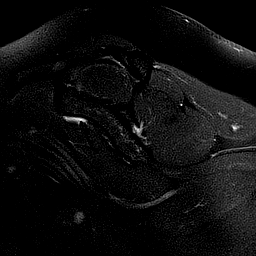
[im 7/22]
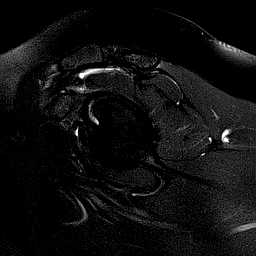
[im 10/22]
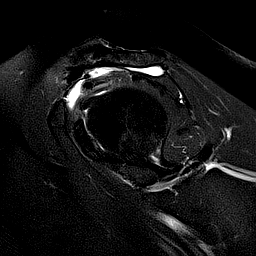
[im 13/22]
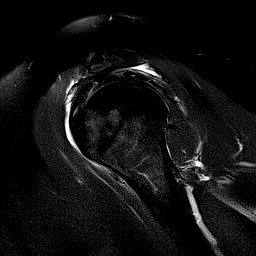
[im 16/22]
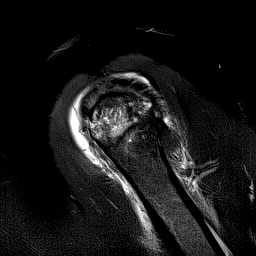
[im 19/22]
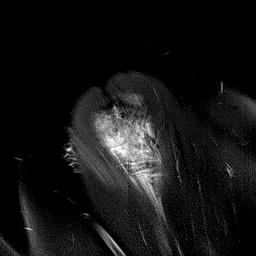
[im 22/22]
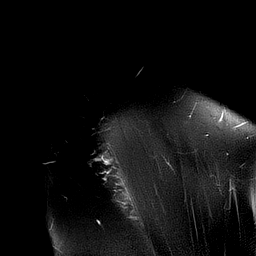

[40 of 40 positions shown; findings below may reference images not displayed]

FINDINGS: Rotator cuff: Moderate supraspinatus tendinopathy with mild
infraspinatus and subscapularis tendinopathy.

Muscles:  Infiltrative edema anteriorly in the deltoid.

Biceps long head:  Intact

Acromioclavicular Joint: Intact. There is abnormal fluid in the
subacromial subdeltoid bursa, likely blood products given the high
T1 signal.

Glenohumeral Joint: Intact

Labrum:  Grossly intact

Bones: Acute nondisplaced fracture of the greater tuberosity noted
involving a 2.8 x 1.6 x 3.7 cm fragment.
IMPRESSION: 1. Acute nondisplaced greater tuberosity fracture. Blood products
are noted in the subacromial subdeltoid bursa.
2. Moderate supraspinatus with mild infraspinatus and subscapularis
tendinopathy.
3. Infiltrative edema anteriorly in the deltoid, likely representing
muscle strain.

## 2016-02-19 ENCOUNTER — Encounter: Payer: Self-pay | Admitting: Family Medicine

## 2016-02-19 ENCOUNTER — Ambulatory Visit (INDEPENDENT_AMBULATORY_CARE_PROVIDER_SITE_OTHER): Payer: Self-pay | Admitting: Family Medicine

## 2016-02-19 VITALS — BP 116/79 | HR 75 | Temp 98.4°F | Ht 67.0 in | Wt 163.0 lb

## 2016-02-19 DIAGNOSIS — G43009 Migraine without aura, not intractable, without status migrainosus: Secondary | ICD-10-CM

## 2016-02-19 DIAGNOSIS — M797 Fibromyalgia: Secondary | ICD-10-CM

## 2016-02-19 MED ORDER — HYDROCODONE-ACETAMINOPHEN 5-325 MG PO TABS: 1.0000 | ORAL_TABLET | Freq: Four times a day (QID) | ORAL | Status: DC | PRN

## 2016-02-19 MED ORDER — ESCITALOPRAM OXALATE 5 MG PO TABS: 5.0000 mg | ORAL_TABLET | Freq: Every day | ORAL | Status: DC

## 2016-02-19 NOTE — Progress Notes (Signed)
BP 116/79 mmHg  Pulse 75  Temp(Src) 98.4 F (36.9 C)  Ht 5\' 7"  (1.702 m)  Wt 163 lb (73.936 kg)  BMI 25.52 kg/m2  SpO2 98%   Subjective:    Patient ID: Tina Harvey, female    DOB: [birth year], 45 y.o.   MRN: [id number]  HPI: Tina Harvey is a 45 y.o. female  Chief Complaint  Patient presents with  . Follow-up  . Fibromyalgia   FIBROMYALGIA- feeling sick on her stomach with the effexor- was throwing up on it. Makes her nervous because it made her gain weight Pain status: uncontrolled Satisfied with current treatment?: no Medication side effects: yes Medication compliance: poor compliance- had to stop them because of the nausea Duration: chronic Location: every where Quality: aching  Current pain level: moderate Previous pain level: moderate Aggravating factors: staying still, changes in barometric pressure Alleviating factors: rest, ice, heat and narcotics Previous pain specialty evaluation: no Non-narcotic analgesic meds: yes Narcotic contract:no Treatments attempted: effexor, massage, exercise, weight loss  Migraines are getting worse. Only thing she can take right now is the norco. Due to see neurology again soon. Needs bridging Rx until she gets in to see them.   Relevant past medical, surgical, family and social history reviewed and updated as indicated. Interim medical history since our last visit reviewed. Allergies and medications reviewed and updated.  Review of Systems  Constitutional: Negative.   Respiratory: Negative.   Cardiovascular: Negative.   Psychiatric/Behavioral: Negative.     Per HPI unless specifically indicated above     Objective:    BP 116/79 mmHg  Pulse 75  Temp(Src) 98.4 F (36.9 C)  Ht 5\' 7"  (1.702 m)  Wt 163 lb (73.936 kg)  BMI 25.52 kg/m2  SpO2 98%  Wt Readings from Last 3 Encounters:  02/19/16 163 lb (73.936 kg)  01/22/16 162 lb (73.483 kg)  12/22/15 161 lb 12.8 oz (73.392 kg)    Physical Exam  Constitutional: She is  oriented to person, place, and time. She appears well-developed and well-nourished. No distress.  HENT:  Head: Normocephalic and atraumatic.  Right Ear: Hearing normal.  Left Ear: Hearing normal.  Nose: Nose normal.  Eyes: Conjunctivae and lids are normal. Right eye exhibits no discharge. Left eye exhibits no discharge. No scleral icterus.  Pulmonary/Chest: Effort normal. No respiratory distress.  Musculoskeletal: Normal range of motion.  Neurological: She is alert and oriented to person, place, and time.  Skin: Skin is warm, dry and intact. No rash noted. She is not diaphoretic. No erythema. No pallor.  Psychiatric: She has a normal mood and affect. Her speech is normal and behavior is normal. Judgment and thought content normal. Cognition and memory are normal.  Nursing note and vitals reviewed.   Results for orders placed or performed in visit on 12/28/15  VITAMIN D 25 Hydroxy (Vit-D Deficiency, Fractures)  Result Value Ref Range   Vit D, 25-Hydroxy 24.1 (L) 30.0 - 100.0 ng/mL  Comprehensive metabolic panel  Result Value Ref Range   Glucose 69 65 - 99 mg/dL   BUN 15 6 - 24 mg/dL   Creatinine, Ser 0.81 0.57 - 1.00 mg/dL   GFR calc non Af Amer 89 >59 mL/min/1.73   GFR calc Af Amer 103 >59 mL/min/1.73   BUN/Creatinine Ratio 19 9 - 23   Sodium 145 (H) 134 - 144 mmol/L   Potassium 4.0 3.5 - 5.2 mmol/L   Chloride 102 96 - 106 mmol/L   CO2 26 18 - 29  mmol/L   Calcium 9.3 8.7 - 10.2 mg/dL   Total Protein 7.0 6.0 - 8.5 g/dL   Albumin 4.2 3.5 - 5.5 g/dL   Globulin, Total 2.8 1.5 - 4.5 g/dL   Albumin/Globulin Ratio 1.5 1.2 - 2.2   Bilirubin Total 0.3 0.0 - 1.2 mg/dL   Alkaline Phosphatase 82 39 - 117 IU/L   AST 16 0 - 40 IU/L   ALT 10 0 - 32 IU/L  CBC With Differential/Platelet  Result Value Ref Range   WBC 5.4 3.4 - 10.8 x10E3/uL   RBC 4.25 3.77 - 5.28 x10E6/uL   Hemoglobin 11.4 11.1 - 15.9 g/dL   Hematocrit 34.9 34.0 - 46.6 %   MCV 82 79 - 97 fL   MCH 26.8 26.6 - 33.0 pg    MCHC 32.7 31.5 - 35.7 g/dL   RDW 15.7 (H) 12.3 - 15.4 %   Platelets 261 150 - 379 x10E3/uL   Neutrophils 50 %   Lymphs 40 %   MID 10 %   Neutrophils Absolute 2.7 1.4 - 7.0 x10E3/uL   Lymphocytes Absolute 2.1 0.7 - 3.1 x10E3/uL   MID (Absolute) 0.6 0.1 - 1.6 X10E3/uL  Cortisol  Result Value Ref Range   Cortisol 11.2 ug/dL  ACTH  Result Value Ref Range   ACTH 21.3 7.2 - 63.3 pg/mL  Bayer DCA Hb A1c Waived  Result Value Ref Range   Bayer DCA Hb A1c Waived 5.0 <7.0 %      Assessment & Plan:   Problem List Items Addressed This Visit      Cardiovascular and Mediastinum   Migraine without aura and without status migrainosus, not intractable    Refill of norco given today. Follow up with neurology as needed.       Relevant Medications   escitalopram (LEXAPRO) 5 MG tablet   HYDROcodone-acetaminophen (NORCO) 5-325 MG tablet     Musculoskeletal and Integument   Fibromyalgia - Primary    Did not tolerate the effexor. Will change to lexapro. Call with concerns. Follow up 3-4 weeks to see how she does.           Follow up plan: Return 3-4 weeks, for follow up fibro/lexapro.

## 2016-02-19 NOTE — Assessment & Plan Note (Signed)
Did not tolerate the effexor. Will change to lexapro. Call with concerns. Follow up 3-4 weeks to see how she does.

## 2016-02-19 NOTE — Assessment & Plan Note (Signed)
Refill of norco given today. Follow up with neurology as needed.

## 2016-03-21 ENCOUNTER — Ambulatory Visit (INDEPENDENT_AMBULATORY_CARE_PROVIDER_SITE_OTHER): Payer: Self-pay | Admitting: Family Medicine

## 2016-03-21 ENCOUNTER — Encounter: Payer: Self-pay | Admitting: Family Medicine

## 2016-03-21 VITALS — BP 129/86 | HR 68 | Temp 97.6°F | Ht 67.0 in | Wt 160.0 lb

## 2016-03-21 DIAGNOSIS — D229 Melanocytic nevi, unspecified: Secondary | ICD-10-CM

## 2016-03-21 DIAGNOSIS — M797 Fibromyalgia: Secondary | ICD-10-CM

## 2016-03-21 MED ORDER — ESCITALOPRAM OXALATE 10 MG PO TABS: 10.0000 mg | ORAL_TABLET | Freq: Every day | ORAL | Status: DC

## 2016-03-21 NOTE — Progress Notes (Signed)
BP 129/86 mmHg  Pulse 68  Temp(Src) 97.6 F (36.4 C)  Ht 5\' 7"  (1.702 m)  Wt 160 lb (72.576 kg)  BMI 25.05 kg/m2  SpO2 100%   Subjective:    Patient ID: Tina Harvey, female    DOB: 03/28/1971, 45 y.o.   MRN: [id number]  HPI: Tina Harvey is a 45 y.o. female  Chief Complaint  Patient presents with  . Fibromyalgia   FIBROMYALGIA- medicine seems to be doing OK, takes care of the pain but makes her feel more anxious Pain status: better Satisfied with current treatment?: no Medication side effects: yes- feeling a little anxious, but pain is better Medication compliance: excellent compliance Duration: chronic Location: widespread Quality: aching Current pain level: moderate Previous pain level: severe Aggravating factors: certain activities, weather changes Alleviating factors: acitivities Previous pain specialty evaluation: no Non-narcotic analgesic meds: yes Narcotic contract:no Treatments attempted: rest, ice, heat, APAP, ibuprofen, aleve, physical therapy and HEP  Relevant past medical, surgical, family and social history reviewed and updated as indicated. Interim medical history since our last visit reviewed. Allergies and medications reviewed and updated.  Review of Systems  Constitutional: Negative.   Respiratory: Negative.   Cardiovascular: Negative.   Musculoskeletal: Positive for myalgias. Negative for back pain, joint swelling, arthralgias, gait problem, neck pain and neck stiffness.    Per HPI unless specifically indicated above     Objective:    BP 129/86 mmHg  Pulse 68  Temp(Src) 97.6 F (36.4 C)  Ht 5\' 7"  (1.702 m)  Wt 160 lb (72.576 kg)  BMI 25.05 kg/m2  SpO2 100%  Wt Readings from Last 3 Encounters:  03/21/16 160 lb (72.576 kg)  02/19/16 163 lb (73.936 kg)  01/22/16 162 lb (73.483 kg)    Physical Exam  Constitutional: She is oriented to person, place, and time. She appears well-developed and well-nourished. No distress.  HENT:  Head:  Normocephalic and atraumatic.  Right Ear: Hearing normal.  Left Ear: Hearing normal.  Nose: Nose normal.  Eyes: Conjunctivae and lids are normal. Right eye exhibits no discharge. Left eye exhibits no discharge. No scleral icterus.  Cardiovascular: Normal rate, regular rhythm, normal heart sounds and intact distal pulses.  Exam reveals no gallop and no friction rub.   No murmur heard. Pulmonary/Chest: Effort normal and breath sounds normal. No respiratory distress. She has no wheezes. She has no rales. She exhibits no tenderness.  Musculoskeletal: Normal range of motion.  Neurological: She is alert and oriented to person, place, and time.  Skin: Skin is warm, dry and intact. No rash noted. She is not diaphoretic. No erythema. No pallor.  1 inch hyperpigmented lesion on R inner thigh with areas of darker pigment within and a hair  Psychiatric: She has a normal mood and affect. Her speech is normal and behavior is normal. Judgment and thought content normal. Cognition and memory are normal.  Nursing note and vitals reviewed.   Results for orders placed or performed in visit on 12/28/15  VITAMIN D 25 Hydroxy (Vit-D Deficiency, Fractures)  Result Value Ref Range   Vit D, 25-Hydroxy 24.1 (L) 30.0 - 100.0 ng/mL  Comprehensive metabolic panel  Result Value Ref Range   Glucose 69 65 - 99 mg/dL   BUN 15 6 - 24 mg/dL   Creatinine, Ser 0.81 0.57 - 1.00 mg/dL   GFR calc non Af Amer 89 >59 mL/min/1.73   GFR calc Af Amer 103 >59 mL/min/1.73   BUN/Creatinine Ratio 19 9 - 23  Sodium 145 (H) 134 - 144 mmol/L   Potassium 4.0 3.5 - 5.2 mmol/L   Chloride 102 96 - 106 mmol/L   CO2 26 18 - 29 mmol/L   Calcium 9.3 8.7 - 10.2 mg/dL   Total Protein 7.0 6.0 - 8.5 g/dL   Albumin 4.2 3.5 - 5.5 g/dL   Globulin, Total 2.8 1.5 - 4.5 g/dL   Albumin/Globulin Ratio 1.5 1.2 - 2.2   Bilirubin Total 0.3 0.0 - 1.2 mg/dL   Alkaline Phosphatase 82 39 - 117 IU/L   AST 16 0 - 40 IU/L   ALT 10 0 - 32 IU/L  CBC With  Differential/Platelet  Result Value Ref Range   WBC 5.4 3.4 - 10.8 x10E3/uL   RBC 4.25 3.77 - 5.28 x10E6/uL   Hemoglobin 11.4 11.1 - 15.9 g/dL   Hematocrit 34.9 34.0 - 46.6 %   MCV 82 79 - 97 fL   MCH 26.8 26.6 - 33.0 pg   MCHC 32.7 31.5 - 35.7 g/dL   RDW 15.7 (H) 12.3 - 15.4 %   Platelets 261 150 - 379 x10E3/uL   Neutrophils 50 %   Lymphs 40 %   MID 10 %   Neutrophils Absolute 2.7 1.4 - 7.0 x10E3/uL   Lymphocytes Absolute 2.1 0.7 - 3.1 x10E3/uL   MID (Absolute) 0.6 0.1 - 1.6 X10E3/uL  Cortisol  Result Value Ref Range   Cortisol 11.2 ug/dL  ACTH  Result Value Ref Range   ACTH 21.3 7.2 - 63.3 pg/mL  Bayer DCA Hb A1c Waived  Result Value Ref Range   Bayer DCA Hb A1c Waived 5.0 <7.0 %      Assessment & Plan:   Problem List Items Addressed This Visit      Musculoskeletal and Integument   Fibromyalgia - Primary    Improved on lexapro. Continue current regimen. Continue to monitor. Call with any problems.        Other Visit Diagnoses    Nevus        Will watch it. If it changes at all will take punch biopsy and send out.        Follow up plan: Return 4-6 weeks, for follow up fibro.

## 2016-03-21 NOTE — Assessment & Plan Note (Signed)
Improved on lexapro. Continue current regimen. Continue to monitor. Call with any problems.

## 2016-04-17 DIAGNOSIS — M797 Fibromyalgia: Secondary | ICD-10-CM | POA: Insufficient documentation

## 2016-04-25 ENCOUNTER — Other Ambulatory Visit: Payer: Self-pay | Admitting: Family Medicine

## 2016-04-30 ENCOUNTER — Ambulatory Visit: Payer: Self-pay | Admitting: Family Medicine

## 2016-06-14 ENCOUNTER — Telehealth: Payer: Self-pay | Admitting: Family Medicine

## 2016-06-14 ENCOUNTER — Telehealth: Payer: Self-pay

## 2016-06-14 MED ORDER — FLUCONAZOLE 150 MG PO TABS: 150.0000 mg | ORAL_TABLET | Freq: Once | ORAL | 1 refills | Status: AC

## 2016-06-14 NOTE — Telephone Encounter (Signed)
Patient notified

## 2016-06-14 NOTE — Telephone Encounter (Signed)
Patient needs Dr. [name] to call in [place]. Pt stated she has started to develop a yeast infection. Pharm is [facility] on [address]. Thanks.

## 2016-06-14 NOTE — Telephone Encounter (Signed)
Can you do this or does the patient need to be seen, if needs to be seen is it ok to schedule with Apolonio Schneiders.

## 2016-06-14 NOTE — Telephone Encounter (Signed)
Patient is requesting diflucan, can you give this or does she need to be seen, if needs to be seen can Apolonio Schneiders see her?

## 2016-06-14 NOTE — Telephone Encounter (Signed)
If this doesn't get her better she should come in.

## 2016-07-28 ENCOUNTER — Other Ambulatory Visit: Payer: Self-pay | Admitting: Unknown Physician Specialty

## 2016-07-30 ENCOUNTER — Encounter: Payer: Self-pay | Admitting: Family Medicine

## 2016-07-30 ENCOUNTER — Ambulatory Visit (INDEPENDENT_AMBULATORY_CARE_PROVIDER_SITE_OTHER): Payer: Self-pay | Admitting: Family Medicine

## 2016-07-30 ENCOUNTER — Telehealth: Payer: Self-pay | Admitting: Family Medicine

## 2016-07-30 VITALS — BP 122/88 | HR 66 | Temp 98.0°F | Wt 168.2 lb

## 2016-07-30 DIAGNOSIS — M797 Fibromyalgia: Secondary | ICD-10-CM

## 2016-07-30 MED ORDER — ESCITALOPRAM OXALATE 10 MG PO TABS: 10.0000 mg | ORAL_TABLET | Freq: Every day | ORAL | 1 refills | Status: DC

## 2016-07-30 NOTE — Progress Notes (Signed)
BP 122/88 (BP Location: Left Arm, Patient Position: Sitting, Cuff Size: Normal)   Pulse 66   Temp 98 F (36.7 C)   Wt 168 lb 3.2 oz (76.3 kg)   SpO2 98%   BMI 26.34 kg/m    Subjective:    Patient ID: Tina Harvey, female    DOB: 1971/08/07, 45 y.o.   MRN: [id number]  HPI: Tina Harvey is a 45 y.o. female  Chief Complaint  Patient presents with  . Fibromyalgia   FIBROMYALGIA Pain status: better Satisfied with current treatment?: yes Medication side effects: no Medication compliance: excellent compliance Duration: chronic Location: widespread Quality: aching Current pain level: mild Previous pain level: severe Aggravating factors: laying and prolonged sitting Alleviating factors: lexapro, rest, ice, heat, laying, NSAIDs and APAP Previous pain specialty evaluation: yes Non-narcotic analgesic meds: yes Narcotic contract:no Treatments attempted: lexapro, rest, ice, heat, APAP, ibuprofen, aleve, physical therapy and HEP   Relevant past medical, surgical, family and social history reviewed and updated as indicated. Interim medical history since our last visit reviewed. Allergies and medications reviewed and updated.  Review of Systems  Constitutional: Negative.   Respiratory: Negative.   Cardiovascular: Negative.   Psychiatric/Behavioral: Positive for confusion. Negative for agitation, behavioral problems, decreased concentration, dysphoric mood, hallucinations, self-injury, sleep disturbance and suicidal ideas. The patient is nervous/anxious. The patient is not hyperactive.     Per HPI unless specifically indicated above     Objective:    BP 122/88 (BP Location: Left Arm, Patient Position: Sitting, Cuff Size: Normal)   Pulse 66   Temp 98 F (36.7 C)   Wt 168 lb 3.2 oz (76.3 kg)   SpO2 98%   BMI 26.34 kg/m   Wt Readings from Last 3 Encounters:  07/30/16 168 lb 3.2 oz (76.3 kg)  03/21/16 160 lb (72.6 kg)  02/19/16 163 lb (73.9 kg)    Physical Exam    Constitutional: She is oriented to person, place, and time. She appears well-developed and well-nourished. No distress.  HENT:  Head: Normocephalic and atraumatic.  Right Ear: Hearing normal.  Left Ear: Hearing normal.  Nose: Nose normal.  Eyes: Conjunctivae and lids are normal. Right eye exhibits no discharge. Left eye exhibits no discharge. No scleral icterus.  Cardiovascular: Normal rate, regular rhythm, normal heart sounds and intact distal pulses.  Exam reveals no gallop and no friction rub.   No murmur heard. Pulmonary/Chest: Effort normal and breath sounds normal. No respiratory distress. She has no wheezes. She has no rales. She exhibits no tenderness.  Musculoskeletal: Normal range of motion.  Neurological: She is alert and oriented to person, place, and time.  Skin: Skin is warm, dry and intact. No rash noted. No erythema. No pallor.  Psychiatric: She has a normal mood and affect. Her speech is normal and behavior is normal. Judgment and thought content normal. Cognition and memory are normal.  Nursing note and vitals reviewed.   Results for orders placed or performed in visit on 12/28/15  VITAMIN D 25 Hydroxy (Vit-D Deficiency, Fractures)  Result Value Ref Range   Vit D, 25-Hydroxy 24.1 (L) 30.0 - 100.0 ng/mL  Comprehensive metabolic panel  Result Value Ref Range   Glucose 69 65 - 99 mg/dL   BUN 15 6 - 24 mg/dL   Creatinine, Ser 0.81 0.57 - 1.00 mg/dL   GFR calc non Af Amer 89 >59 mL/min/1.73   GFR calc Af Amer 103 >59 mL/min/1.73   BUN/Creatinine Ratio 19 9 - 23  Sodium 145 (H) 134 - 144 mmol/L   Potassium 4.0 3.5 - 5.2 mmol/L   Chloride 102 96 - 106 mmol/L   CO2 26 18 - 29 mmol/L   Calcium 9.3 8.7 - 10.2 mg/dL   Total Protein 7.0 6.0 - 8.5 g/dL   Albumin 4.2 3.5 - 5.5 g/dL   Globulin, Total 2.8 1.5 - 4.5 g/dL   Albumin/Globulin Ratio 1.5 1.2 - 2.2   Bilirubin Total 0.3 0.0 - 1.2 mg/dL   Alkaline Phosphatase 82 39 - 117 IU/L   AST 16 0 - 40 IU/L   ALT 10 0 - 32  IU/L  CBC With Differential/Platelet  Result Value Ref Range   WBC 5.4 3.4 - 10.8 x10E3/uL   RBC 4.25 3.77 - 5.28 x10E6/uL   Hemoglobin 11.4 11.1 - 15.9 g/dL   Hematocrit 34.9 34.0 - 46.6 %   MCV 82 79 - 97 fL   MCH 26.8 26.6 - 33.0 pg   MCHC 32.7 31.5 - 35.7 g/dL   RDW 15.7 (H) 12.3 - 15.4 %   Platelets 261 150 - 379 x10E3/uL   Neutrophils 50 %   Lymphs 40 %   MID 10 %   Neutrophils Absolute 2.7 1.4 - 7.0 x10E3/uL   Lymphocytes Absolute 2.1 0.7 - 3.1 x10E3/uL   MID (Absolute) 0.6 0.1 - 1.6 X10E3/uL  Cortisol  Result Value Ref Range   Cortisol 11.2 ug/dL  ACTH  Result Value Ref Range   ACTH 21.3 7.2 - 63.3 pg/mL  Bayer DCA Hb A1c Waived  Result Value Ref Range   Bayer DCA Hb A1c Waived 5.0 <7.0 %      Assessment & Plan:   Problem List Items Addressed This Visit      Other   Fibromyalgia - Primary    Doing much better on her lexapro. Continue current regimen. Continue to monitor. Recheck 6 months at physical.       Other Visit Diagnoses   None.      Follow up plan: Return in about 6 months (around 01/27/2017) for Physical.

## 2016-07-30 NOTE — Telephone Encounter (Signed)
Pt called to check her appt time for this morning.

## 2016-07-30 NOTE — Assessment & Plan Note (Signed)
Doing much better on her lexapro. Continue current regimen. Continue to monitor. Recheck 6 months at physical.

## 2016-08-27 ENCOUNTER — Other Ambulatory Visit: Payer: Self-pay | Admitting: Family Medicine

## 2016-12-31 ENCOUNTER — Other Ambulatory Visit: Payer: Self-pay | Admitting: Family Medicine

## 2017-01-28 ENCOUNTER — Encounter: Payer: Self-pay | Admitting: Family Medicine

## 2017-02-12 ENCOUNTER — Encounter: Payer: Self-pay | Admitting: Family Medicine

## 2017-02-12 ENCOUNTER — Ambulatory Visit (INDEPENDENT_AMBULATORY_CARE_PROVIDER_SITE_OTHER): Payer: Self-pay | Admitting: Family Medicine

## 2017-02-12 VITALS — BP 148/88 | HR 73 | Temp 97.7°F | Ht 67.32 in | Wt 188.4 lb

## 2017-02-12 DIAGNOSIS — G43009 Migraine without aura, not intractable, without status migrainosus: Secondary | ICD-10-CM

## 2017-02-12 DIAGNOSIS — Z0000 Encounter for general adult medical examination without abnormal findings: Secondary | ICD-10-CM

## 2017-02-12 DIAGNOSIS — E559 Vitamin D deficiency, unspecified: Secondary | ICD-10-CM

## 2017-02-12 DIAGNOSIS — G4733 Obstructive sleep apnea (adult) (pediatric): Secondary | ICD-10-CM

## 2017-02-12 DIAGNOSIS — N390 Urinary tract infection, site not specified: Secondary | ICD-10-CM

## 2017-02-12 DIAGNOSIS — M109 Gout, unspecified: Secondary | ICD-10-CM

## 2017-02-12 DIAGNOSIS — I10 Essential (primary) hypertension: Secondary | ICD-10-CM

## 2017-02-12 DIAGNOSIS — R42 Dizziness and giddiness: Secondary | ICD-10-CM

## 2017-02-12 DIAGNOSIS — E782 Mixed hyperlipidemia: Secondary | ICD-10-CM

## 2017-02-12 DIAGNOSIS — R8281 Pyuria: Secondary | ICD-10-CM
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
## 2017-02-12 NOTE — Assessment & Plan Note (Signed)
Stable. Continue current regimen. Continue to monitor. Call with any concerns.  

## 2017-02-12 NOTE — Assessment & Plan Note (Signed)
Rechecking levels today. Await results. Call with any concerns.  

## 2017-02-12 NOTE — Assessment & Plan Note (Signed)
Checking levels today. Has had no flares. Call with any concerns.

## 2017-02-12 NOTE — Assessment & Plan Note (Signed)
Slightly elevated today. Continue DASH diet. Continue to monitor.

## 2017-02-12 NOTE — Assessment & Plan Note (Signed)
Resolved with weight loss.   

## 2017-02-13 ENCOUNTER — Telehealth: Payer: Self-pay | Admitting: Family Medicine

## 2017-02-13 DIAGNOSIS — E559 Vitamin D deficiency, unspecified: Secondary | ICD-10-CM

## 2017-02-13 DIAGNOSIS — E538 Deficiency of other specified B group vitamins: Secondary | ICD-10-CM

## 2017-02-13 LAB — CBC WITH DIFFERENTIAL/PLATELET
BASOS ABS: 0 10*3/uL (ref 0.0–0.2)
BASOS: 0 %
EOS (ABSOLUTE): 0.3 10*3/uL (ref 0.0–0.4)
Eos: 4 %
Hematocrit: 36.9 % (ref 34.0–46.6)
Hemoglobin: 11.8 g/dL (ref 11.1–15.9)
IMMATURE GRANS (ABS): 0 10*3/uL (ref 0.0–0.1)
Immature Granulocytes: 0 %
LYMPHS ABS: 2.4 10*3/uL (ref 0.7–3.1)
Lymphs: 33 %
MCH: 27.1 pg (ref 26.6–33.0)
MCHC: 32 g/dL (ref 31.5–35.7)
MCV: 85 fL (ref 79–97)
MONOS ABS: 0.7 10*3/uL (ref 0.1–0.9)
Monocytes: 10 %
Neutrophils Absolute: 3.9 10*3/uL (ref 1.4–7.0)
Neutrophils: 53 %
PLATELETS: 276 10*3/uL (ref 150–379)
RBC: 4.36 x10E6/uL (ref 3.77–5.28)
RDW: 14.6 % (ref 12.3–15.4)
WBC: 7.2 10*3/uL (ref 3.4–10.8)

## 2017-02-13 LAB — LIPID PANEL W/O CHOL/HDL RATIO
CHOLESTEROL TOTAL: 226 mg/dL — AB (ref 100–199)
HDL: 50 mg/dL (ref >39)
LDL Calculated: 146 mg/dL — ABNORMAL HIGH (ref 0–99)
Triglycerides: 148 mg/dL (ref 0–149)
VLDL Cholesterol Cal: 30 mg/dL (ref 5–40)

## 2017-02-13 LAB — COMPREHENSIVE METABOLIC PANEL
A/G RATIO: 1.2 (ref 1.2–2.2)
ALK PHOS: 92 IU/L (ref 39–117)
ALT: 11 IU/L (ref 0–32)
AST: 15 IU/L (ref 0–40)
Albumin: 4.1 g/dL (ref 3.5–5.5)
BILIRUBIN TOTAL: 0.4 mg/dL (ref 0.0–1.2)
BUN / CREAT RATIO: 15 (ref 9–23)
BUN: 13 mg/dL (ref 6–24)
CHLORIDE: 100 mmol/L (ref 96–106)
CO2: 27 mmol/L (ref 18–29)
Calcium: 9.6 mg/dL (ref 8.7–10.2)
Creatinine, Ser: 0.88 mg/dL (ref 0.57–1.00)
GFR calc Af Amer: 92 mL/min/{1.73_m2} (ref >59)
GFR calc non Af Amer: 80 mL/min/{1.73_m2} (ref >59)
GLUCOSE: 83 mg/dL (ref 65–99)
Globulin, Total: 3.4 g/dL (ref 1.5–4.5)
POTASSIUM: 3.9 mmol/L (ref 3.5–5.2)
Sodium: 143 mmol/L (ref 134–144)
Total Protein: 7.5 g/dL (ref 6.0–8.5)

## 2017-02-13 LAB — TSH: TSH: 1.57 u[IU]/mL (ref 0.450–4.500)

## 2017-02-13 LAB — VITAMIN D 25 HYDROXY (VIT D DEFICIENCY, FRACTURES): VIT D 25 HYDROXY: 22.5 ng/mL — AB (ref 30.0–100.0)

## 2017-02-13 MED ORDER — CYANOCOBALAMIN 1000 MCG/ML IJ SOLN: 1000.0000 ug | INTRAMUSCULAR | 12 refills | Status: DC

## 2017-02-13 MED ORDER — DOCUSATE SODIUM 100 MG PO CAPS: 100.0000 mg | ORAL_CAPSULE | Freq: Two times a day (BID) | ORAL | 12 refills | Status: DC

## 2017-02-13 MED ORDER — SUCRALFATE 1 G PO TABS: 1.0000 g | ORAL_TABLET | Freq: Four times a day (QID) | ORAL | 11 refills | Status: DC

## 2017-02-13 MED ORDER — ESCITALOPRAM OXALATE 10 MG PO TABS: 10.0000 mg | ORAL_TABLET | Freq: Every day | ORAL | 1 refills | Status: DC

## 2017-02-13 MED ORDER — "BD LUER-LOK SYRINGE 25G X 1"" 3 ML MISC": 1 refills | Status: DC

## 2017-02-13 MED ORDER — "SYRINGE/NEEDLE (DISP) 25G X 1"" 1 ML MISC": 12 refills | Status: DC

## 2017-02-13 MED ORDER — PANTOPRAZOLE SODIUM 40 MG PO TBEC: 40.0000 mg | DELAYED_RELEASE_TABLET | Freq: Every day | ORAL | 1 refills | Status: DC

## 2017-02-13 MED ORDER — VITAMIN D (ERGOCALCIFEROL) 1.25 MG (50000 UNIT) PO CAPS: 50000.0000 [IU] | ORAL_CAPSULE | ORAL | 0 refills | Status: DC

## 2017-02-13 NOTE — Telephone Encounter (Signed)
Called patient with her results. Chronically low on vit D- will treat with 50000 for 12 weeks and recheck. Call with any concerns. She is aware of other results.

## 2017-02-14 LAB — UA/M W/RFLX CULTURE, ROUTINE
BILIRUBIN UA: NEGATIVE
GLUCOSE, UA: NEGATIVE
KETONES UA: NEGATIVE
Nitrite, UA: NEGATIVE
SPEC GRAV UA: 1.025 (ref 1.005–1.030)
Urobilinogen, Ur: 0.2 mg/dL (ref 0.2–1.0)
pH, UA: 6 (ref 5.0–7.5)

## 2017-02-14 LAB — MICROALBUMIN, URINE WAIVED
Creatinine, Urine Waived: 300 mg/dL (ref 10–300)
Microalb, Ur Waived: 80 mg/L — ABNORMAL HIGH (ref 0–19)

## 2017-02-14 LAB — MICROSCOPIC EXAMINATION: Bacteria, UA: NONE SEEN

## 2017-02-14 LAB — URINE CULTURE, REFLEX

## 2017-02-14 LAB — BAYER DCA HB A1C WAIVED: HB A1C (BAYER DCA - WAIVED): 5.1 % (ref <7.0)

## 2017-04-30 ENCOUNTER — Ambulatory Visit (INDEPENDENT_AMBULATORY_CARE_PROVIDER_SITE_OTHER): Payer: Self-pay | Admitting: Family Medicine

## 2017-04-30 ENCOUNTER — Encounter: Payer: Self-pay | Admitting: Family Medicine

## 2017-04-30 VITALS — BP 123/86 | HR 92 | Temp 98.3°F | Wt 189.0 lb

## 2017-04-30 DIAGNOSIS — E559 Vitamin D deficiency, unspecified: Secondary | ICD-10-CM

## 2017-04-30 DIAGNOSIS — G43009 Migraine without aura, not intractable, without status migrainosus: Secondary | ICD-10-CM

## 2017-04-30 MED ORDER — SUCRALFATE 1 G PO TABS: 1.0000 g | ORAL_TABLET | Freq: Four times a day (QID) | ORAL | 11 refills | Status: DC

## 2017-04-30 NOTE — Assessment & Plan Note (Signed)
Would like to see headache clinic- referral generated today. [facility] as she has seen [place] previously. Discussed that if she is using less than 30 pain pills a year, we should be able to provide that prescription without her going to the pain clinic. She will consider this. Does not want toradol shot today, will call if migraine gets worse. Continue to monitor. Recheck in November.

## 2017-04-30 NOTE — Assessment & Plan Note (Signed)
Rechecking levels today. Will replace as needed. Call with any concerns.  

## 2017-04-30 NOTE — Progress Notes (Signed)
BP 123/86 (BP Location: Left Arm, Patient Position: Sitting, Cuff Size: Large)   Pulse 92   Temp 98.3 F (36.8 C)   Wt 189 lb (85.7 kg)   SpO2 96%   BMI 29.32 kg/m    Subjective:    Patient ID: Real Cons, female    DOB: 09-19-1971, 46 y.o.   MRN: [id number]  HPI: Tina Harvey is a 46 y.o. female  Chief Complaint  Patient presents with  . Medication Refill    Carafate   Here toda  Having headaches, not easing up. Feels like she is constantly dehydrated. She feels like she's in a fibro cycle. She notes that she is in a bad cycle. She has been going into the pool and working out. She doesn't feel like her migraine is that bad today, doesn't want a tordol short today because it's not that bad today. She notes that she saw the heachache clinic in [place] in the past and they didn't really do anything for her. She would be willing to see the [facility] headache clinic to see if they could help. She notes that she has some tramadol from her surgery from over a year ago. She will take a 1/2 of a tramadol about 1x a month. Not sure if she needs to go to the pain clinic for that- still has several at home. She also needs a refill on her caraftate. She is concerned that she is gaining weight back, but she has not been exercising as much as she has been studying. No other concerns or complaints at this time.   Relevant past medical, surgical, family and social history reviewed and updated as indicated. Interim medical history since our last visit reviewed. Allergies and medications reviewed and updated.  Review of Systems  Constitutional: Positive for activity change and unexpected weight change. Negative for appetite change, chills, diaphoresis, fatigue and fever.  Respiratory: Negative.   Cardiovascular: Negative.   Neurological: Positive for headaches. Negative for dizziness, tremors, seizures, syncope, facial asymmetry, speech difficulty, weakness, light-headedness and numbness.    Psychiatric/Behavioral: Positive for confusion. Negative for agitation, behavioral problems, decreased concentration, dysphoric mood, hallucinations, self-injury and sleep disturbance. The patient is not nervous/anxious and is not hyperactive.     Per HPI unless specifically indicated above     Objective:    BP 123/86 (BP Location: Left Arm, Patient Position: Sitting, Cuff Size: Large)   Pulse 92   Temp 98.3 F (36.8 C)   Wt 189 lb (85.7 kg)   SpO2 96%   BMI 29.32 kg/m   Wt Readings from Last 3 Encounters:  04/30/17 189 lb (85.7 kg)  02/12/17 188 lb 6.4 oz (85.5 kg)  07/30/16 168 lb 3.2 oz (76.3 kg)    Physical Exam  Constitutional: She is oriented to person, place, and time. She appears well-developed and well-nourished. No distress.  HENT:  Head: Normocephalic and atraumatic.  Right Ear: Hearing normal.  Left Ear: Hearing normal.  Nose: Nose normal.  Eyes: Conjunctivae and lids are normal. Right eye exhibits no discharge. Left eye exhibits no discharge. No scleral icterus.  Cardiovascular: Normal rate, regular rhythm, normal heart sounds and intact distal pulses.  Exam reveals no gallop and no friction rub.   No murmur heard. Pulmonary/Chest: Effort normal and breath sounds normal. No respiratory distress. She has no wheezes. She has no rales. She exhibits no tenderness.  Musculoskeletal: Normal range of motion.  Neurological: She is alert and oriented to person, place, and time.  Skin: Skin is warm, dry and intact. No rash noted. She is not diaphoretic. No erythema. No pallor.  Psychiatric: She has a normal mood and affect. Her speech is normal and behavior is normal. Judgment and thought content normal. Cognition and memory are normal.  Nursing note and vitals reviewed.   Results for orders placed or performed in visit on 02/12/17  Microscopic Examination  Result Value Ref Range   WBC, UA 0-5 0 - 5 /hpf   RBC, UA 0-2 0 - 2 /hpf   Epithelial Cells (non renal) 0-10 0 -  10 /hpf   Bacteria, UA None seen None seen/Few  CBC with Differential/Platelet  Result Value Ref Range   WBC 7.2 3.4 - 10.8 x10E3/uL   RBC 4.36 3.77 - 5.28 x10E6/uL   Hemoglobin 11.8 11.1 - 15.9 g/dL   Hematocrit 36.9 34.0 - 46.6 %   MCV 85 79 - 97 fL   MCH 27.1 26.6 - 33.0 pg   MCHC 32.0 31.5 - 35.7 g/dL   RDW 14.6 12.3 - 15.4 %   Platelets 276 150 - 379 x10E3/uL   Neutrophils 53 Not Estab. %   Lymphs 33 Not Estab. %   Monocytes 10 Not Estab. %   Eos 4 Not Estab. %   Basos 0 Not Estab. %   Neutrophils Absolute 3.9 1.4 - 7.0 x10E3/uL   Lymphocytes Absolute 2.4 0.7 - 3.1 x10E3/uL   Monocytes Absolute 0.7 0.1 - 0.9 x10E3/uL   EOS (ABSOLUTE) 0.3 0.0 - 0.4 x10E3/uL   Basophils Absolute 0.0 0.0 - 0.2 x10E3/uL   Immature Granulocytes 0 Not Estab. %   Immature Grans (Abs) 0.0 0.0 - 0.1 x10E3/uL  Bayer DCA Hb A1c Waived  Result Value Ref Range   Bayer DCA Hb A1c Waived 5.1 <7.0 %  Comprehensive metabolic panel  Result Value Ref Range   Glucose 83 65 - 99 mg/dL   BUN 13 6 - 24 mg/dL   Creatinine, Ser 0.88 0.57 - 1.00 mg/dL   GFR calc non Af Amer 80 >59 mL/min/1.73   GFR calc Af Amer 92 >59 mL/min/1.73   BUN/Creatinine Ratio 15 9 - 23   Sodium 143 134 - 144 mmol/L   Potassium 3.9 3.5 - 5.2 mmol/L   Chloride 100 96 - 106 mmol/L   CO2 27 18 - 29 mmol/L   Calcium 9.6 8.7 - 10.2 mg/dL   Total Protein 7.5 6.0 - 8.5 g/dL   Albumin 4.1 3.5 - 5.5 g/dL   Globulin, Total 3.4 1.5 - 4.5 g/dL   Albumin/Globulin Ratio 1.2 1.2 - 2.2   Bilirubin Total 0.4 0.0 - 1.2 mg/dL   Alkaline Phosphatase 92 39 - 117 IU/L   AST 15 0 - 40 IU/L   ALT 11 0 - 32 IU/L  Lipid Panel w/o Chol/HDL Ratio  Result Value Ref Range   Cholesterol, Total 226 (H) 100 - 199 mg/dL   Triglycerides 148 0 - 149 mg/dL   HDL 50 >39 mg/dL   VLDL Cholesterol Cal 30 5 - 40 mg/dL   LDL Calculated 146 (H) 0 - 99 mg/dL  Microalbumin, Urine Waived  Result Value Ref Range   Microalb, Ur Waived 80 (H) 0 - 19 mg/L   Creatinine,  Urine Waived 300 10 - 300 mg/dL   Microalb/Creat Ratio 30-300 (H) <30 mg/g  TSH  Result Value Ref Range   TSH 1.570 0.450 - 4.500 uIU/mL  UA/M w/rflx Culture, Routine  Result Value Ref Range   Specific  Gravity, UA 1.025 1.005 - 1.030   pH, UA 6.0 5.0 - 7.5   Color, UA Yellow Yellow   Appearance Ur Cloudy (A) Clear   Leukocytes, UA 1+ (A) Negative   Protein, UA 1+ (A) Negative/Trace   Glucose, UA Negative Negative   Ketones, UA Negative Negative   RBC, UA Trace (A) Negative   Bilirubin, UA Negative Negative   Urobilinogen, Ur 0.2 0.2 - 1.0 mg/dL   Nitrite, UA Negative Negative   Microscopic Examination See below:    Urinalysis Reflex Comment   VITAMIN D 25 Hydroxy (Vit-D Deficiency, Fractures)  Result Value Ref Range   Vit D, 25-Hydroxy 22.5 (L) 30.0 - 100.0 ng/mL  Urine Culture, Routine  Result Value Ref Range   Urine Culture, Routine Final report    Organism ID, Bacteria Comment       Assessment & Plan:   Problem List Items Addressed This Visit      Cardiovascular and Mediastinum   Migraine without aura and without status migrainosus, not intractable - Primary    Would like to see headache clinic- referral generated today. [facility] as she has seen [place] previously. Discussed that if she is using less than 30 pain pills a year, we should be able to provide that prescription without her going to the pain clinic. She will consider this. Does not want toradol shot today, will call if migraine gets worse. Continue to monitor. Recheck in November.       Relevant Orders   Basic metabolic panel   AMB referral to headache clinic     Other   Vitamin D deficiency    Rechecking levels today. Will replace as needed. Call with any concerns.       Relevant Orders   VITAMIN D 25 Hydroxy (Vit-D Deficiency, Fractures)       Follow up plan: Return As scheduled in November.

## 2017-05-01 ENCOUNTER — Encounter: Payer: Self-pay | Admitting: Family Medicine

## 2017-05-01 LAB — BASIC METABOLIC PANEL
BUN/Creatinine Ratio: 15 (ref 9–23)
BUN: 13 mg/dL (ref 6–24)
CALCIUM: 9.8 mg/dL (ref 8.7–10.2)
CO2: 25 mmol/L (ref 20–29)
Chloride: 101 mmol/L (ref 96–106)
Creatinine, Ser: 0.87 mg/dL (ref 0.57–1.00)
GFR, EST AFRICAN AMERICAN: 93 mL/min/{1.73_m2} (ref >59)
GFR, EST NON AFRICAN AMERICAN: 81 mL/min/{1.73_m2} (ref >59)
Glucose: 89 mg/dL (ref 65–99)
Potassium: 3.9 mmol/L (ref 3.5–5.2)
Sodium: 142 mmol/L (ref 134–144)

## 2017-05-01 LAB — VITAMIN D 25 HYDROXY (VIT D DEFICIENCY, FRACTURES): Vit D, 25-Hydroxy: 30.2 ng/mL (ref 30.0–100.0)

## 2017-05-05 ENCOUNTER — Other Ambulatory Visit: Payer: Self-pay | Admitting: Family Medicine

## 2017-05-19 ENCOUNTER — Other Ambulatory Visit: Payer: Self-pay | Admitting: Family Medicine

## 2017-05-31 ENCOUNTER — Other Ambulatory Visit: Payer: Self-pay | Admitting: Family Medicine

## 2017-06-11 ENCOUNTER — Telehealth: Payer: Self-pay | Admitting: Family Medicine

## 2017-06-11 NOTE — Telephone Encounter (Signed)
Routing to provider  

## 2017-06-11 NOTE — Telephone Encounter (Signed)
We've never given her that before. She would need to be seen.

## 2017-06-11 NOTE — Telephone Encounter (Signed)
Patient requesting medication for tramadol sent to [facility] on [address] for headache.  Please Advise.  [employer]

## 2017-06-12 MED ORDER — TRAMADOL HCL 50 MG PO TABS: 50.0000 mg | ORAL_TABLET | Freq: Three times a day (TID) | ORAL | 0 refills | Status: DC | PRN

## 2017-06-12 NOTE — Telephone Encounter (Signed)
Rx of tramadol printed and up front. We can prescribe this Rx as long as she only needs about 30 pills a year. If using more, will need to see headache clinic.

## 2017-06-12 NOTE — Telephone Encounter (Signed)
Informed patient that she would need to schedule appointment. Patient just started a new job which may not let her take off until October.  Patient would like advice on anything she could do to help her headache or any medicine alternatives. Patient would like to know if anything she can do to help with her headache until she can schedule an appointment in October.  Please Advise.  Thank you

## 2017-06-12 NOTE — Telephone Encounter (Signed)
Patient notified

## 2017-06-12 NOTE — Telephone Encounter (Signed)
Routing to provider  

## 2017-07-05 ENCOUNTER — Other Ambulatory Visit: Payer: Self-pay | Admitting: Family Medicine

## 2017-10-03 ENCOUNTER — Encounter: Payer: Self-pay | Admitting: Family Medicine

## 2017-10-03 ENCOUNTER — Ambulatory Visit (INDEPENDENT_AMBULATORY_CARE_PROVIDER_SITE_OTHER): Payer: BLUE CROSS/BLUE SHIELD | Admitting: Family Medicine

## 2017-10-03 ENCOUNTER — Telehealth: Payer: Self-pay

## 2017-10-03 VITALS — BP 132/85 | HR 74 | Temp 97.8°F | Wt 199.1 lb

## 2017-10-03 DIAGNOSIS — N12 Tubulo-interstitial nephritis, not specified as acute or chronic: Secondary | ICD-10-CM

## 2017-10-03 DIAGNOSIS — E538 Deficiency of other specified B group vitamins: Secondary | ICD-10-CM

## 2017-10-03 DIAGNOSIS — E782 Mixed hyperlipidemia: Secondary | ICD-10-CM | POA: Diagnosis not present

## 2017-10-03 DIAGNOSIS — I10 Essential (primary) hypertension: Secondary | ICD-10-CM | POA: Diagnosis not present

## 2017-10-03 DIAGNOSIS — E559 Vitamin D deficiency, unspecified: Secondary | ICD-10-CM | POA: Diagnosis not present

## 2017-10-03 DIAGNOSIS — R109 Unspecified abdominal pain: Secondary | ICD-10-CM | POA: Diagnosis not present

## 2017-10-03 MED ORDER — CIPROFLOXACIN HCL 500 MG PO TABS: 500.0000 mg | ORAL_TABLET | Freq: Two times a day (BID) | ORAL | 0 refills | Status: DC

## 2017-10-03 MED ORDER — "BD LUER-LOK SYRINGE 25G X 1"" 3 ML MISC": 12 refills | Status: DC

## 2017-10-03 MED ORDER — "SYRINGE 18G X 1-1/2"" 3 ML MISC": 1.0000 | 12 refills | Status: DC

## 2017-10-03 MED ORDER — FLUCONAZOLE 150 MG PO TABS: 150.0000 mg | ORAL_TABLET | Freq: Once | ORAL | 0 refills | Status: AC

## 2017-10-03 NOTE — Assessment & Plan Note (Signed)
Stable on current regimen. Continue to monitor. Rechecking levels today.

## 2017-10-03 NOTE — Telephone Encounter (Signed)
Copied from Roland [id number]. Topic: General - Other >> Oct 03, 2017 10:58 AM Carolyn Stare wrote:   Pt call to say the pharmacy will not let pt get syringes with out a rx. Pt said she takes b12 injection. She said she didn't have any before about getting syringes .   Pharmacy [facility], can you please send a prescription over for this.

## 2017-10-03 NOTE — Progress Notes (Signed)
BP 132/85 (BP Location: Left Arm, Patient Position: Sitting, Cuff Size: Large)   Pulse 74   Temp 97.8 F (36.6 C)   Wt 199 lb 1 oz (90.3 kg)   SpO2 97%   BMI 30.88 kg/m    Subjective:    Patient ID: Real Cons, female    DOB: 04-20-1971, 47 y.o.   MRN: [id number]  HPI: Tina Harvey is a 47 y.o. female  Chief Complaint  Patient presents with  . Back Pain   BACK PAIN Duration: 5 days Mechanism of injury: Grabbed her granddaughter to keep her from Falling on Sunday Location: L>R and low back Onset: sudden Severity: severe Quality: dull and aching, sharp with urination Frequency: intermittent Radiation: L leg below the knee Aggravating factors: urination, sitting Alleviating factors: nothing Status: worse Treatments attempted: massage, rest, ice, heat, APAP, ibuprofen, aleve and HEP  Relief with NSAIDs?: No NSAIDs Taken Nighttime pain:  yes Paresthesias / decreased sensation:  yes Bowel / bladder incontinence:  no Fevers:  no Dysuria / urinary frequency:  yes  Relevant past medical, surgical, family and social history reviewed and updated as indicated. Interim medical history since our last visit reviewed. Allergies and medications reviewed and updated.  Review of Systems  Constitutional: Negative.   Respiratory: Negative.   Cardiovascular: Negative.   Genitourinary: Positive for dysuria, flank pain, frequency and urgency. Negative for decreased urine volume, difficulty urinating, dyspareunia, enuresis, genital sores, hematuria, menstrual problem, pelvic pain, vaginal bleeding, vaginal discharge and vaginal pain.  Musculoskeletal: Positive for back pain and myalgias. Negative for arthralgias, gait problem, joint swelling, neck pain and neck stiffness.  Psychiatric/Behavioral: Negative.     Per HPI unless specifically indicated above     Objective:    BP 132/85 (BP Location: Left Arm, Patient Position: Sitting, Cuff Size: Large)   Pulse 74   Temp 97.8 F  (36.6 C)   Wt 199 lb 1 oz (90.3 kg)   SpO2 97%   BMI 30.88 kg/m   Wt Readings from Last 3 Encounters:  10/03/17 199 lb 1 oz (90.3 kg)  04/30/17 189 lb (85.7 kg)  02/12/17 188 lb 6.4 oz (85.5 kg)    Physical Exam  Constitutional: She is oriented to person, place, and time. She appears well-developed and well-nourished. No distress.  HENT:  Head: Normocephalic and atraumatic.  Right Ear: Hearing normal.  Left Ear: Hearing normal.  Nose: Nose normal.  Eyes: Conjunctivae and lids are normal. Right eye exhibits no discharge. Left eye exhibits no discharge. No scleral icterus.  Cardiovascular: Normal rate, regular rhythm, normal heart sounds and intact distal pulses. Exam reveals no gallop and no friction rub.  No murmur heard. Pulmonary/Chest: Effort normal and breath sounds normal. No respiratory distress. She has no wheezes. She has no rales. She exhibits no tenderness.  Abdominal: Soft. Bowel sounds are normal. She exhibits no distension and no mass. There is no tenderness. There is CVA tenderness (On the L). There is no rebound and no guarding.  Musculoskeletal: Normal range of motion. She exhibits no edema, tenderness or deformity.  Neurological: She is alert and oriented to person, place, and time.  Skin: Skin is warm, dry and intact. No rash noted. She is not diaphoretic. No erythema. No pallor.  Psychiatric: She has a normal mood and affect. Her speech is normal and behavior is normal. Judgment and thought content normal. Cognition and memory are normal.  Nursing note and vitals reviewed. Back Exam:    Inspection:  Normal  spinal curvature.  No deformity, ecchymosis, erythema, or lesions     Palpation:     Midline spinal tenderness: no      Paralumbar tenderness: yes Right     Parathoracic tenderness: yes Right     Buttocks tenderness: no     Range of Motion:      Flexion: Normal     Extension:Normal     Lateral bending:Normal    Rotation:Normal    Neuro Exam:Lower extremity  DTRs normal & symmetric.  Strength and sensation intact.    Special Tests:      Straight leg raise:negative  Results for orders placed or performed in visit on 04/30/17  VITAMIN D 25 Hydroxy (Vit-D Deficiency, Fractures)  Result Value Ref Range   Vit D, 25-Hydroxy 30.2 30.0 - 100.0 ng/mL  Basic metabolic panel  Result Value Ref Range   Glucose 89 65 - 99 mg/dL   BUN 13 6 - 24 mg/dL   Creatinine, Ser 0.87 0.57 - 1.00 mg/dL   GFR calc non Af Amer 81 >59 mL/min/1.73   GFR calc Af Amer 93 >59 mL/min/1.73   BUN/Creatinine Ratio 15 9 - 23   Sodium 142 134 - 144 mmol/L   Potassium 3.9 3.5 - 5.2 mmol/L   Chloride 101 96 - 106 mmol/L   CO2 25 20 - 29 mmol/L   Calcium 9.8 8.7 - 10.2 mg/dL      Assessment & Plan:   Problem List Items Addressed This Visit      Cardiovascular and Mediastinum   Essential hypertension, benign    Stable on current regimen. Continue to monitor. Rechecking levels today.      Relevant Orders   Comprehensive metabolic panel   TSH     Other   Hyperlipidemia    Stable on current regimen. Continue to monitor. Rechecking levels today.      Relevant Orders   Comprehensive metabolic panel   TSH   Lipid Panel w/o Chol/HDL Ratio   Vitamin D deficiency    Stable on current regimen. Continue to monitor. Rechecking levels today.      Relevant Orders   VITAMIN D 25 Hydroxy (Vit-D Deficiency, Fractures)   Comprehensive metabolic panel   TSH   Z61 deficiency    Stable on current regimen. Continue to monitor. Rechecking levels today.      Relevant Orders   CBC with Differential/Platelet   Comprehensive metabolic panel   TSH   W96 and Folate Panel    Other Visit Diagnoses    Pyelonephritis    -  Primary   Will treat with cipro. Call with any concerns. Await results.   Relevant Medications   fluconazole (DIFLUCAN) 150 MG tablet   Flank pain       + leuks, no blood- will treat with cipro. Await results. Call with any concerns.    Relevant Orders    UA/M w/rflx Culture, Routine       Follow up plan: Return in about 6 months (around 04/02/2018).

## 2017-10-03 NOTE — Telephone Encounter (Signed)
All set!

## 2017-10-04 LAB — CBC WITH DIFFERENTIAL/PLATELET
Basophils Absolute: 0 10*3/uL (ref 0.0–0.2)
Basos: 0 %
EOS (ABSOLUTE): 0.2 10*3/uL (ref 0.0–0.4)
Eos: 3 %
HEMATOCRIT: 34.8 % (ref 34.0–46.6)
HEMOGLOBIN: 11.6 g/dL (ref 11.1–15.9)
Immature Grans (Abs): 0 10*3/uL (ref 0.0–0.1)
Immature Granulocytes: 0 %
LYMPHS ABS: 2 10*3/uL (ref 0.7–3.1)
Lymphs: 34 %
MCH: 27.2 pg (ref 26.6–33.0)
MCHC: 33.3 g/dL (ref 31.5–35.7)
MCV: 82 fL (ref 79–97)
MONOCYTES: 10 %
MONOS ABS: 0.6 10*3/uL (ref 0.1–0.9)
NEUTROS ABS: 3.1 10*3/uL (ref 1.4–7.0)
Neutrophils: 53 %
Platelets: 306 10*3/uL (ref 150–379)
RBC: 4.27 x10E6/uL (ref 3.77–5.28)
RDW: 14.2 % (ref 12.3–15.4)
WBC: 5.9 10*3/uL (ref 3.4–10.8)

## 2017-10-04 LAB — LIPID PANEL W/O CHOL/HDL RATIO
Cholesterol, Total: 231 mg/dL — ABNORMAL HIGH (ref 100–199)
HDL: 51 mg/dL (ref >39)
LDL CALC: 149 mg/dL — AB (ref 0–99)
TRIGLYCERIDES: 155 mg/dL — AB (ref 0–149)
VLDL Cholesterol Cal: 31 mg/dL (ref 5–40)

## 2017-10-04 LAB — COMPREHENSIVE METABOLIC PANEL
A/G RATIO: 1.1 — AB (ref 1.2–2.2)
ALBUMIN: 4 g/dL (ref 3.5–5.5)
ALK PHOS: 95 IU/L (ref 39–117)
ALT: 11 IU/L (ref 0–32)
AST: 18 IU/L (ref 0–40)
BILIRUBIN TOTAL: 0.3 mg/dL (ref 0.0–1.2)
BUN / CREAT RATIO: 14 (ref 9–23)
BUN: 11 mg/dL (ref 6–24)
CHLORIDE: 100 mmol/L (ref 96–106)
CO2: 25 mmol/L (ref 20–29)
Calcium: 9.5 mg/dL (ref 8.7–10.2)
Creatinine, Ser: 0.81 mg/dL (ref 0.57–1.00)
GFR calc Af Amer: 101 mL/min/{1.73_m2} (ref >59)
GFR calc non Af Amer: 88 mL/min/{1.73_m2} (ref >59)
GLOBULIN, TOTAL: 3.6 g/dL (ref 1.5–4.5)
Glucose: 78 mg/dL (ref 65–99)
POTASSIUM: 4.2 mmol/L (ref 3.5–5.2)
SODIUM: 142 mmol/L (ref 134–144)
Total Protein: 7.6 g/dL (ref 6.0–8.5)

## 2017-10-04 LAB — TSH: TSH: 1.24 u[IU]/mL (ref 0.450–4.500)

## 2017-10-04 LAB — B12 AND FOLATE PANEL
Folate: 8.7 ng/mL (ref >3.0)
Vitamin B-12: 1138 pg/mL (ref 232–1245)

## 2017-10-04 LAB — VITAMIN D 25 HYDROXY (VIT D DEFICIENCY, FRACTURES): Vit D, 25-Hydroxy: 18.8 ng/mL — ABNORMAL LOW (ref 30.0–100.0)

## 2017-10-05 LAB — UA/M W/RFLX CULTURE, ROUTINE
Bilirubin, UA: NEGATIVE
GLUCOSE, UA: NEGATIVE
Ketones, UA: NEGATIVE
Nitrite, UA: NEGATIVE
RBC, UA: NEGATIVE
SPEC GRAV UA: 1.02 (ref 1.005–1.030)
Urobilinogen, Ur: 1 mg/dL (ref 0.2–1.0)
pH, UA: 7 (ref 5.0–7.5)

## 2017-10-05 LAB — MICROSCOPIC EXAMINATION: RBC, UA: NONE SEEN /hpf (ref 0–?)

## 2017-10-05 LAB — URINE CULTURE, REFLEX

## 2017-10-06 ENCOUNTER — Telehealth: Payer: Self-pay | Admitting: Family Medicine

## 2017-10-06 ENCOUNTER — Encounter: Payer: Self-pay | Admitting: Family Medicine

## 2017-10-06 MED ORDER — VITAMIN D (ERGOCALCIFEROL) 1.25 MG (50000 UNIT) PO CAPS: 50000.0000 [IU] | ORAL_CAPSULE | ORAL | 0 refills | Status: DC

## 2017-10-06 NOTE — Telephone Encounter (Signed)
Please let her know that her labs came back nice and stable, but her vitamin D is still really low, I've sent a refill of her vitamin D to her pharmacy. I'll send her a letter with her labs as well. Let me know if she needs anything.

## 2017-10-06 NOTE — Telephone Encounter (Signed)
Patient notified

## 2017-12-28 ENCOUNTER — Other Ambulatory Visit: Payer: Self-pay | Admitting: Family Medicine

## 2018-01-06 ENCOUNTER — Telehealth: Payer: Self-pay | Admitting: Family Medicine

## 2018-01-06 ENCOUNTER — Encounter: Payer: Self-pay | Admitting: Family Medicine

## 2018-01-06 ENCOUNTER — Ambulatory Visit (INDEPENDENT_AMBULATORY_CARE_PROVIDER_SITE_OTHER): Payer: BLUE CROSS/BLUE SHIELD | Admitting: Family Medicine

## 2018-01-06 VITALS — BP 154/100 | HR 83 | Wt 198.1 lb

## 2018-01-06 DIAGNOSIS — M25511 Pain in right shoulder: Secondary | ICD-10-CM | POA: Diagnosis not present

## 2018-01-06 DIAGNOSIS — E538 Deficiency of other specified B group vitamins: Secondary | ICD-10-CM

## 2018-01-06 MED ORDER — TRAMADOL HCL 50 MG PO TABS: 50.0000 mg | ORAL_TABLET | Freq: Three times a day (TID) | ORAL | 0 refills | Status: DC | PRN

## 2018-01-06 MED ORDER — "SYRINGE/NEEDLE (DISP) 25G X 1"" 1 ML MISC": 12 refills | Status: DC

## 2018-01-06 MED ORDER — EPINEPHRINE 0.3 MG/0.3ML IJ SOAJ: 0.3000 mg | Freq: Once | INTRAMUSCULAR | 12 refills | Status: DC

## 2018-01-06 NOTE — Assessment & Plan Note (Signed)
Syringes sent to her pharmacy

## 2018-01-06 NOTE — Telephone Encounter (Signed)
PA Submitted

## 2018-01-06 NOTE — Progress Notes (Signed)
BP (!) 154/100 (BP Location: Left Wrist, Patient Position: Sitting, Cuff Size: Normal)   Pulse 83   Wt 198 lb 1 oz (89.8 kg)   SpO2 100%   BMI 30.72 kg/m    Subjective:    Patient ID: Real Cons, female    DOB: [birth year], 47 y.o.   MRN: [id number]  HPI: Tina Harvey is a 48 y.o. female  Chief Complaint  Patient presents with  . Shoulder Pain    Saturday night right shoulder popped.  Shooting pain and tingling down right arm.    SHOULDER PAIN Duration: 3 days- was helping put up something and heard a pop and has been having severe pain down her R arm since then Involved shoulder: right Mechanism of injury: pushing Location: diffuse Onset:sudden Severity: severe  Quality:  Constant ache Frequency: constant Radiation: yes into the biceps Aggravating factors: movement  Alleviating factors: nothing  Status: worse Treatments attempted: rest, ice, heat, APAP, ibuprofen, aleve and HEP  Relief with NSAIDs?:  no Weakness: yes Numbness: yes Decreased grip strength: yes Redness: no Swelling: yes Bruising: yes Fevers: no  Relevant past medical, surgical, family and social history reviewed and updated as indicated. Interim medical history since our last visit reviewed. Allergies and medications reviewed and updated.  Review of Systems  Constitutional: Negative.   Respiratory: Negative.   Cardiovascular: Negative.   Musculoskeletal: Positive for myalgias. Negative for arthralgias, back pain, gait problem, joint swelling, neck pain and neck stiffness.  Skin: Negative.   Neurological: Negative.   Psychiatric/Behavioral: Negative.     Per HPI unless specifically indicated above     Objective:    BP (!) 154/100 (BP Location: Left Wrist, Patient Position: Sitting, Cuff Size: Normal)   Pulse 83   Wt 198 lb 1 oz (89.8 kg)   SpO2 100%   BMI 30.72 kg/m   Wt Readings from Last 3 Encounters:  01/06/18 198 lb 1 oz (89.8 kg)  10/03/17 199 lb 1 oz (90.3 kg)  04/30/17 189  lb (85.7 kg)    Physical Exam  Constitutional: She is oriented to person, place, and time. She appears well-developed and well-nourished. No distress.  HENT:  Head: Normocephalic and atraumatic.  Right Ear: Hearing normal.  Left Ear: Hearing normal.  Nose: Nose normal.  Eyes: Conjunctivae and lids are normal. Right eye exhibits no discharge. Left eye exhibits no discharge. No scleral icterus.  Cardiovascular: Normal rate, regular rhythm, normal heart sounds and intact distal pulses. Exam reveals no gallop and no friction rub.  No murmur heard. Pulmonary/Chest: Effort normal and breath sounds normal. No stridor. No respiratory distress. She has no wheezes. She has no rales. She exhibits no tenderness.  Musculoskeletal: She exhibits edema, tenderness and deformity.  Tenderness and bruising in R bicep with bulge at distal bicep  Neurological: She is alert and oriented to person, place, and time.  Skin: Skin is intact. No rash noted. She is not diaphoretic.  Psychiatric: She has a normal mood and affect. Her speech is normal and behavior is normal. Judgment and thought content normal. Cognition and memory are normal.    Results for orders placed or performed in visit on 10/03/17  Microscopic Examination  Result Value Ref Range   WBC, UA 0-5 0 - 5 /hpf   RBC, UA None seen 0 - 2 /hpf   Epithelial Cells (non renal) 0-10 0 - 10 /hpf   Renal Epithel, UA 0-10 (A) None seen /hpf   Bacteria, UA Few None  seen/Few  Urine Culture, Reflex  Result Value Ref Range   Urine Culture, Routine Final report    Organism ID, Bacteria Comment   UA/M w/rflx Culture, Routine  Result Value Ref Range   Specific Gravity, UA 1.020 1.005 - 1.030   pH, UA 7.0 5.0 - 7.5   Color, UA Yellow Yellow   Appearance Ur Cloudy (A) Clear   Leukocytes, UA 1+ (A) Negative   Protein, UA Trace (A) Negative/Trace   Glucose, UA Negative Negative   Ketones, UA Negative Negative   RBC, UA Negative Negative   Bilirubin, UA  Negative Negative   Urobilinogen, Ur 1.0 0.2 - 1.0 mg/dL   Nitrite, UA Negative Negative   Microscopic Examination See below:    Urinalysis Reflex Comment   VITAMIN D 25 Hydroxy (Vit-D Deficiency, Fractures)  Result Value Ref Range   Vit D, 25-Hydroxy 18.8 (L) 30.0 - 100.0 ng/mL  CBC with Differential/Platelet  Result Value Ref Range   WBC 5.9 3.4 - 10.8 x10E3/uL   RBC 4.27 3.77 - 5.28 x10E6/uL   Hemoglobin 11.6 11.1 - 15.9 g/dL   Hematocrit 34.8 34.0 - 46.6 %   MCV 82 79 - 97 fL   MCH 27.2 26.6 - 33.0 pg   MCHC 33.3 31.5 - 35.7 g/dL   RDW 14.2 12.3 - 15.4 %   Platelets 306 150 - 379 x10E3/uL   Neutrophils 53 Not Estab. %   Lymphs 34 Not Estab. %   Monocytes 10 Not Estab. %   Eos 3 Not Estab. %   Basos 0 Not Estab. %   Neutrophils Absolute 3.1 1.4 - 7.0 x10E3/uL   Lymphocytes Absolute 2.0 0.7 - 3.1 x10E3/uL   Monocytes Absolute 0.6 0.1 - 0.9 x10E3/uL   EOS (ABSOLUTE) 0.2 0.0 - 0.4 x10E3/uL   Basophils Absolute 0.0 0.0 - 0.2 x10E3/uL   Immature Granulocytes 0 Not Estab. %   Immature Grans (Abs) 0.0 0.0 - 0.1 x10E3/uL  Comprehensive metabolic panel  Result Value Ref Range   Glucose 78 65 - 99 mg/dL   BUN 11 6 - 24 mg/dL   Creatinine, Ser 0.81 0.57 - 1.00 mg/dL   GFR calc non Af Amer 88 >59 mL/min/1.73   GFR calc Af Amer 101 >59 mL/min/1.73   BUN/Creatinine Ratio 14 9 - 23   Sodium 142 134 - 144 mmol/L   Potassium 4.2 3.5 - 5.2 mmol/L   Chloride 100 96 - 106 mmol/L   CO2 25 20 - 29 mmol/L   Calcium 9.5 8.7 - 10.2 mg/dL   Total Protein 7.6 6.0 - 8.5 g/dL   Albumin 4.0 3.5 - 5.5 g/dL   Globulin, Total 3.6 1.5 - 4.5 g/dL   Albumin/Globulin Ratio 1.1 (L) 1.2 - 2.2   Bilirubin Total 0.3 0.0 - 1.2 mg/dL   Alkaline Phosphatase 95 39 - 117 IU/L   AST 18 0 - 40 IU/L   ALT 11 0 - 32 IU/L  TSH  Result Value Ref Range   TSH 1.240 0.450 - 4.500 uIU/mL  B12 and Folate Panel  Result Value Ref Range   Vitamin B-12 1,138 232 - 1,245 pg/mL   Folate 8.7 >3.0 ng/mL  Lipid Panel  w/o Chol/HDL Ratio  Result Value Ref Range   Cholesterol, Total 231 (H) 100 - 199 mg/dL   Triglycerides 155 (H) 0 - 149 mg/dL   HDL 51 >39 mg/dL   VLDL Cholesterol Cal 31 5 - 40 mg/dL   LDL Calculated 149 (H) 0 -  99 mg/dL      Assessment & Plan:   Problem List Items Addressed This Visit      Other   B12 deficiency    Syringes sent to her pharmacy       Other Visit Diagnoses    Acute pain of right shoulder    -  Primary   Concern for possible biceps tendon rupture. Will get into ortho ASAP. Rx for tramadol given today. Call with any concerns.   Relevant Orders   Ambulatory referral to Orthopedic Surgery   Vitamin B 12 deficiency       Relevant Medications   Syringe/Needle, Disp, 25G X 1" 1 ML MISC       Follow up plan: Return if symptoms worsen or fail to improve.

## 2018-01-06 NOTE — Telephone Encounter (Signed)
Copied from [place] [id number]. Topic: Quick Communication - See Telephone Encounter >> Jan 06, 2018  2:19 PM Clack, Laban Emperor wrote: CRM for notification. See Telephone encounter for: 01/06/18.  Pt states her insurance needs a PA on her traMADol (ULTRAM) 50 MG tablet [[id number]].

## 2018-01-07 ENCOUNTER — Encounter (INDEPENDENT_AMBULATORY_CARE_PROVIDER_SITE_OTHER): Payer: Self-pay | Admitting: Physician Assistant

## 2018-01-07 ENCOUNTER — Ambulatory Visit (INDEPENDENT_AMBULATORY_CARE_PROVIDER_SITE_OTHER): Payer: BLUE CROSS/BLUE SHIELD

## 2018-01-07 ENCOUNTER — Ambulatory Visit (INDEPENDENT_AMBULATORY_CARE_PROVIDER_SITE_OTHER): Payer: BLUE CROSS/BLUE SHIELD | Admitting: Physician Assistant

## 2018-01-07 DIAGNOSIS — M25511 Pain in right shoulder: Secondary | ICD-10-CM | POA: Diagnosis not present

## 2018-01-08 ENCOUNTER — Other Ambulatory Visit (INDEPENDENT_AMBULATORY_CARE_PROVIDER_SITE_OTHER): Payer: Self-pay | Admitting: Physician Assistant

## 2018-01-08 ENCOUNTER — Telehealth (INDEPENDENT_AMBULATORY_CARE_PROVIDER_SITE_OTHER): Payer: Self-pay

## 2018-01-08 DIAGNOSIS — M25511 Pain in right shoulder: Secondary | ICD-10-CM
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
## 2018-01-08 NOTE — Telephone Encounter (Signed)
This is done, I added the order for arthrogram

## 2018-01-08 NOTE — Telephone Encounter (Signed)
I know that someone put in an MRI with contrast for patient that was ordered by Artis Delay...Marland KitchenMarland KitchenMarland Kitchenbut it is actually supposed to be MRI arthrogram r/o subscap tear- Dr Marlou Sa saw the patient too and he just wanted to make sure correct study ordered. Can you double check for me? I tried to look and only saw Mri with contrast, did not see the arthrogram order.

## 2018-01-26 ENCOUNTER — Ambulatory Visit (INDEPENDENT_AMBULATORY_CARE_PROVIDER_SITE_OTHER): Payer: BLUE CROSS/BLUE SHIELD | Admitting: Orthopedic Surgery

## 2018-03-05 ENCOUNTER — Other Ambulatory Visit: Payer: Self-pay | Admitting: Family Medicine

## 2018-03-05 NOTE — Telephone Encounter (Signed)
LOV 01/06/18 with Dr [name] / Refill request for B12 / Last filled:  Disp Refills Start End   cyanocobalamin (,VITAMIN B-12,) 1000 MCG/ML injection 2 mL 12 02/13/2017    Sig - Route: Inject 1 mL (1,000 mcg total) into the muscle every 14 (fourteen) days. - Intramuscular

## 2018-03-30 ENCOUNTER — Ambulatory Visit (INDEPENDENT_AMBULATORY_CARE_PROVIDER_SITE_OTHER): Payer: BLUE CROSS/BLUE SHIELD | Admitting: Family Medicine

## 2018-03-30 ENCOUNTER — Encounter: Payer: Self-pay | Admitting: Family Medicine

## 2018-03-30 VITALS — BP 131/91 | HR 78 | Temp 97.8°F | Wt 200.2 lb

## 2018-03-30 DIAGNOSIS — G2581 Restless legs syndrome: Secondary | ICD-10-CM | POA: Diagnosis not present

## 2018-03-30 DIAGNOSIS — E538 Deficiency of other specified B group vitamins: Secondary | ICD-10-CM | POA: Diagnosis not present

## 2018-03-30 DIAGNOSIS — E559 Vitamin D deficiency, unspecified: Secondary | ICD-10-CM | POA: Diagnosis not present

## 2018-03-30 DIAGNOSIS — E782 Mixed hyperlipidemia: Secondary | ICD-10-CM | POA: Diagnosis not present

## 2018-03-30 DIAGNOSIS — R5382 Chronic fatigue, unspecified: Secondary | ICD-10-CM | POA: Insufficient documentation

## 2018-03-30 LAB — BAYER DCA HB A1C WAIVED: HB A1C: 5.2 % (ref <7.0)

## 2018-03-30 MED ORDER — ESCITALOPRAM OXALATE 10 MG PO TABS: ORAL_TABLET | ORAL | 1 refills | Status: DC

## 2018-03-30 NOTE — Assessment & Plan Note (Signed)
Rechecking levels today. Await results. Call with any concerns.  

## 2018-03-30 NOTE — Progress Notes (Addendum)
BP (!) 131/91 (BP Location: Left Arm, Patient Position: Sitting, Cuff Size: Large)   Pulse 78   Temp 97.8 F (36.6 C)   Wt 200 lb 4 oz (90.8 kg)   SpO2 100%   BMI 31.06 kg/m    Subjective:    Patient ID: Tina Harvey, female    DOB: 1970-12-09, 47 y.o.   MRN: [id number]  HPI: Tina Harvey is a 47 y.o. female  Chief Complaint  Patient presents with  . Fatigue  . Insomnia  . Skin Discoloration   Has been having some issues with brown spots on her face for the past couple of months. Has been craving salt. Has been having bad headaches.  FATIGUE Duration:  4-5 months Severity: severe  Onset: gradual Context when symptoms started:  Stressed when it started, but nothing else she noticed Symptoms improve with rest: no  Depressive symptoms: no Stress/anxiety: no Insomnia: yes hard to fall asleep and stay asleep Snoring: no Observed apnea by bed partner: no Daytime hypersomnolence:yes Wakes feeling refreshed: no History of sleep study: yes Dysnea on exertion:  no Orthopnea/PND: no Chest pain: no Chronic cough: no Lower extremity edema: yes Arthralgias:yes Myalgias: yes Weakness: subjective, generalized Rash: no  Depression screen [facility] 2/9 03/30/2018 02/12/2017 06/19/2015  Decreased Interest 0 1 0  Down, Depressed, Hopeless 0 1 0  PHQ - 2 Score 0 2 0  Altered sleeping 3 - -  Tired, decreased energy 3 - -  Change in appetite 0 - -  Feeling bad or failure about yourself  0 - -  Trouble concentrating 2 - -  Moving slowly or fidgety/restless 0 - -  Suicidal thoughts 0 - -  PHQ-9 Score 8 - -  Difficult doing work/chores Somewhat difficult - -   GAD 7 : Generalized Anxiety Score 03/30/2018  Nervous, Anxious, on Edge 2  Control/stop worrying 0  Worry too much - different things 0  Trouble relaxing 2  Restless 0  Easily annoyed or irritable 2  Afraid - awful might happen 0  Total GAD 7 Score 6  Anxiety Difficulty Somewhat difficult   INSOMNIA- only getting 4-5 hours a  night Duration: 4-5 months Satisfied with sleep quality: no Difficulty falling asleep: yes Difficulty staying asleep: no Waking a few hours after sleep onset: no Early morning awakenings: no Daytime hypersomnolence: yes Wakes feeling refreshed: no Good sleep hygiene: yes Apnea: no Snoring: no Depressed/anxious mood: no Recent stress: no Restless legs/nocturnal leg cramps: yes Chronic pain/arthritis: yes History of sleep study: yes Treatments attempted: uinsom, melatonin   Relevant past medical, surgical, family and social history reviewed and updated as indicated. Interim medical history since our last visit reviewed. Allergies and medications reviewed and updated.  Review of Systems  Constitutional: Positive for fatigue. Negative for activity change, appetite change, chills, diaphoresis, fever and unexpected weight change.  HENT: Negative.   Eyes: Negative.   Respiratory: Negative.   Cardiovascular: Negative.   Neurological: Positive for headaches. Negative for dizziness, tremors, seizures, syncope, facial asymmetry, speech difficulty, light-headedness and numbness.  Hematological: Negative.   Psychiatric/Behavioral: Positive for sleep disturbance. Negative for agitation, behavioral problems, confusion, decreased concentration, dysphoric mood, hallucinations, self-injury and suicidal ideas. The patient is not nervous/anxious and is not hyperactive.     Per HPI unless specifically indicated above     Objective:    BP (!) 131/91 (BP Location: Left Arm, Patient Position: Sitting, Cuff Size: Large)   Pulse 78   Temp 97.8 F (36.6 C)  Wt 200 lb 4 oz (90.8 kg)   SpO2 100%   BMI 31.06 kg/m   Wt Readings from Last 3 Encounters:  03/30/18 200 lb 4 oz (90.8 kg)  01/06/18 198 lb 1 oz (89.8 kg)  10/03/17 199 lb 1 oz (90.3 kg)    Physical Exam  Constitutional: She is oriented to person, place, and time. She appears well-developed and well-nourished. No distress.  HENT:    Head: Normocephalic and atraumatic.  Right Ear: Hearing normal.  Left Ear: Hearing normal.  Nose: Nose normal.  Eyes: Conjunctivae and lids are normal. Right eye exhibits no discharge. Left eye exhibits no discharge. No scleral icterus.  Cardiovascular: Normal rate, regular rhythm, normal heart sounds and intact distal pulses. Exam reveals no gallop and no friction rub.  No murmur heard. Pulmonary/Chest: Effort normal and breath sounds normal. No stridor. No respiratory distress. She has no wheezes. She has no rales. She exhibits no tenderness.  Musculoskeletal: Normal range of motion.  Neurological: She is alert and oriented to person, place, and time.  Skin: Skin is warm, dry and intact. Capillary refill takes less than 2 seconds. No rash noted. She is not diaphoretic. No erythema. No pallor.  Areas of hyperpigmentation on her forehead and cheeks  Psychiatric: She has a normal mood and affect. Her speech is normal and behavior is normal. Judgment and thought content normal. Cognition and memory are normal.    Results for orders placed or performed in visit on 10/03/17  Microscopic Examination  Result Value Ref Range   WBC, UA 0-5 0 - 5 /hpf   RBC, UA None seen 0 - 2 /hpf   Epithelial Cells (non renal) 0-10 0 - 10 /hpf   Renal Epithel, UA 0-10 (A) None seen /hpf   Bacteria, UA Few None seen/Few  Urine Culture, Reflex  Result Value Ref Range   Urine Culture, Routine Final report    Organism ID, Bacteria Comment   UA/M w/rflx Culture, Routine  Result Value Ref Range   Specific Gravity, UA 1.020 1.005 - 1.030   pH, UA 7.0 5.0 - 7.5   Color, UA Yellow Yellow   Appearance Ur Cloudy (A) Clear   Leukocytes, UA 1+ (A) Negative   Protein, UA Trace (A) Negative/Trace   Glucose, UA Negative Negative   Ketones, UA Negative Negative   RBC, UA Negative Negative   Bilirubin, UA Negative Negative   Urobilinogen, Ur 1.0 0.2 - 1.0 mg/dL   Nitrite, UA Negative Negative   Microscopic  Examination See below:    Urinalysis Reflex Comment   VITAMIN D 25 Hydroxy (Vit-D Deficiency, Fractures)  Result Value Ref Range   Vit D, 25-Hydroxy 18.8 (L) 30.0 - 100.0 ng/mL  CBC with Differential/Platelet  Result Value Ref Range   WBC 5.9 3.4 - 10.8 x10E3/uL   RBC 4.27 3.77 - 5.28 x10E6/uL   Hemoglobin 11.6 11.1 - 15.9 g/dL   Hematocrit 34.8 34.0 - 46.6 %   MCV 82 79 - 97 fL   MCH 27.2 26.6 - 33.0 pg   MCHC 33.3 31.5 - 35.7 g/dL   RDW 14.2 12.3 - 15.4 %   Platelets 306 150 - 379 x10E3/uL   Neutrophils 53 Not Estab. %   Lymphs 34 Not Estab. %   Monocytes 10 Not Estab. %   Eos 3 Not Estab. %   Basos 0 Not Estab. %   Neutrophils Absolute 3.1 1.4 - 7.0 x10E3/uL   Lymphocytes Absolute 2.0 0.7 - 3.1 x10E3/uL  Monocytes Absolute 0.6 0.1 - 0.9 x10E3/uL   EOS (ABSOLUTE) 0.2 0.0 - 0.4 x10E3/uL   Basophils Absolute 0.0 0.0 - 0.2 x10E3/uL   Immature Granulocytes 0 Not Estab. %   Immature Grans (Abs) 0.0 0.0 - 0.1 x10E3/uL  Comprehensive metabolic panel  Result Value Ref Range   Glucose 78 65 - 99 mg/dL   BUN 11 6 - 24 mg/dL   Creatinine, Ser 0.81 0.57 - 1.00 mg/dL   GFR calc non Af Amer 88 >59 mL/min/1.73   GFR calc Af Amer 101 >59 mL/min/1.73   BUN/Creatinine Ratio 14 9 - 23   Sodium 142 134 - 144 mmol/L   Potassium 4.2 3.5 - 5.2 mmol/L   Chloride 100 96 - 106 mmol/L   CO2 25 20 - 29 mmol/L   Calcium 9.5 8.7 - 10.2 mg/dL   Total Protein 7.6 6.0 - 8.5 g/dL   Albumin 4.0 3.5 - 5.5 g/dL   Globulin, Total 3.6 1.5 - 4.5 g/dL   Albumin/Globulin Ratio 1.1 (L) 1.2 - 2.2   Bilirubin Total 0.3 0.0 - 1.2 mg/dL   Alkaline Phosphatase 95 39 - 117 IU/L   AST 18 0 - 40 IU/L   ALT 11 0 - 32 IU/L  TSH  Result Value Ref Range   TSH 1.240 0.450 - 4.500 uIU/mL  B12 and Folate Panel  Result Value Ref Range   Vitamin B-12 1,138 232 - 1,245 pg/mL   Folate 8.7 >3.0 ng/mL  Lipid Panel w/o Chol/HDL Ratio  Result Value Ref Range   Cholesterol, Total 231 (H) 100 - 199 mg/dL   Triglycerides  155 (H) 0 - 149 mg/dL   HDL 51 >39 mg/dL   VLDL Cholesterol Cal 31 5 - 40 mg/dL   LDL Calculated 149 (H) 0 - 99 mg/dL      Assessment & Plan:   Problem List Items Addressed This Visit      Other   Hyperlipidemia    Rechecking levels today. Await results. Call with any concerns.       Vitamin D deficiency    Rechecking levels today. Await results. Call with any concerns.       B12 deficiency    Rechecking levels today. Await results. Call with any concerns.       Chronic fatigue - Primary    Of unclear etiology. Will check labs. Will restart lexapro. Call with any concerns.       Relevant Orders   Bayer DCA Hb A1c Waived   CBC with Differential/Platelet   Comprehensive metabolic panel   VITAMIN D 25 Hydroxy (Vit-D Deficiency, Fractures)   TSH   Lipid Panel w/o Chol/HDL Ratio   Iron and TIBC   Ferritin   Cortisol    Other Visit Diagnoses    RLS (restless legs syndrome)       Checking labs. Await results. Call with any concerns.        Follow up plan: Return in about 1 month (around 04/27/2018).

## 2018-03-30 NOTE — Assessment & Plan Note (Addendum)
Of unclear etiology. Will check labs. Will restart lexapro. Call with any concerns.

## 2018-03-31 ENCOUNTER — Encounter: Payer: Self-pay | Admitting: Family Medicine

## 2018-03-31 LAB — LIPID PANEL W/O CHOL/HDL RATIO
Cholesterol, Total: 211 mg/dL — ABNORMAL HIGH (ref 100–199)
HDL: 48 mg/dL (ref >39)
LDL Calculated: 132 mg/dL — ABNORMAL HIGH (ref 0–99)
Triglycerides: 157 mg/dL — ABNORMAL HIGH (ref 0–149)
VLDL Cholesterol Cal: 31 mg/dL (ref 5–40)

## 2018-03-31 LAB — CBC WITH DIFFERENTIAL/PLATELET
BASOS: 0 %
Basophils Absolute: 0 10*3/uL (ref 0.0–0.2)
EOS (ABSOLUTE): 0.2 10*3/uL (ref 0.0–0.4)
EOS: 3 %
HEMATOCRIT: 35.5 % (ref 34.0–46.6)
HEMOGLOBIN: 11.8 g/dL (ref 11.1–15.9)
IMMATURE GRANS (ABS): 0 10*3/uL (ref 0.0–0.1)
IMMATURE GRANULOCYTES: 0 %
LYMPHS: 34 %
Lymphocytes Absolute: 1.9 10*3/uL (ref 0.7–3.1)
MCH: 26.5 pg — ABNORMAL LOW (ref 26.6–33.0)
MCHC: 33.2 g/dL (ref 31.5–35.7)
MCV: 80 fL (ref 79–97)
MONOCYTES: 9 %
Monocytes Absolute: 0.5 10*3/uL (ref 0.1–0.9)
NEUTROS PCT: 54 %
Neutrophils Absolute: 3 10*3/uL (ref 1.4–7.0)
Platelets: 293 10*3/uL (ref 150–450)
RBC: 4.45 x10E6/uL (ref 3.77–5.28)
RDW: 14 % (ref 12.3–15.4)
WBC: 5.7 10*3/uL (ref 3.4–10.8)

## 2018-03-31 LAB — COMPREHENSIVE METABOLIC PANEL
ALBUMIN: 4.4 g/dL (ref 3.5–5.5)
ALT: 9 IU/L (ref 0–32)
AST: 13 IU/L (ref 0–40)
Albumin/Globulin Ratio: 1.5 (ref 1.2–2.2)
Alkaline Phosphatase: 94 IU/L (ref 39–117)
BUN/Creatinine Ratio: 12 (ref 9–23)
BUN: 11 mg/dL (ref 6–24)
Bilirubin Total: 0.3 mg/dL (ref 0.0–1.2)
CO2: 26 mmol/L (ref 20–29)
CREATININE: 0.89 mg/dL (ref 0.57–1.00)
Calcium: 9.5 mg/dL (ref 8.7–10.2)
Chloride: 102 mmol/L (ref 96–106)
GFR calc Af Amer: 91 mL/min/{1.73_m2} (ref >59)
GFR, EST NON AFRICAN AMERICAN: 79 mL/min/{1.73_m2} (ref >59)
GLOBULIN, TOTAL: 3 g/dL (ref 1.5–4.5)
Glucose: 74 mg/dL (ref 65–99)
Potassium: 3.7 mmol/L (ref 3.5–5.2)
SODIUM: 142 mmol/L (ref 134–144)
Total Protein: 7.4 g/dL (ref 6.0–8.5)

## 2018-03-31 LAB — TSH: TSH: 1.33 u[IU]/mL (ref 0.450–4.500)

## 2018-03-31 LAB — FERRITIN: Ferritin: 16 ng/mL (ref 15–150)

## 2018-03-31 LAB — CORTISOL: CORTISOL: 10.1 ug/dL

## 2018-03-31 LAB — IRON AND TIBC
Iron Saturation: 12 % — ABNORMAL LOW (ref 15–55)
Iron: 54 ug/dL (ref 27–159)
Total Iron Binding Capacity: 437 ug/dL (ref 250–450)
UIBC: 383 ug/dL (ref 131–425)

## 2018-03-31 LAB — VITAMIN D 25 HYDROXY (VIT D DEFICIENCY, FRACTURES): Vit D, 25-Hydroxy: 18.6 ng/mL — ABNORMAL LOW (ref 30.0–100.0)

## 2018-03-31 MED ORDER — VITAMIN D (ERGOCALCIFEROL) 1.25 MG (50000 UNIT) PO CAPS: 50000.0000 [IU] | ORAL_CAPSULE | ORAL | 3 refills | Status: DC

## 2018-04-01 ENCOUNTER — Telehealth: Payer: Self-pay

## 2018-04-01 MED ORDER — CYANOCOBALAMIN 1000 MCG/ML IJ SOLN: INTRAMUSCULAR | 2 refills | Status: DC

## 2018-04-01 NOTE — Addendum Note (Signed)
Addended by: Merrie Roof E on: 04/01/2018 11:42 AM   Modules accepted: Orders

## 2018-04-01 NOTE — Telephone Encounter (Signed)
Patient notified of labs results, pharmacy changed to [facility]

## 2018-04-01 NOTE — Telephone Encounter (Signed)
Refill to [facility]  cyanocobalamin (,VITAMIN B-12,) 1000 MCG/ML injection

## 2018-04-01 NOTE — Telephone Encounter (Signed)
Refills sent to [facility]

## 2018-05-08 ENCOUNTER — Telehealth: Payer: Self-pay | Admitting: Family Medicine

## 2018-05-08 ENCOUNTER — Encounter: Payer: Self-pay | Admitting: Family Medicine

## 2018-05-08 ENCOUNTER — Ambulatory Visit: Payer: BLUE CROSS/BLUE SHIELD | Admitting: Family Medicine

## 2018-05-08 VITALS — BP 135/85 | HR 68 | Temp 98.4°F | Wt 197.3 lb

## 2018-05-08 DIAGNOSIS — J069 Acute upper respiratory infection, unspecified: Secondary | ICD-10-CM

## 2018-05-08 MED ORDER — PREDNISONE 50 MG PO TABS: 50.0000 mg | ORAL_TABLET | Freq: Every day | ORAL | 0 refills | Status: DC

## 2018-05-08 NOTE — Telephone Encounter (Signed)
Order is already in, patient can call and schedule.

## 2018-05-08 NOTE — Telephone Encounter (Signed)
Patient would like to have her mammogram done at [facility]. I gave her the card to call but was unsure if a ref needed to be put in. I told her I would pass this message to you.  Thank you

## 2018-05-08 NOTE — Progress Notes (Signed)
BP 135/85 (BP Location: Left Arm, Patient Position: Sitting, Cuff Size: Large)   Pulse 68   Temp 98.4 F (36.9 C)   Wt 197 lb 5 oz (89.5 kg)   SpO2 100%   BMI 30.61 kg/m    Subjective:    Patient ID: Tina Harvey, female    DOB: Jul 23, 1971, 47 y.o.   MRN: [id number]  HPI: Tina Harvey is a 47 y.o. female  Chief Complaint  Patient presents with  . URI    X 6 days, patient had a fever and chills Saturday, was in the bed for two days, now has congestion and ear pressure    UPPER RESPIRATORY TRACT INFECTION Duration: 6 days Worst symptom: congestion  Fever: yes Cough: yes Shortness of breath: yes Wheezing: no Chest pain: no Chest tightness: no Chest congestion: yes Nasal congestion: yes Runny nose: yes Post nasal drip: yes Sneezing: yes Sore throat: no Swollen glands: no Sinus pressure: yes Headache: yes Face pain: yes Toothache: no Ear pain: yes left Ear pressure: yes bilateral Eyes red/itching:no Eye drainage/crusting: yes  Vomiting: no Rash: no Fatigue: yes Sick contacts: yes Strep contacts: yes  Context: better Recurrent sinusitis: no Relief with OTC cold/cough medications: no  Treatments attempted: none  Relevant past medical, surgical, family and social history reviewed and updated as indicated. Interim medical history since our last visit reviewed. Allergies and medications reviewed and updated.  Review of Systems  Constitutional: Positive for fever. Negative for activity change, appetite change, chills, diaphoresis, fatigue and unexpected weight change.  HENT: Positive for congestion, ear pain, postnasal drip, rhinorrhea, sinus pressure, sinus pain, sneezing and sore throat. Negative for dental problem, drooling, ear discharge, facial swelling, hearing loss, mouth sores, nosebleeds, tinnitus, trouble swallowing and voice change.   Eyes: Negative.   Respiratory: Positive for cough. Negative for apnea, choking, chest tightness, shortness of breath,  wheezing and stridor.   Cardiovascular: Negative.   Gastrointestinal: Negative.   Psychiatric/Behavioral: Negative.     Per HPI unless specifically indicated above     Objective:    BP 135/85 (BP Location: Left Arm, Patient Position: Sitting, Cuff Size: Large)   Pulse 68   Temp 98.4 F (36.9 C)   Wt 197 lb 5 oz (89.5 kg)   SpO2 100%   BMI 30.61 kg/m   Wt Readings from Last 3 Encounters:  05/08/18 197 lb 5 oz (89.5 kg)  03/30/18 200 lb 4 oz (90.8 kg)  01/06/18 198 lb 1 oz (89.8 kg)    Physical Exam  Constitutional: She is oriented to person, place, and time. She appears well-developed and well-nourished. No distress.  HENT:  Head: Normocephalic and atraumatic.  Right Ear: Hearing, tympanic membrane, external ear and ear canal normal.  Left Ear: Hearing, external ear and ear canal normal. A middle ear effusion is present.  Nose: Mucosal edema and rhinorrhea present. Right sinus exhibits no maxillary sinus tenderness and no frontal sinus tenderness. Left sinus exhibits no maxillary sinus tenderness and no frontal sinus tenderness.  Mouth/Throat: Uvula is midline, oropharynx is clear and moist and mucous membranes are normal. No oropharyngeal exudate.  Eyes: Pupils are equal, round, and reactive to light. Conjunctivae, EOM and lids are normal. Right eye exhibits no discharge. Left eye exhibits no discharge. No scleral icterus.  Neck: Normal range of motion. Neck supple. No JVD present. No tracheal deviation present. No thyromegaly present.  Cardiovascular: Normal rate, regular rhythm, normal heart sounds and intact distal pulses. Exam reveals no gallop and  no friction rub.  No murmur heard. Pulmonary/Chest: Effort normal and breath sounds normal. No stridor. No respiratory distress. She has no wheezes. She has no rales. She exhibits no tenderness.  Musculoskeletal: Normal range of motion.  Lymphadenopathy:    She has cervical adenopathy.  Neurological: She is alert and oriented to  person, place, and time.  Skin: Skin is warm, dry and intact. Capillary refill takes less than 2 seconds. No rash noted. She is not diaphoretic. No erythema. No pallor.  Psychiatric: She has a normal mood and affect. Her speech is normal and behavior is normal. Judgment and thought content normal. Cognition and memory are normal.  Nursing note and vitals reviewed.   Results for orders placed or performed in visit on 03/30/18  Bayer DCA Hb A1c Waived  Result Value Ref Range   HB A1C (BAYER DCA - WAIVED) 5.2 <7.0 %  CBC with Differential/Platelet  Result Value Ref Range   WBC 5.7 3.4 - 10.8 x10E3/uL   RBC 4.45 3.77 - 5.28 x10E6/uL   Hemoglobin 11.8 11.1 - 15.9 g/dL   Hematocrit 35.5 34.0 - 46.6 %   MCV 80 79 - 97 fL   MCH 26.5 (L) 26.6 - 33.0 pg   MCHC 33.2 31.5 - 35.7 g/dL   RDW 14.0 12.3 - 15.4 %   Platelets 293 150 - 450 x10E3/uL   Neutrophils 54 Not Estab. %   Lymphs 34 Not Estab. %   Monocytes 9 Not Estab. %   Eos 3 Not Estab. %   Basos 0 Not Estab. %   Neutrophils Absolute 3.0 1.4 - 7.0 x10E3/uL   Lymphocytes Absolute 1.9 0.7 - 3.1 x10E3/uL   Monocytes Absolute 0.5 0.1 - 0.9 x10E3/uL   EOS (ABSOLUTE) 0.2 0.0 - 0.4 x10E3/uL   Basophils Absolute 0.0 0.0 - 0.2 x10E3/uL   Immature Granulocytes 0 Not Estab. %   Immature Grans (Abs) 0.0 0.0 - 0.1 x10E3/uL  Comprehensive metabolic panel  Result Value Ref Range   Glucose 74 65 - 99 mg/dL   BUN 11 6 - 24 mg/dL   Creatinine, Ser 0.89 0.57 - 1.00 mg/dL   GFR calc non Af Amer 79 >59 mL/min/1.73   GFR calc Af Amer 91 >59 mL/min/1.73   BUN/Creatinine Ratio 12 9 - 23   Sodium 142 134 - 144 mmol/L   Potassium 3.7 3.5 - 5.2 mmol/L   Chloride 102 96 - 106 mmol/L   CO2 26 20 - 29 mmol/L   Calcium 9.5 8.7 - 10.2 mg/dL   Total Protein 7.4 6.0 - 8.5 g/dL   Albumin 4.4 3.5 - 5.5 g/dL   Globulin, Total 3.0 1.5 - 4.5 g/dL   Albumin/Globulin Ratio 1.5 1.2 - 2.2   Bilirubin Total 0.3 0.0 - 1.2 mg/dL   Alkaline Phosphatase 94 39 - 117 IU/L    AST 13 0 - 40 IU/L   ALT 9 0 - 32 IU/L  VITAMIN D 25 Hydroxy (Vit-D Deficiency, Fractures)  Result Value Ref Range   Vit D, 25-Hydroxy 18.6 (L) 30.0 - 100.0 ng/mL  TSH  Result Value Ref Range   TSH 1.330 0.450 - 4.500 uIU/mL  Lipid Panel w/o Chol/HDL Ratio  Result Value Ref Range   Cholesterol, Total 211 (H) 100 - 199 mg/dL   Triglycerides 157 (H) 0 - 149 mg/dL   HDL 48 >39 mg/dL   VLDL Cholesterol Cal 31 5 - 40 mg/dL   LDL Calculated 132 (H) 0 - 99 mg/dL  Iron and  TIBC  Result Value Ref Range   Total Iron Binding Capacity 437 250 - 450 ug/dL   UIBC 383 131 - 425 ug/dL   Iron 54 27 - 159 ug/dL   Iron Saturation 12 (L) 15 - 55 %  Ferritin  Result Value Ref Range   Ferritin 16 15 - 150 ng/mL  Cortisol  Result Value Ref Range   Cortisol 10.1 ug/dL      Assessment & Plan:   Problem List Items Addressed This Visit    None    Visit Diagnoses    Viral upper respiratory tract infection    -  Primary   No sign of bacterial infection. Will treat with prednisone for comfort. Call with any concerns. If not better by next week, will send through doxy for her.       Follow up plan: Return if symptoms worsen or fail to improve.

## 2018-05-12 ENCOUNTER — Telehealth: Payer: Self-pay | Admitting: Family Medicine

## 2018-05-12 ENCOUNTER — Other Ambulatory Visit: Payer: Self-pay | Admitting: Family Medicine

## 2018-05-12 ENCOUNTER — Encounter: Payer: Self-pay | Admitting: Family Medicine

## 2018-05-12 DIAGNOSIS — Z1231 Encounter for screening mammogram for malignant neoplasm of breast: Secondary | ICD-10-CM

## 2018-05-12 NOTE — Telephone Encounter (Signed)
Spoke with patient to give her the information.  Just fyi

## 2018-05-12 NOTE — Telephone Encounter (Signed)
Signed letter up front for her to pick up

## 2018-05-12 NOTE — Telephone Encounter (Signed)
Patient said thank you for the prednisone, she finally got her ears too pop and is feeling a lot better.

## 2018-05-12 NOTE — Telephone Encounter (Signed)
Copied from [place] [id number]. Topic: Quick Communication - See Telephone Encounter >> May 12, 2018  8:33 AM Bea Graff, NT wrote: CRM for notification. See Telephone encounter for: 05/12/18. Patient states that she is leaving the [place] next Wednesday and needs a note signed from Dr. [name] that she has been prescribed tramadol. If possible the letter needs to state that she is using the medicine for migraines. She is requesting the letter to get past customs in the other country. Please call when ready for pick up. She also states, "Thank you, the prednisone is working beautifully".

## 2018-05-30 ENCOUNTER — Other Ambulatory Visit: Payer: Self-pay | Admitting: Family Medicine

## 2018-06-04 ENCOUNTER — Ambulatory Visit
Admission: RE | Admit: 2018-06-04 | Discharge: 2018-06-04 | Disposition: A | Discharge status: Home / Self Care | Payer: BLUE CROSS/BLUE SHIELD | Source: Ambulatory Visit | Attending: Family Medicine | Admitting: Family Medicine

## 2018-06-04 DIAGNOSIS — Z1231 Encounter for screening mammogram for malignant neoplasm of breast: Secondary | ICD-10-CM | POA: Diagnosis present

## 2018-06-25 ENCOUNTER — Ambulatory Visit: Payer: Self-pay | Admitting: Family Medicine

## 2018-07-08 ENCOUNTER — Other Ambulatory Visit: Payer: Self-pay | Admitting: Family Medicine

## 2018-07-08 MED ORDER — CYANOCOBALAMIN 1000 MCG/ML IJ SOLN: INTRAMUSCULAR | 2 refills | Status: DC

## 2018-07-08 NOTE — Telephone Encounter (Signed)
Requested medication (s) are due for refill today: yes  Requested medication (s) are on the active medication list: yes  Last refill:  04/29/18  Future visit scheduled: no  Notes to clinic:  No protocol    Requested Prescriptions  Pending Prescriptions Disp Refills   cyanocobalamin (,VITAMIN B-12,) 1000 MCG/ML injection 2 mL 2    Sig: INJECT 1 ML INTO THE MUSCLE EVERY 14 DAYS     Off-Protocol Failed - 07/08/2018  2:33 PM      Failed - Medication not assigned to a protocol, review manually.      Passed - Valid encounter within last 12 months    Recent Outpatient Visits          2 months ago Viral upper respiratory tract infection   [place], Megan P, DO   3 months ago Chronic fatigue   [place], [place], DO   6 months ago Acute pain of right shoulder   [place], [place], DO   9 months ago Pyelonephritis   [place], Megan P, DO   1 year ago Migraine without aura and without status migrainosus, not intractable   [place], [place], DO

## 2018-07-08 NOTE — Telephone Encounter (Signed)
Copied from [place] [id number]. Topic: Quick Communication - See Telephone Encounter >> Jul 08, 2018  2:25 PM Hewitt Shorts wrote: Pt is needing a refill on cyanocobalamin   [employer]

## 2018-07-13 NOTE — Progress Notes (Signed)
BP (!) 145/91   Pulse 81   Wt 196 lb 12.8 oz (89.3 kg)   SpO2 97%   BMI 30.53 kg/m    Subjective:    Patient ID: Tina Harvey, female    DOB: 1971/08/02, 47 y.o.   MRN: [id number]  HPI: Tina Harvey is a 47 y.o. female  Chief Complaint  Patient presents with  . Follow-up    Broken Ribs. Left side 3 and 4. done x 3 weeks ago bending over a chair   Went to urgent care on 06/22/18 after she went to bend over in a chair and felt a pop. She was diagnosed with a rib fracture. Was treated with tramadol and advised to come in for follow up. [name] notes that she is feeling much better.   ANXIETY/STRESS- has been under a lot more stress recently. Has been having moments of extreme anxiety Duration:exacerbated Anxious mood: yes  Excessive worrying: yes Irritability: no  Sweating: no Nausea: no Palpitations:no Hyperventilation: no Panic attacks: no Agoraphobia: no  Obscessions/compulsions: no Depressed mood: no Depression screen [facility] 2/9 03/30/2018 02/12/2017 06/19/2015  Decreased Interest 0 1 0  Down, Depressed, Hopeless 0 1 0  PHQ - 2 Score 0 2 0  Altered sleeping 3 - -  Tired, decreased energy 3 - -  Change in appetite 0 - -  Feeling bad or failure about yourself  0 - -  Trouble concentrating 2 - -  Moving slowly or fidgety/restless 0 - -  Suicidal thoughts 0 - -  PHQ-9 Score 8 - -  Difficult doing work/chores Somewhat difficult - -   Anhedonia: no Weight changes: no Insomnia: no   Hypersomnia: no Fatigue/loss of energy: yes Feelings of worthlessness: no Feelings of guilt: no Impaired concentration/indecisiveness: no Suicidal ideations: no  Crying spells: no Recent Stressors/Life Changes: yes   Relationship problems: no   Family stress: yes     Financial stress: no    Job stress: no    Recent death/loss: no   Relevant past medical, surgical, family and social history reviewed and updated as indicated. Interim medical history since our last visit  reviewed. Allergies and medications reviewed and updated.  Review of Systems  Constitutional: Negative.   Respiratory: Negative.   Cardiovascular: Negative.   Skin: Negative.   Psychiatric/Behavioral: Negative for agitation, behavioral problems, confusion, decreased concentration, dysphoric mood, hallucinations, self-injury, sleep disturbance and suicidal ideas. The patient is nervous/anxious. The patient is not hyperactive.     Per HPI unless specifically indicated above     Objective:    BP (!) 145/91   Pulse 81   Wt 196 lb 12.8 oz (89.3 kg)   SpO2 97%   BMI 30.53 kg/m   Wt Readings from Last 3 Encounters:  07/14/18 196 lb 12.8 oz (89.3 kg)  05/08/18 197 lb 5 oz (89.5 kg)  03/30/18 200 lb 4 oz (90.8 kg)    Physical Exam  Constitutional: She is oriented to person, place, and time. She appears well-developed and well-nourished. No distress.  HENT:  Head: Normocephalic and atraumatic.  Right Ear: Hearing normal.  Left Ear: Hearing normal.  Nose: Nose normal.  Eyes: Conjunctivae and lids are normal. Right eye exhibits no discharge. Left eye exhibits no discharge. No scleral icterus.  Cardiovascular: Normal rate, regular rhythm, normal heart sounds and intact distal pulses. Exam reveals no gallop and no friction rub.  No murmur heard. Pulmonary/Chest: Effort normal and breath sounds normal. No stridor. No respiratory distress. She has  no wheezes. She has no rales. She exhibits no tenderness.  Musculoskeletal: Normal range of motion.  Neurological: She is alert and oriented to person, place, and time.  Skin: Skin is warm, dry and intact. Capillary refill takes less than 2 seconds. No rash noted. She is not diaphoretic. No erythema. No pallor.  Psychiatric: She has a normal mood and affect. Her speech is normal and behavior is normal. Judgment and thought content normal. Cognition and memory are normal.  Nursing note and vitals reviewed.   Results for orders placed or  performed in visit on 03/30/18  Bayer DCA Hb A1c Waived  Result Value Ref Range   HB A1C (BAYER DCA - WAIVED) 5.2 <7.0 %  CBC with Differential/Platelet  Result Value Ref Range   WBC 5.7 3.4 - 10.8 x10E3/uL   RBC 4.45 3.77 - 5.28 x10E6/uL   Hemoglobin 11.8 11.1 - 15.9 g/dL   Hematocrit 35.5 34.0 - 46.6 %   MCV 80 79 - 97 fL   MCH 26.5 (L) 26.6 - 33.0 pg   MCHC 33.2 31.5 - 35.7 g/dL   RDW 14.0 12.3 - 15.4 %   Platelets 293 150 - 450 x10E3/uL   Neutrophils 54 Not Estab. %   Lymphs 34 Not Estab. %   Monocytes 9 Not Estab. %   Eos 3 Not Estab. %   Basos 0 Not Estab. %   Neutrophils Absolute 3.0 1.4 - 7.0 x10E3/uL   Lymphocytes Absolute 1.9 0.7 - 3.1 x10E3/uL   Monocytes Absolute 0.5 0.1 - 0.9 x10E3/uL   EOS (ABSOLUTE) 0.2 0.0 - 0.4 x10E3/uL   Basophils Absolute 0.0 0.0 - 0.2 x10E3/uL   Immature Granulocytes 0 Not Estab. %   Immature Grans (Abs) 0.0 0.0 - 0.1 x10E3/uL  Comprehensive metabolic panel  Result Value Ref Range   Glucose 74 65 - 99 mg/dL   BUN 11 6 - 24 mg/dL   Creatinine, Ser 0.89 0.57 - 1.00 mg/dL   GFR calc non Af Amer 79 >59 mL/min/1.73   GFR calc Af Amer 91 >59 mL/min/1.73   BUN/Creatinine Ratio 12 9 - 23   Sodium 142 134 - 144 mmol/L   Potassium 3.7 3.5 - 5.2 mmol/L   Chloride 102 96 - 106 mmol/L   CO2 26 20 - 29 mmol/L   Calcium 9.5 8.7 - 10.2 mg/dL   Total Protein 7.4 6.0 - 8.5 g/dL   Albumin 4.4 3.5 - 5.5 g/dL   Globulin, Total 3.0 1.5 - 4.5 g/dL   Albumin/Globulin Ratio 1.5 1.2 - 2.2   Bilirubin Total 0.3 0.0 - 1.2 mg/dL   Alkaline Phosphatase 94 39 - 117 IU/L   AST 13 0 - 40 IU/L   ALT 9 0 - 32 IU/L  VITAMIN D 25 Hydroxy (Vit-D Deficiency, Fractures)  Result Value Ref Range   Vit D, 25-Hydroxy 18.6 (L) 30.0 - 100.0 ng/mL  TSH  Result Value Ref Range   TSH 1.330 0.450 - 4.500 uIU/mL  Lipid Panel w/o Chol/HDL Ratio  Result Value Ref Range   Cholesterol, Total 211 (H) 100 - 199 mg/dL   Triglycerides 157 (H) 0 - 149 mg/dL   HDL 48 >39 mg/dL    VLDL Cholesterol Cal 31 5 - 40 mg/dL   LDL Calculated 132 (H) 0 - 99 mg/dL  Iron and TIBC  Result Value Ref Range   Total Iron Binding Capacity 437 250 - 450 ug/dL   UIBC 383 131 - 425 ug/dL   Iron 54 27 -  159 ug/dL   Iron Saturation 12 (L) 15 - 55 %  Ferritin  Result Value Ref Range   Ferritin 16 15 - 150 ng/mL  Cortisol  Result Value Ref Range   Cortisol 10.1 ug/dL      Assessment & Plan:   Problem List Items Addressed This Visit      Other   Anxiety    Stable on lexapro. Will start buspar to help with bad days. Call with any concerns. Continue to monitor.       Relevant Medications   busPIRone (BUSPAR) 5 MG tablet    Other Visit Diagnoses    Pathological fracture of rib with routine healing, subsequent encounter    -  Primary   Should not have broken rib just bending over. Will check DEXA. Await results.    Relevant Orders   DG Bone Density       Follow up plan: Return if symptoms worsen or fail to improve.

## 2018-07-14 ENCOUNTER — Ambulatory Visit: Payer: Self-pay | Admitting: Family Medicine

## 2018-07-14 ENCOUNTER — Encounter: Payer: Self-pay | Admitting: Family Medicine

## 2018-07-14 VITALS — BP 145/91 | HR 81 | Wt 196.8 lb

## 2018-07-14 DIAGNOSIS — F32A Depression, unspecified: Secondary | ICD-10-CM | POA: Insufficient documentation

## 2018-07-14 DIAGNOSIS — M8448XD Pathological fracture, other site, subsequent encounter for fracture with routine healing: Secondary | ICD-10-CM

## 2018-07-14 DIAGNOSIS — F419 Anxiety disorder, unspecified: Secondary | ICD-10-CM

## 2018-07-14 MED ORDER — BUSPIRONE HCL 5 MG PO TABS: 5.0000 mg | ORAL_TABLET | Freq: Three times a day (TID) | ORAL | 6 refills | Status: DC

## 2018-07-14 NOTE — Patient Instructions (Signed)
[facility]  Address: [address], [place], [place] [address]  Phone: [phone]  

## 2018-07-14 NOTE — Assessment & Plan Note (Addendum)
Stable on lexapro. Will start buspar to help with bad days. Call with any concerns. Continue to monitor.

## 2018-07-28 ENCOUNTER — Other Ambulatory Visit: Payer: BLUE CROSS/BLUE SHIELD

## 2018-08-14 ENCOUNTER — Other Ambulatory Visit: Payer: Self-pay | Admitting: Family Medicine

## 2018-08-14 ENCOUNTER — Telehealth: Payer: Self-pay | Admitting: Family Medicine

## 2018-08-14 DIAGNOSIS — G43009 Migraine without aura, not intractable, without status migrainosus: Secondary | ICD-10-CM

## 2018-08-14 MED ORDER — ONDANSETRON 4 MG PO TBDP: 4.0000 mg | ORAL_TABLET | Freq: Three times a day (TID) | ORAL | 0 refills | Status: DC | PRN

## 2018-08-14 NOTE — Telephone Encounter (Signed)
Copied from [place] [id number]. Topic: Quick Communication - See Telephone Encounter >> Aug 14, 2018  9:48 AM Antonieta Iba C wrote: CRM for notification. See Telephone encounter for: 08/14/18.  Pt called in to request something for nausea due to her migraine? Please advise.  Pharmacy: [place], [place] - [place] [phone] (Phone) [phone] (Fax)

## 2018-08-14 NOTE — Telephone Encounter (Signed)
Patient notified. Patient asked if she could have a referral to somewhere at [facility] for her headaches. She stated that her insurance will not cover it for [facility].

## 2018-08-14 NOTE — Telephone Encounter (Signed)
Requested medication (s) are due for refill today:yes  Requested medication (s) are on the active medication list: yes  Last refill: 07/08/18  Future visit scheduled: no  Notes to clinic:  Off protocol    Requested Prescriptions  Pending Prescriptions Disp Refills   cyanocobalamin (,VITAMIN B-12,) 1000 MCG/ML injection [Pharmacy Med Name: CYANOCOBALAM 1000MCGINJ]  2    Sig: INJECT 1 ML INTRAMUSCULARLY EVERY 14 DAYS     Off-Protocol Failed - 08/14/2018 11:19 AM      Failed - Medication not assigned to a protocol, review manually.      Passed - Valid encounter within last 12 months    Recent Outpatient Visits          1 month ago Pathological fracture of rib with routine healing, subsequent encounter   [place], Megan P, DO   3 months ago Viral upper respiratory tract infection   [place], Megan P, DO   4 months ago Chronic fatigue   [place], [place], DO   7 months ago Acute pain of right shoulder   [place], Megan P, DO   10 months ago Pyelonephritis   [place], Megan P, DO

## 2018-08-14 NOTE — Telephone Encounter (Signed)
zofran sent to her pharmacy

## 2018-08-14 NOTE — Telephone Encounter (Signed)
Referral generated

## 2018-08-17 NOTE — Telephone Encounter (Signed)
Refilled 1 month ago- should not be due

## 2018-08-24 ENCOUNTER — Telehealth: Payer: Self-pay | Admitting: Family Medicine

## 2018-08-24 NOTE — Telephone Encounter (Signed)
Copied from [place] [id number]. Topic: Referral - Status >> Aug 24, 2018  3:13 PM Cecelia Byars, NT wrote: Reason for CRM: Patient called to follow up on request to see someone for migraines , and the provider needs to someone in [place]  due to her insurance , provider , please call her at [phone]

## 2018-08-24 NOTE — Telephone Encounter (Signed)
Patient notified

## 2018-08-24 NOTE — Telephone Encounter (Signed)
Please let patient know referral was placed on 08/14/18. They should be calling her. She can call them if she'd like- (p) [phone]

## 2018-09-09 ENCOUNTER — Ambulatory Visit: Payer: Self-pay | Admitting: *Deleted

## 2018-09-09 NOTE — Telephone Encounter (Signed)
Returned call to patient regarding yeast infection. Pt states that she has a yeast infection and she is requesting Diflucan to be called in for her. She gets them frequently.  Requesting a call back regarding this medication today please. Routing to flow at [facility] for review.  Reason for Disposition . [1] Symptoms of a yeast infection (i.e., itchy, white discharge, not bad smelling) AND    [2] feels like prior vaginal yeast infections  Answer Assessment - Initial Assessment Questions 1. SYMPTOM: "What's the main symptom you're concerned about?" (e.g., pain, itching, dryness)     Pain, itching and dryness 2. LOCATION: "Where is the  itching located?" (e.g., inside/outside, left/right)     inside 3. ONSET: "When did the  itching  start?"     2 days ago 4. PAIN: "Is there any pain?" If so, ask: "How bad is it?" (Scale: 1-10; mild, moderate, severe)     Pain is more irritation 5. ITCHING: "Is there any itching?" If so, ask: "How bad is it?" (Scale: 1-10; mild, moderate, severe)     moderate 6. CAUSE: "What do you think is causing the discharge?" "Have you had the same problem before? What happened then?"     Yeast infection 7. OTHER SYMPTOMS: "Do you have any other symptoms?" (e.g., fever, itching, vaginal bleeding, pain with urination, injury to genital area, vaginal foreign body)     no 8. PREGNANCY: "Is there any chance you are pregnant?" "When was your last menstrual period?"     No had hysterectomy  Protocols used: VAGINAL [facility]

## 2018-09-10 MED ORDER — FLUCONAZOLE 150 MG PO TABS: 150.0000 mg | ORAL_TABLET | Freq: Once | ORAL | 0 refills | Status: AC

## 2018-09-10 NOTE — Addendum Note (Signed)
Addended by: Valerie Roys on: 09/10/2018 04:35 PM   Modules accepted: Orders

## 2018-09-15 ENCOUNTER — Encounter: Payer: Self-pay | Admitting: Family Medicine

## 2018-09-15 ENCOUNTER — Ambulatory Visit
Admission: RE | Admit: 2018-09-15 | Discharge: 2018-09-15 | Disposition: A | Discharge status: Home / Self Care | Payer: BLUE CROSS/BLUE SHIELD | Source: Ambulatory Visit | Attending: Family Medicine | Admitting: Family Medicine

## 2018-09-15 DIAGNOSIS — M8448XD Pathological fracture, other site, subsequent encounter for fracture with routine healing: Secondary | ICD-10-CM | POA: Diagnosis not present

## 2018-09-15 DIAGNOSIS — M810 Age-related osteoporosis without current pathological fracture: Secondary | ICD-10-CM | POA: Insufficient documentation

## 2018-09-18 ENCOUNTER — Encounter: Payer: Self-pay | Admitting: Family Medicine

## 2018-09-18 ENCOUNTER — Other Ambulatory Visit: Payer: Self-pay

## 2018-09-18 ENCOUNTER — Ambulatory Visit (INDEPENDENT_AMBULATORY_CARE_PROVIDER_SITE_OTHER): Payer: BLUE CROSS/BLUE SHIELD | Admitting: Family Medicine

## 2018-09-18 VITALS — BP 138/94 | HR 80 | Temp 98.2°F | Ht 68.0 in | Wt 194.0 lb

## 2018-09-18 DIAGNOSIS — G43009 Migraine without aura, not intractable, without status migrainosus: Secondary | ICD-10-CM | POA: Diagnosis not present

## 2018-09-18 DIAGNOSIS — M8080XA Other osteoporosis with current pathological fracture, unspecified site, initial encounter for fracture: Secondary | ICD-10-CM | POA: Diagnosis not present

## 2018-09-18 DIAGNOSIS — M8448XD Pathological fracture, other site, subsequent encounter for fracture with routine healing: Secondary | ICD-10-CM | POA: Diagnosis not present

## 2018-09-18 MED ORDER — GALCANEZUMAB-GNLM 120 MG/ML ~~LOC~~ SOAJ: 1.0000 "pen " | SUBCUTANEOUS | 3 refills | Status: DC

## 2018-09-18 MED ORDER — IBANDRONATE SODIUM 150 MG PO TABS: 150.0000 mg | ORAL_TABLET | ORAL | 0 refills | Status: DC

## 2018-09-18 NOTE — Progress Notes (Signed)
BP (!) 138/94   Pulse 80   Temp 98.2 F (36.8 C) (Oral)   Ht 5\' 8"  (1.727 m)   Wt 194 lb (88 kg)   SpO2 98%   BMI 29.50 kg/m    Subjective:    Patient ID: Tina Harvey, female    DOB: 1971-07-25, 47 y.o.   MRN: [id number]  HPI: Tina Harvey is a 47 y.o. female  Chief Complaint  Patient presents with  . Headache    f/u   Having headaches since she was 47 years old. They have been severe and debilitating. She has seen several neurologists- [place], [place], [employer]- lost her insurance about a year ago and that caused her to stop seeing them and she had to just deal with it. She has never had major reactions to any medications, but it would stop working and her migraines would come back stronger. She has tried topamax in the past years ago 10+ years ago. Tried amitriptyline and nortriptyline also years ago- they made her feel "squirrely" so they stopped that. Not sure if she was on propranolol. She had been on several anti-depressants. Has had botox injections 60+ at a time. Got the migraine piercing about 2-3 months- helps with frequency but not intensity. Tramadol that took a little bit of the edge off, but didn't help a lot. She notes that 15 years ago, she spent some time in the psychiatric unit due to severe migraines. At the worse she would have 1-2 migraine free days a month. Now feeling like her migraines are cycling again. She is now having about 12-15 migraine days a month. + aura, especially smells and hearing. + Nausea and vomiting. She would be interested in trying anything- including clinical trials. She has never tried a biologic due to issues with her insurance. Would be willing to try it. No other concerns or complaints at this time. Just had DEXA done which showed a T score of -2.5 in her lumbar.   Relevant past medical, surgical, family and social history reviewed and updated as indicated. Interim medical history since our last visit reviewed. Allergies and medications reviewed  and updated.  Review of Systems  Constitutional: Negative.   Respiratory: Negative.   Cardiovascular: Negative.   Musculoskeletal: Positive for myalgias, neck pain and neck stiffness. Negative for arthralgias, back pain, gait problem and joint swelling.  Skin: Negative.   Neurological: Positive for headaches. Negative for dizziness, tremors, seizures, syncope, facial asymmetry, speech difficulty, weakness, light-headedness and numbness.  Hematological: Negative.   Psychiatric/Behavioral: Negative.     Per HPI unless specifically indicated above     Objective:    BP (!) 138/94   Pulse 80   Temp 98.2 F (36.8 C) (Oral)   Ht 5\' 8"  (1.727 m)   Wt 194 lb (88 kg)   SpO2 98%   BMI 29.50 kg/m   Wt Readings from Last 3 Encounters:  09/18/18 194 lb (88 kg)  07/14/18 196 lb 12.8 oz (89.3 kg)  05/08/18 197 lb 5 oz (89.5 kg)    Physical Exam Vitals signs and nursing note reviewed.  Constitutional:      General: She is not in acute distress.    Appearance: Normal appearance. She is not ill-appearing, toxic-appearing or diaphoretic.  HENT:     Head: Normocephalic and atraumatic.     Right Ear: External ear normal.     Left Ear: External ear normal.     Nose: Nose normal.     Mouth/Throat:  Mouth: Mucous membranes are moist.     Pharynx: Oropharynx is clear.  Eyes:     General: No scleral icterus.       Right eye: No discharge.        Left eye: No discharge.     Extraocular Movements: Extraocular movements intact.     Conjunctiva/sclera: Conjunctivae normal.     Pupils: Pupils are equal, round, and reactive to light.  Neck:     Musculoskeletal: Normal range of motion and neck supple.  Cardiovascular:     Rate and Rhythm: Normal rate and regular rhythm.     Pulses: Normal pulses.     Heart sounds: Normal heart sounds. No murmur. No friction rub. No gallop.   Pulmonary:     Effort: Pulmonary effort is normal. No respiratory distress.     Breath sounds: Normal breath  sounds. No stridor. No wheezing, rhonchi or rales.  Chest:     Chest wall: No tenderness.  Musculoskeletal: Normal range of motion.  Skin:    General: Skin is warm and dry.     Capillary Refill: Capillary refill takes less than 2 seconds.     Coloration: Skin is not jaundiced or pale.     Findings: No bruising, erythema, lesion or rash.  Neurological:     General: No focal deficit present.     Mental Status: She is alert and oriented to person, place, and time. Mental status is at baseline.  Psychiatric:        Mood and Affect: Mood normal.        Behavior: Behavior normal.        Thought Content: Thought content normal.        Judgment: Judgment normal.     Results for orders placed or performed in visit on 03/30/18  Bayer DCA Hb A1c Waived  Result Value Ref Range   HB A1C (BAYER DCA - WAIVED) 5.2 <7.0 %  CBC with Differential/Platelet  Result Value Ref Range   WBC 5.7 3.4 - 10.8 x10E3/uL   RBC 4.45 3.77 - 5.28 x10E6/uL   Hemoglobin 11.8 11.1 - 15.9 g/dL   Hematocrit 35.5 34.0 - 46.6 %   MCV 80 79 - 97 fL   MCH 26.5 (L) 26.6 - 33.0 pg   MCHC 33.2 31.5 - 35.7 g/dL   RDW 14.0 12.3 - 15.4 %   Platelets 293 150 - 450 x10E3/uL   Neutrophils 54 Not Estab. %   Lymphs 34 Not Estab. %   Monocytes 9 Not Estab. %   Eos 3 Not Estab. %   Basos 0 Not Estab. %   Neutrophils Absolute 3.0 1.4 - 7.0 x10E3/uL   Lymphocytes Absolute 1.9 0.7 - 3.1 x10E3/uL   Monocytes Absolute 0.5 0.1 - 0.9 x10E3/uL   EOS (ABSOLUTE) 0.2 0.0 - 0.4 x10E3/uL   Basophils Absolute 0.0 0.0 - 0.2 x10E3/uL   Immature Granulocytes 0 Not Estab. %   Immature Grans (Abs) 0.0 0.0 - 0.1 x10E3/uL  Comprehensive metabolic panel  Result Value Ref Range   Glucose 74 65 - 99 mg/dL   BUN 11 6 - 24 mg/dL   Creatinine, Ser 0.89 0.57 - 1.00 mg/dL   GFR calc non Af Amer 79 >59 mL/min/1.73   GFR calc Af Amer 91 >59 mL/min/1.73   BUN/Creatinine Ratio 12 9 - 23   Sodium 142 134 - 144 mmol/L   Potassium 3.7 3.5 - 5.2 mmol/L     Chloride 102 96 - 106 mmol/L  CO2 26 20 - 29 mmol/L   Calcium 9.5 8.7 - 10.2 mg/dL   Total Protein 7.4 6.0 - 8.5 g/dL   Albumin 4.4 3.5 - 5.5 g/dL   Globulin, Total 3.0 1.5 - 4.5 g/dL   Albumin/Globulin Ratio 1.5 1.2 - 2.2   Bilirubin Total 0.3 0.0 - 1.2 mg/dL   Alkaline Phosphatase 94 39 - 117 IU/L   AST 13 0 - 40 IU/L   ALT 9 0 - 32 IU/L  VITAMIN D 25 Hydroxy (Vit-D Deficiency, Fractures)  Result Value Ref Range   Vit D, 25-Hydroxy 18.6 (L) 30.0 - 100.0 ng/mL  TSH  Result Value Ref Range   TSH 1.330 0.450 - 4.500 uIU/mL  Lipid Panel w/o Chol/HDL Ratio  Result Value Ref Range   Cholesterol, Total 211 (H) 100 - 199 mg/dL   Triglycerides 157 (H) 0 - 149 mg/dL   HDL 48 >39 mg/dL   VLDL Cholesterol Cal 31 5 - 40 mg/dL   LDL Calculated 132 (H) 0 - 99 mg/dL  Iron and TIBC  Result Value Ref Range   Total Iron Binding Capacity 437 250 - 450 ug/dL   UIBC 383 131 - 425 ug/dL   Iron 54 27 - 159 ug/dL   Iron Saturation 12 (L) 15 - 55 %  Ferritin  Result Value Ref Range   Ferritin 16 15 - 150 ng/mL  Cortisol  Result Value Ref Range   Cortisol 10.1 ug/dL      Assessment & Plan:   Problem List Items Addressed This Visit      Cardiovascular and Mediastinum   Migraine without aura and without status migrainosus, not intractable - Primary    Chronic and severe. Had been getting a little better, but now cycling again and worse. Has tried topamax, amitriptyline, triptans, tramadol, botox, piercing, massage, PT- all help for a small amount of time, and then get worse again. She would like to see the headache clinic at [facility]. Will get her into see them. Referral was generated, will check in on this. We will start her on emgality to see if that will help. Samples given today- recheck 1 month to see how her head is doing. Call with any concerns.       Relevant Medications   Galcanezumab-gnlm (EMGALITY) 120 MG/ML SOAJ     Musculoskeletal and Integument   Osteoporosis    Newly diagnosed  on her DEXA- will start boniva and recheck bone density next year. Call with any concerns.       Relevant Medications   ibandronate (BONIVA) 150 MG tablet    Other Visit Diagnoses    Pathological fracture of rib with routine healing, subsequent encounter       Newly diagnosed with osteoporosis. Will start medicine. Call with any concerns.        Follow up plan: Return 4-8 weeks, for follow up migraines.

## 2018-09-18 NOTE — Assessment & Plan Note (Signed)
Newly diagnosed on her DEXA- will start boniva and recheck bone density next year. Call with any concerns.

## 2018-09-18 NOTE — Assessment & Plan Note (Signed)
Chronic and severe. Had been getting a little better, but now cycling again and worse. Has tried topamax, amitriptyline, triptans, tramadol, botox, piercing, massage, PT- all help for a small amount of time, and then get worse again. She would like to see the headache clinic at [facility]. Will get her into see them. Referral was generated, will check in on this. We will start her on emgality to see if that will help. Samples given today- recheck 1 month to see how her head is doing. Call with any concerns.

## 2018-10-12 ENCOUNTER — Encounter: Payer: Self-pay | Admitting: Family Medicine

## 2018-10-14 ENCOUNTER — Telehealth: Payer: Self-pay

## 2018-10-14 NOTE — Telephone Encounter (Signed)
PA for [facility] initiated through Cover My Meds. Will await determination. Key: [id number]

## 2018-10-16 NOTE — Telephone Encounter (Signed)
PA was denied

## 2018-10-16 NOTE — Telephone Encounter (Signed)
PA still pending determination.

## 2018-10-19 NOTE — Telephone Encounter (Signed)
Received denial letter for [employer]. It states that this was denied due to the patient receiving Botox in the last 3 months. Dr. [name], do you know if it was in the last 3 months or longer than that since she tried this? (I got this information from the last OV note)

## 2018-10-19 NOTE — Telephone Encounter (Signed)
PA resubmitted with corrected information and approved.

## 2018-10-19 NOTE — Telephone Encounter (Signed)
She has not had botox in the last 3 months. She had botox years ago, but hasn't seen a neurologist in over a year as far as I know.

## 2018-11-14 ENCOUNTER — Other Ambulatory Visit: Payer: Self-pay | Admitting: Family Medicine

## 2018-11-16 NOTE — Telephone Encounter (Signed)
Requested Prescriptions  Pending Prescriptions Disp Refills  . cyanocobalamin (,VITAMIN B-12,) 1000 MCG/ML injection [Pharmacy Med Name: Cyanocobalamin 1000 MCG/ML Injection Solution] 2 mL 0    Sig: INJECT 1 ML INTO THE MUSCLE EVERY 14 DAYS     Off-Protocol Failed - 11/14/2018 10:36 AM      Failed - Medication not assigned to a protocol, review manually.      Passed - Valid encounter within last 12 months    Recent Outpatient Visits          1 month ago Migraine without aura and without status migrainosus, not intractable   [place], Megan P, DO   4 months ago Pathological fracture of rib with routine healing, subsequent encounter   [place], Megan P, DO   6 months ago Viral upper respiratory tract infection   [facility] [place], Megan P, DO   7 months ago Chronic fatigue   [facility] [place], [place], DO   10 months ago Acute pain of right shoulder   [facility] Valerie Roys, DO      Future Appointments            In 2 weeks [name], Barb Merino, DO [facility]

## 2018-11-30 ENCOUNTER — Ambulatory Visit (INDEPENDENT_AMBULATORY_CARE_PROVIDER_SITE_OTHER): Payer: BLUE CROSS/BLUE SHIELD | Admitting: Family Medicine

## 2018-11-30 ENCOUNTER — Other Ambulatory Visit: Payer: Self-pay | Admitting: Family Medicine

## 2018-11-30 ENCOUNTER — Encounter: Payer: Self-pay | Admitting: Family Medicine

## 2018-11-30 VITALS — BP 155/86 | HR 71 | Temp 98.1°F | Wt 192.6 lb

## 2018-11-30 DIAGNOSIS — G43009 Migraine without aura, not intractable, without status migrainosus: Secondary | ICD-10-CM | POA: Diagnosis not present

## 2018-11-30 DIAGNOSIS — K219 Gastro-esophageal reflux disease without esophagitis: Secondary | ICD-10-CM | POA: Insufficient documentation

## 2018-11-30 DIAGNOSIS — R0982 Postnasal drip: Secondary | ICD-10-CM | POA: Diagnosis not present

## 2018-11-30 MED ORDER — SUCRALFATE 1 G PO TABS: 1.0000 g | ORAL_TABLET | Freq: Four times a day (QID) | ORAL | 11 refills | Status: DC

## 2018-11-30 MED ORDER — PANTOPRAZOLE SODIUM 40 MG PO TBEC: 40.0000 mg | DELAYED_RELEASE_TABLET | Freq: Every day | ORAL | 1 refills | Status: DC

## 2018-11-30 MED ORDER — PREDNISONE 50 MG PO TABS: 50.0000 mg | ORAL_TABLET | Freq: Every day | ORAL | 0 refills | Status: DC

## 2018-11-30 NOTE — Assessment & Plan Note (Signed)
Stable on current regimen. Continue current regimen. Continue to monitor. Call with any concerns.  

## 2018-11-30 NOTE — Assessment & Plan Note (Signed)
Not doing well at all. Minor benefit from emgality. To see neurology tomorrow. Call with any concerns. Continue to monitor.

## 2018-11-30 NOTE — Progress Notes (Signed)
BP (!) 155/86   Pulse 71   Temp 98.1 F (36.7 C) (Oral)   Wt 192 lb 9.6 oz (87.4 kg)   SpO2 97%   BMI 29.28 kg/m    Subjective:    Patient ID: Tina Harvey, female    DOB: 1971-07-19, 47 y.o.   MRN: [id number]  HPI: Tina Harvey is a 48 y.o. female  Chief Complaint  Patient presents with  . Migraine    pt states she is here do discuss [employer]  . Medication Refill    pt states she needs pantoprazole and carafate  . Sinus Problem    pt states she feels like she is constantly clearing her throat and unable to take a deep breath since having the flu    MIGRAINES- has been doing a little better. Still having headaches about every day- but seems to be due to the barometric pressure change. To see neurology tomorrow. Feels like the medicine has helped. She feels like the intensity of her headaches have gone up, but the number have been down except when the weather changes. She is due to see neurology tomorrow to see if they can help.    GERD GERD control status: stable  Satisfied with current treatment? yes Heartburn frequency: occasionally Medication side effects: no  Medication compliance: excellent Dysphagia: no Odynophagia:  no Hematemesis: no Blood in stool: no EGD: no  UPPER RESPIRATORY TRACT INFECTION- has gotten rid of her cold, but has been having drainage and not able to take a deep breath Duration: 6 weeks Worst symptom: PNC Fever: no Cough: no Shortness of breath: no Wheezing: no Chest pain: no Chest tightness: yes Chest congestion: yes Nasal congestion: no Runny nose: no Post nasal drip: yes Sneezing: no Sore throat: no Swollen glands: no Sinus pressure: no Headache: yes Face pain: no Toothache: no Ear pain: no  Ear pressure: no  Eyes red/itching:no Eye drainage/crusting: no  Vomiting: no Rash: no Fatigue: yes Sick contacts: no Strep contacts: no  Context: stable Recurrent sinusitis: no Relief with OTC cold/cough medications: no    Treatments attempted: none  Relevant past medical, surgical, family and social history reviewed and updated as indicated. Interim medical history since our last visit reviewed. Allergies and medications reviewed and updated.  Review of Systems  Constitutional: Negative.   HENT: Positive for postnasal drip. Negative for congestion, dental problem, drooling, ear discharge, ear pain, facial swelling, hearing loss, mouth sores, nosebleeds, rhinorrhea, sinus pressure, sinus pain, sneezing, sore throat, tinnitus, trouble swallowing and voice change.   Respiratory: Negative.   Cardiovascular: Negative.   Gastrointestinal: Negative.   Neurological: Positive for headaches. Negative for dizziness, tremors, seizures, syncope, facial asymmetry, speech difficulty, weakness, light-headedness and numbness.  Hematological: Negative.   Psychiatric/Behavioral: Negative.     Per HPI unless specifically indicated above     Objective:    BP (!) 155/86   Pulse 71   Temp 98.1 F (36.7 C) (Oral)   Wt 192 lb 9.6 oz (87.4 kg)   SpO2 97%   BMI 29.28 kg/m   Wt Readings from Last 3 Encounters:  11/30/18 192 lb 9.6 oz (87.4 kg)  09/18/18 194 lb (88 kg)  07/14/18 196 lb 12.8 oz (89.3 kg)    Physical Exam Vitals signs and nursing note reviewed.  Constitutional:      General: She is not in acute distress.    Appearance: Normal appearance. She is not ill-appearing, toxic-appearing or diaphoretic.  HENT:  Head: Normocephalic and atraumatic.     Right Ear: External ear normal.     Left Ear: External ear normal.     Nose: Nose normal.     Mouth/Throat:     Mouth: Mucous membranes are moist.     Pharynx: Oropharynx is clear.  Eyes:     General: No scleral icterus.       Right eye: No discharge.        Left eye: No discharge.     Extraocular Movements: Extraocular movements intact.     Conjunctiva/sclera: Conjunctivae normal.     Pupils: Pupils are equal, round, and reactive to light.  Neck:      Musculoskeletal: Normal range of motion and neck supple.  Cardiovascular:     Rate and Rhythm: Normal rate and regular rhythm.     Pulses: Normal pulses.     Heart sounds: Normal heart sounds. No murmur. No friction rub. No gallop.   Pulmonary:     Effort: Pulmonary effort is normal. No respiratory distress.     Breath sounds: Normal breath sounds. No stridor. No wheezing, rhonchi or rales.  Chest:     Chest wall: No tenderness.  Musculoskeletal: Normal range of motion.  Skin:    General: Skin is warm and dry.     Capillary Refill: Capillary refill takes less than 2 seconds.     Coloration: Skin is not jaundiced or pale.     Findings: No bruising, erythema, lesion or rash.  Neurological:     General: No focal deficit present.     Mental Status: She is alert and oriented to person, place, and time. Mental status is at baseline.  Psychiatric:        Mood and Affect: Mood normal.        Behavior: Behavior normal.        Thought Content: Thought content normal.        Judgment: Judgment normal.     Results for orders placed or performed in visit on 03/30/18  Bayer DCA Hb A1c Waived  Result Value Ref Range   HB A1C (BAYER DCA - WAIVED) 5.2 <7.0 %  CBC with Differential/Platelet  Result Value Ref Range   WBC 5.7 3.4 - 10.8 x10E3/uL   RBC 4.45 3.77 - 5.28 x10E6/uL   Hemoglobin 11.8 11.1 - 15.9 g/dL   Hematocrit 35.5 34.0 - 46.6 %   MCV 80 79 - 97 fL   MCH 26.5 (L) 26.6 - 33.0 pg   MCHC 33.2 31.5 - 35.7 g/dL   RDW 14.0 12.3 - 15.4 %   Platelets 293 150 - 450 x10E3/uL   Neutrophils 54 Not Estab. %   Lymphs 34 Not Estab. %   Monocytes 9 Not Estab. %   Eos 3 Not Estab. %   Basos 0 Not Estab. %   Neutrophils Absolute 3.0 1.4 - 7.0 x10E3/uL   Lymphocytes Absolute 1.9 0.7 - 3.1 x10E3/uL   Monocytes Absolute 0.5 0.1 - 0.9 x10E3/uL   EOS (ABSOLUTE) 0.2 0.0 - 0.4 x10E3/uL   Basophils Absolute 0.0 0.0 - 0.2 x10E3/uL   Immature Granulocytes 0 Not Estab. %   Immature Grans (Abs) 0.0  0.0 - 0.1 x10E3/uL  Comprehensive metabolic panel  Result Value Ref Range   Glucose 74 65 - 99 mg/dL   BUN 11 6 - 24 mg/dL   Creatinine, Ser 0.89 0.57 - 1.00 mg/dL   GFR calc non Af Amer 79 >59 mL/min/1.73   GFR calc Af  Amer 91 >59 mL/min/1.73   BUN/Creatinine Ratio 12 9 - 23   Sodium 142 134 - 144 mmol/L   Potassium 3.7 3.5 - 5.2 mmol/L   Chloride 102 96 - 106 mmol/L   CO2 26 20 - 29 mmol/L   Calcium 9.5 8.7 - 10.2 mg/dL   Total Protein 7.4 6.0 - 8.5 g/dL   Albumin 4.4 3.5 - 5.5 g/dL   Globulin, Total 3.0 1.5 - 4.5 g/dL   Albumin/Globulin Ratio 1.5 1.2 - 2.2   Bilirubin Total 0.3 0.0 - 1.2 mg/dL   Alkaline Phosphatase 94 39 - 117 IU/L   AST 13 0 - 40 IU/L   ALT 9 0 - 32 IU/L  VITAMIN D 25 Hydroxy (Vit-D Deficiency, Fractures)  Result Value Ref Range   Vit D, 25-Hydroxy 18.6 (L) 30.0 - 100.0 ng/mL  TSH  Result Value Ref Range   TSH 1.330 0.450 - 4.500 uIU/mL  Lipid Panel w/o Chol/HDL Ratio  Result Value Ref Range   Cholesterol, Total 211 (H) 100 - 199 mg/dL   Triglycerides 157 (H) 0 - 149 mg/dL   HDL 48 >39 mg/dL   VLDL Cholesterol Cal 31 5 - 40 mg/dL   LDL Calculated 132 (H) 0 - 99 mg/dL  Iron and TIBC  Result Value Ref Range   Total Iron Binding Capacity 437 250 - 450 ug/dL   UIBC 383 131 - 425 ug/dL   Iron 54 27 - 159 ug/dL   Iron Saturation 12 (L) 15 - 55 %  Ferritin  Result Value Ref Range   Ferritin 16 15 - 150 ng/mL  Cortisol  Result Value Ref Range   Cortisol 10.1 ug/dL      Assessment & Plan:   Problem List Items Addressed This Visit      Cardiovascular and Mediastinum   Migraine without aura and without status migrainosus, not intractable - Primary    Not doing well at all. Minor benefit from emgality. To see neurology tomorrow. Call with any concerns. Continue to monitor.         Digestive   Gastroesophageal reflux disease    Stable on current regimen. Continue current regimen. Continue to monitor. Call with any concerns.       Relevant  Medications   pantoprazole (PROTONIX) 40 MG tablet   sucralfate (CARAFATE) 1 g tablet    Other Visit Diagnoses    Post-nasal drip       Will treat with prednisone burst and antihistamines. Call with any concerns. Continue to monitor.        Follow up plan: Return As able, for Physical.

## 2018-12-01 NOTE — Telephone Encounter (Signed)
Requested Prescriptions  Pending Prescriptions Disp Refills  . escitalopram (LEXAPRO) 10 MG tablet [Pharmacy Med Name: Escitalopram Oxalate 10 MG Oral Tablet] 90 tablet 0    Sig: TAKE 1/2 TABLET BY MOUTH FOR 1 TO 2 WEEKS AND THEN INCREASE TO 1 TABLET ONCE DAILY     Psychiatry:  Antidepressants - SSRI Passed - 11/30/2018  5:25 PM      Passed - Valid encounter within last 6 months    Recent Outpatient Visits          Yesterday Migraine without aura and without status migrainosus, not intractable   [facility] [place], Megan P, DO   2 months ago Migraine without aura and without status migrainosus, not intractable   [employer], Megan P, DO   4 months ago Pathological fracture of rib with routine healing, subsequent encounter   [place], Megan P, DO   6 months ago Viral upper respiratory tract infection   [facility] [place], [place], DO   8 months ago Chronic fatigue   [facility] [place], [place], DO      Future Appointments            In 2 months [name], Barb Merino, DO [employer], [facility]

## 2018-12-30 ENCOUNTER — Other Ambulatory Visit: Payer: Self-pay | Admitting: Family Medicine

## 2018-12-30 NOTE — Telephone Encounter (Signed)
Requested medication (s) are due for refill today -yes  Requested medication (s) are on the active medication list -yes  Future visit scheduled -yes  Last refill: - 11/16/18  Notes to clinic: patient is requesting a medication not assigned to protocol- sent for PCP review   Requested Prescriptions  Pending Prescriptions Disp Refills   cyanocobalamin (,VITAMIN B-12,) 1000 MCG/ML injection [Pharmacy Med Name: Cyanocobalamin 1000 MCG/ML Injection Solution] 2 mL 0    Sig: INJECT 1 ML INTO THE MUSCLE EVERY 14 DAYS     Off-Protocol Failed - 12/30/2018  9:08 AM      Failed - Medication not assigned to a protocol, review manually.      Passed - Valid encounter within last 12 months    Recent Outpatient Visits          1 month ago Migraine without aura and without status migrainosus, not intractable   [place], Megan P, DO   3 months ago Migraine without aura and without status migrainosus, not intractable   [place], Megan P, DO   5 months ago Pathological fracture of rib with routine healing, subsequent encounter   [place], Megan P, DO   7 months ago Viral upper respiratory tract infection   [facility] [place], Megan P, DO   9 months ago Chronic fatigue   [employer], [place], DO      Future Appointments            In 1 month [place], Megan P, DO [place], [facility]          Vitamin D, Ergocalciferol, (DRISDOL) 1.25 MG (50000 UT) CAPS capsule [Pharmacy Med Name: Vitamin D (Ergocalciferol) 50000 UNIT Oral Capsule] 44 capsule 0    Sig: Take 1 capsule by mouth once a week     Endocrinology:  Vitamins - Vitamin D Supplementation Failed - 12/30/2018  9:08 AM      Failed - 50,000 IU strengths are not delegated      Failed - Phosphate in normal range and within 360 days    Phosphorus  Date Value Ref Range Status  10/10/2014 3.3 2.5 - 4.9 mg/dL Final         Failed -  Vitamin D in normal range and within 360 days    Vit D, 1,25-Dihydroxy  Date Value Ref Range Status  04/06/2015 57.7 19.9 - 79.3 pg/mL Final   Vit D, 25-Hydroxy  Date Value Ref Range Status  03/30/2018 18.6 (L) 30.0 - 100.0 ng/mL Final    Comment:    Vitamin D deficiency has been defined by the Institute of Medicine and an Endocrine Society practice guideline as a level of serum 25-OH vitamin D less than 20 ng/mL (1,2). The Endocrine Society went on to further define vitamin D insufficiency as a level between 21 and 29 ng/mL (2). 1. IOM (Institute of Medicine). 2010. Dietary reference    intakes for calcium and D. [place]: The    [employer]. 2. Holick MF, Binkley [place], Bischoff-Ferrari HA, et al.    Evaluation, treatment, and prevention of vitamin D    deficiency: an Endocrine Society clinical practice    guideline. JCEM. 2011 Jul; 96(7):1911-30.          Passed - Ca in normal range and within 360 days    Calcium  Date Value Ref Range Status  03/30/2018 9.5 8.7 - 10.2 mg/dL Final   Calcium, Total  Date Value Ref Range Status  10/10/2014 9.2 8.5 - 10.1 mg/dL Final         Passed - Valid encounter within last 12 months    Recent Outpatient Visits          1 month ago Migraine without aura and without status migrainosus, not intractable   [place], Megan P, DO   3 months ago Migraine without aura and without status migrainosus, not intractable   [employer], Megan P, DO   5 months ago Pathological fracture of rib with routine healing, subsequent encounter   [place], Megan P, DO   7 months ago Viral upper respiratory tract infection   [facility] [place], Megan P, DO   9 months ago Chronic fatigue   [employer], [place], DO      Future Appointments            In 1 month Johnson, Megan P, DO [place], [facility]            Requested  Prescriptions  Pending Prescriptions Disp Refills   cyanocobalamin (,VITAMIN B-12,) 1000 MCG/ML injection [Pharmacy Med Name: Cyanocobalamin 1000 MCG/ML Injection Solution] 2 mL 0    Sig: INJECT 1 ML INTO THE MUSCLE EVERY 14 DAYS     Off-Protocol Failed - 12/30/2018  9:08 AM      Failed - Medication not assigned to a protocol, review manually.      Passed - Valid encounter within last 12 months    Recent Outpatient Visits          1 month ago Migraine without aura and without status migrainosus, not intractable   [place], Megan P, DO   3 months ago Migraine without aura and without status migrainosus, not intractable   [place], Megan P, DO   5 months ago Pathological fracture of rib with routine healing, subsequent encounter   [place], Megan P, DO   7 months ago Viral upper respiratory tract infection   [facility] [place], Megan P, DO   9 months ago Chronic fatigue   [employer], [place], DO      Future Appointments            In 1 month Johnson, Megan P, DO [place], [facility]          Vitamin D, Ergocalciferol, (DRISDOL) 1.25 MG (50000 UT) CAPS capsule [Pharmacy Med Name: Vitamin D (Ergocalciferol) 50000 UNIT Oral Capsule] 44 capsule 0    Sig: Take 1 capsule by mouth once a week     Endocrinology:  Vitamins - Vitamin D Supplementation Failed - 12/30/2018  9:08 AM      Failed - 50,000 IU strengths are not delegated      Failed - Phosphate in normal range and within 360 days    Phosphorus  Date Value Ref Range Status  10/10/2014 3.3 2.5 - 4.9 mg/dL Final         Failed - Vitamin D in normal range and within 360 days    Vit D, 1,25-Dihydroxy  Date Value Ref Range Status  04/06/2015 57.7 19.9 - 79.3 pg/mL Final   Vit D, 25-Hydroxy  Date Value Ref Range Status  03/30/2018 18.6 (L) 30.0 - 100.0 ng/mL Final    Comment:    Vitamin D deficiency has been  defined by the Institute of Medicine and an [place]  practice guideline as a level of serum 25-OH vitamin D less than 20 ng/mL (1,2). The Endocrine Society went on to further define vitamin D insufficiency as a level between 21 and 29 ng/mL (2). 1. IOM (Institute of Medicine). 2010. Dietary reference    intakes for calcium and D. [place]: The    [employer]. 2. Holick MF, Binkley [place], Bischoff-Ferrari HA, et al.    Evaluation, treatment, and prevention of vitamin D    deficiency: an Endocrine Society clinical practice    guideline. JCEM. 2011 Jul; 96(7):1911-30.          Passed - Ca in normal range and within 360 days    Calcium  Date Value Ref Range Status  03/30/2018 9.5 8.7 - 10.2 mg/dL Final   Calcium, Total  Date Value Ref Range Status  10/10/2014 9.2 8.5 - 10.1 mg/dL Final         Passed - Valid encounter within last 12 months    Recent Outpatient Visits          1 month ago Migraine without aura and without status migrainosus, not intractable   [place], Megan P, DO   3 months ago Migraine without aura and without status migrainosus, not intractable   [place], Megan P, DO   5 months ago Pathological fracture of rib with routine healing, subsequent encounter   [place], Megan P, DO   7 months ago Viral upper respiratory tract infection   [facility] [place], Megan P, DO   9 months ago Chronic fatigue   [facility] [place], [place], DO      Future Appointments            In 1 month Johnson, Barb Merino, DO [employer], [facility]

## 2019-01-04 ENCOUNTER — Telehealth: Payer: Self-pay

## 2019-01-04 NOTE — Telephone Encounter (Signed)
Prior Authorization initiated via CoverMyMeds for [employer] Key: [id number]  Documentation uploaded from 2017. PA approved.

## 2019-01-21 ENCOUNTER — Other Ambulatory Visit: Payer: Self-pay | Admitting: Family Medicine

## 2019-02-08 ENCOUNTER — Encounter: Payer: BLUE CROSS/BLUE SHIELD | Admitting: Family Medicine

## 2019-03-23 ENCOUNTER — Telehealth: Payer: Self-pay

## 2019-03-23 NOTE — Telephone Encounter (Signed)
Prior Authorization initiated via CoverMyMeds for [employer] 120MG /ML auto-injectors Key: [id number]

## 2019-03-24 NOTE — Telephone Encounter (Signed)
Prior Authorization Approved 

## 2019-03-29 ENCOUNTER — Encounter: Payer: Self-pay | Admitting: Family Medicine

## 2019-03-29 ENCOUNTER — Ambulatory Visit
Admission: RE | Admit: 2019-03-29 | Discharge: 2019-03-29 | Disposition: A | Discharge status: Home / Self Care | Payer: BLUE CROSS/BLUE SHIELD | Attending: Family Medicine | Admitting: Family Medicine

## 2019-03-29 ENCOUNTER — Other Ambulatory Visit: Payer: Self-pay

## 2019-03-29 ENCOUNTER — Ambulatory Visit
Admission: RE | Admit: 2019-03-29 | Discharge: 2019-03-29 | Disposition: A | Discharge status: Home / Self Care | Payer: BLUE CROSS/BLUE SHIELD | Source: Ambulatory Visit | Attending: Family Medicine | Admitting: Family Medicine

## 2019-03-29 ENCOUNTER — Ambulatory Visit (INDEPENDENT_AMBULATORY_CARE_PROVIDER_SITE_OTHER): Payer: BLUE CROSS/BLUE SHIELD | Admitting: Family Medicine

## 2019-03-29 VITALS — BP 130/88 | HR 71 | Temp 98.1°F | Ht 68.0 in | Wt 190.0 lb

## 2019-03-29 DIAGNOSIS — M546 Pain in thoracic spine: Secondary | ICD-10-CM

## 2019-03-29 DIAGNOSIS — M79672 Pain in left foot: Secondary | ICD-10-CM | POA: Diagnosis not present

## 2019-03-29 DIAGNOSIS — M8000XD Age-related osteoporosis with current pathological fracture, unspecified site, subsequent encounter for fracture with routine healing: Secondary | ICD-10-CM

## 2019-03-29 DIAGNOSIS — W19XXXD Unspecified fall, subsequent encounter: Secondary | ICD-10-CM | POA: Insufficient documentation

## 2019-03-29 DIAGNOSIS — S2232XD Fracture of one rib, left side, subsequent encounter for fracture with routine healing: Secondary | ICD-10-CM

## 2019-03-29 NOTE — Progress Notes (Signed)
BP 130/88   Pulse 71   Temp 98.1 F (36.7 C) (Oral)   Ht 5\' 8"  (1.727 m)   Wt 190 lb (86.2 kg)   SpO2 98%   BMI 28.89 kg/m    Subjective:    Patient ID: Real Cons, female    DOB: [birth year], 48 y.o.   MRN: [id number]  HPI: Tina Harvey is a 48 y.o. female  Chief Complaint  Patient presents with  . Follow-up    rib fracture   [place] in [place] at the beginning of June and fell on a curb and fractured her L ribs 4-5 and ribs 8. She went to UC at [facility] and was given [place] and some flexeril. She was advised to take tylenol and use heat. Here today for follow up, 1 month out. She continues with moderate pain. She notes that she continues with severe muscle spasms around her back. She notes that the pain is better than it was initially, but she is still concerned because she is not doing as well as she feels like she should be doing now. She notes that the provider she saw at [facility] were concerned about compression fractures in her back, especially due to her osteoporosis. Breathing is doing well. No SOB, no wheezing, no chest pain. She also is having pain in her L foot. She feels like she has a bone spur and would like to see podiatry. This has been going on for the last few weeks. Not getting better. Worse with walking and standing. Nothing makes them better. No other concerns or complaints at this time.   Relevant past medical, surgical, family and social history reviewed and updated as indicated. Interim medical history since our last visit reviewed. Allergies and medications reviewed and updated.  Review of Systems  Constitutional: Negative.   Respiratory: Negative.   Cardiovascular: Positive for chest pain. Negative for palpitations and leg swelling.  Gastrointestinal: Negative.   Musculoskeletal: Positive for back pain and myalgias. Negative for arthralgias, gait problem, joint swelling, neck pain and neck stiffness.  Skin: Negative.   Neurological: Negative.    Psychiatric/Behavioral: Negative.     Per HPI unless specifically indicated above     Objective:    BP 130/88   Pulse 71   Temp 98.1 F (36.7 C) (Oral)   Ht 5\' 8"  (1.727 m)   Wt 190 lb (86.2 kg)   SpO2 98%   BMI 28.89 kg/m   Wt Readings from Last 3 Encounters:  03/29/19 190 lb (86.2 kg)  11/30/18 192 lb 9.6 oz (87.4 kg)  09/18/18 194 lb (88 kg)    Physical Exam Vitals signs and nursing note reviewed.  Constitutional:      General: She is not in acute distress.    Appearance: Normal appearance. She is not ill-appearing, toxic-appearing or diaphoretic.  HENT:     Head: Normocephalic and atraumatic.     Right Ear: External ear normal.     Left Ear: External ear normal.     Nose: Nose normal.     Mouth/Throat:     Mouth: Mucous membranes are moist.     Pharynx: Oropharynx is clear.  Eyes:     General: No scleral icterus.       Right eye: No discharge.        Left eye: No discharge.     Extraocular Movements: Extraocular movements intact.     Conjunctiva/sclera: Conjunctivae normal.     Pupils: Pupils are equal, round,  and reactive to light.  Neck:     Musculoskeletal: Normal range of motion and neck supple.  Cardiovascular:     Rate and Rhythm: Normal rate and regular rhythm.     Pulses: Normal pulses.     Heart sounds: Normal heart sounds. No murmur. No friction rub. No gallop.   Pulmonary:     Effort: Pulmonary effort is normal. No respiratory distress.     Breath sounds: Normal breath sounds. No stridor. No wheezing, rhonchi or rales.  Chest:     Chest wall: No tenderness.  Musculoskeletal:        General: Tenderness (spinous process T5-L2) present.  Skin:    General: Skin is warm and dry.     Capillary Refill: Capillary refill takes less than 2 seconds.     Coloration: Skin is not jaundiced or pale.     Findings: No bruising, erythema, lesion or rash.  Neurological:     General: No focal deficit present.     Mental Status: She is alert and oriented to  person, place, and time. Mental status is at baseline.  Psychiatric:        Mood and Affect: Mood normal.        Behavior: Behavior normal.        Thought Content: Thought content normal.        Judgment: Judgment normal.     Results for orders placed or performed in visit on 03/30/18  Bayer DCA Hb A1c Waived  Result Value Ref Range   HB A1C (BAYER DCA - WAIVED) 5.2 <7.0 %  CBC with Differential/Platelet  Result Value Ref Range   WBC 5.7 3.4 - 10.8 x10E3/uL   RBC 4.45 3.77 - 5.28 x10E6/uL   Hemoglobin 11.8 11.1 - 15.9 g/dL   Hematocrit 35.5 34.0 - 46.6 %   MCV 80 79 - 97 fL   MCH 26.5 (L) 26.6 - 33.0 pg   MCHC 33.2 31.5 - 35.7 g/dL   RDW 14.0 12.3 - 15.4 %   Platelets 293 150 - 450 x10E3/uL   Neutrophils 54 Not Estab. %   Lymphs 34 Not Estab. %   Monocytes 9 Not Estab. %   Eos 3 Not Estab. %   Basos 0 Not Estab. %   Neutrophils Absolute 3.0 1.4 - 7.0 x10E3/uL   Lymphocytes Absolute 1.9 0.7 - 3.1 x10E3/uL   Monocytes Absolute 0.5 0.1 - 0.9 x10E3/uL   EOS (ABSOLUTE) 0.2 0.0 - 0.4 x10E3/uL   Basophils Absolute 0.0 0.0 - 0.2 x10E3/uL   Immature Granulocytes 0 Not Estab. %   Immature Grans (Abs) 0.0 0.0 - 0.1 x10E3/uL  Comprehensive metabolic panel  Result Value Ref Range   Glucose 74 65 - 99 mg/dL   BUN 11 6 - 24 mg/dL   Creatinine, Ser 0.89 0.57 - 1.00 mg/dL   GFR calc non Af Amer 79 >59 mL/min/1.73   GFR calc Af Amer 91 >59 mL/min/1.73   BUN/Creatinine Ratio 12 9 - 23   Sodium 142 134 - 144 mmol/L   Potassium 3.7 3.5 - 5.2 mmol/L   Chloride 102 96 - 106 mmol/L   CO2 26 20 - 29 mmol/L   Calcium 9.5 8.7 - 10.2 mg/dL   Total Protein 7.4 6.0 - 8.5 g/dL   Albumin 4.4 3.5 - 5.5 g/dL   Globulin, Total 3.0 1.5 - 4.5 g/dL   Albumin/Globulin Ratio 1.5 1.2 - 2.2   Bilirubin Total 0.3 0.0 - 1.2 mg/dL   Alkaline Phosphatase  94 39 - 117 IU/L   AST 13 0 - 40 IU/L   ALT 9 0 - 32 IU/L  VITAMIN D 25 Hydroxy (Vit-D Deficiency, Fractures)  Result Value Ref Range   Vit D,  25-Hydroxy 18.6 (L) 30.0 - 100.0 ng/mL  TSH  Result Value Ref Range   TSH 1.330 0.450 - 4.500 uIU/mL  Lipid Panel w/o Chol/HDL Ratio  Result Value Ref Range   Cholesterol, Total 211 (H) 100 - 199 mg/dL   Triglycerides 157 (H) 0 - 149 mg/dL   HDL 48 >39 mg/dL   VLDL Cholesterol Cal 31 5 - 40 mg/dL   LDL Calculated 132 (H) 0 - 99 mg/dL  Iron and TIBC  Result Value Ref Range   Total Iron Binding Capacity 437 250 - 450 ug/dL   UIBC 383 131 - 425 ug/dL   Iron 54 27 - 159 ug/dL   Iron Saturation 12 (L) 15 - 55 %  Ferritin  Result Value Ref Range   Ferritin 16 15 - 150 ng/mL  Cortisol  Result Value Ref Range   Cortisol 10.1 ug/dL      Assessment & Plan:   Problem List Items Addressed This Visit      Musculoskeletal and Integument   Osteoporosis    Will obtain x-rays to look for compression fracture. Await results. Treat as needed.       Relevant Orders   DG Lumbar Spine Complete   DG Thoracic Spine W/Swimmers    Other Visit Diagnoses    Acute midline thoracic back pain    -  Primary   Concern for compression fractures- will get x-rays. Await results. Call with any concerns.    Relevant Orders   DG Lumbar Spine Complete   DG Thoracic Spine W/Swimmers   Fall, subsequent encounter       Mechanical fall with rib fractures. Will check x-rays. Await results.   Relevant Orders   DG Lumbar Spine Complete   DG Thoracic Spine W/Swimmers   Left foot pain       Referral to podiatry made today. Await results. Call with any concerns.    Relevant Orders   Ambulatory referral to Podiatry   Closed fracture of one rib of left side with routine healing, subsequent encounter       Improving. Can use voltaren as able. Call with any concerns.        Follow up plan: Return if symptoms worsen or fail to improve.

## 2019-03-29 NOTE — Assessment & Plan Note (Signed)
Will obtain x-rays to look for compression fracture. Await results. Treat as needed.

## 2019-04-14 ENCOUNTER — Other Ambulatory Visit: Payer: Self-pay

## 2019-04-14 ENCOUNTER — Ambulatory Visit: Payer: BLUE CROSS/BLUE SHIELD | Attending: Family Medicine

## 2019-04-14 DIAGNOSIS — M546 Pain in thoracic spine: Secondary | ICD-10-CM | POA: Diagnosis not present

## 2019-04-14 DIAGNOSIS — R293 Abnormal posture: Secondary | ICD-10-CM | POA: Diagnosis present

## 2019-04-14 DIAGNOSIS — M5414 Radiculopathy, thoracic region: Secondary | ICD-10-CM | POA: Diagnosis present
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
## 2019-04-19 ENCOUNTER — Ambulatory Visit: Payer: BLUE CROSS/BLUE SHIELD

## 2019-04-22 ENCOUNTER — Ambulatory Visit: Payer: BLUE CROSS/BLUE SHIELD

## 2019-04-26 ENCOUNTER — Ambulatory Visit: Payer: BLUE CROSS/BLUE SHIELD

## 2019-05-03 DIAGNOSIS — S42213A Unspecified displaced fracture of surgical neck of unspecified humerus, initial encounter for closed fracture: Secondary | ICD-10-CM

## 2019-05-03 DIAGNOSIS — S42253A Displaced fracture of greater tuberosity of unspecified humerus, initial encounter for closed fracture: Secondary | ICD-10-CM | POA: Insufficient documentation

## 2019-05-03 HISTORY — DX: Displaced fracture of greater tuberosity of unspecified humerus, initial encounter for closed fracture: S42.253A

## 2019-05-03 HISTORY — DX: Unspecified displaced fracture of surgical neck of unspecified humerus, initial encounter for closed fracture: S42.213A

## 2019-05-04 ENCOUNTER — Ambulatory Visit (INDEPENDENT_AMBULATORY_CARE_PROVIDER_SITE_OTHER): Payer: BLUE CROSS/BLUE SHIELD | Admitting: Podiatry

## 2019-05-04 ENCOUNTER — Other Ambulatory Visit: Payer: Self-pay

## 2019-05-04 ENCOUNTER — Encounter: Payer: Self-pay | Admitting: Podiatry

## 2019-05-04 ENCOUNTER — Ambulatory Visit (INDEPENDENT_AMBULATORY_CARE_PROVIDER_SITE_OTHER): Payer: BLUE CROSS/BLUE SHIELD

## 2019-05-04 VITALS — Temp 98.5°F

## 2019-05-04 DIAGNOSIS — M722 Plantar fascial fibromatosis: Secondary | ICD-10-CM | POA: Diagnosis not present

## 2019-05-04 MED ORDER — METHYLPREDNISOLONE 4 MG PO TBPK: ORAL_TABLET | ORAL | 0 refills | Status: DC
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
## 2019-05-12 ENCOUNTER — Encounter: Payer: Self-pay | Admitting: Family Medicine

## 2019-05-17 ENCOUNTER — Ambulatory Visit (INDEPENDENT_AMBULATORY_CARE_PROVIDER_SITE_OTHER): Payer: BLUE CROSS/BLUE SHIELD | Admitting: Family Medicine

## 2019-05-17 ENCOUNTER — Other Ambulatory Visit: Payer: Self-pay

## 2019-05-17 ENCOUNTER — Ambulatory Visit
Admission: RE | Admit: 2019-05-17 | Discharge: 2019-05-17 | Disposition: A | Discharge status: Home / Self Care | Payer: BLUE CROSS/BLUE SHIELD | Source: Ambulatory Visit | Attending: Family Medicine | Admitting: Family Medicine

## 2019-05-17 ENCOUNTER — Encounter: Payer: Self-pay | Admitting: Family Medicine

## 2019-05-17 VITALS — BP 140/87 | HR 71 | Temp 98.6°F | Ht 67.0 in | Wt 186.0 lb

## 2019-05-17 DIAGNOSIS — G43009 Migraine without aura, not intractable, without status migrainosus: Secondary | ICD-10-CM

## 2019-05-17 DIAGNOSIS — M25562 Pain in left knee: Secondary | ICD-10-CM

## 2019-05-17 DIAGNOSIS — F419 Anxiety disorder, unspecified: Secondary | ICD-10-CM | POA: Diagnosis not present

## 2019-05-17 MED ORDER — BUSPIRONE HCL 5 MG PO TABS: 5.0000 mg | ORAL_TABLET | Freq: Three times a day (TID) | ORAL | 6 refills | Status: DC

## 2019-05-17 MED ORDER — ESCITALOPRAM OXALATE 10 MG PO TABS: ORAL_TABLET | ORAL | 2 refills | Status: DC

## 2019-05-17 NOTE — Assessment & Plan Note (Signed)
Taken off her lexapro and her buspar for unclear reasons. Not doing well. Will restart them. Call with any concerns. Continue to monitor. Recheck 1 month.

## 2019-05-17 NOTE — Progress Notes (Signed)
BP 140/87   Pulse 71   Temp 98.6 F (37 C) (Oral)   Ht 5\' 7"  (1.702 m)   Wt 186 lb (84.4 kg)   SpO2 99%   BMI 29.13 kg/m    Subjective:    Patient ID: Real Cons, female    DOB: 1970/11/21, 48 y.o.   MRN: [id number]  HPI: Tina Harvey is a 48 y.o. female  Chief Complaint  Patient presents with  . Anxiety    buspar concerns  . Fall    pt states she fell last Friday and hit her left knee on the floor   ANXIETY/STRESS- her neurologist took her off her buspar Duration:exacerbated Anxious mood: yes  Excessive worrying: yes Irritability: yes  Sweating: no Nausea: no Palpitations:no Hyperventilation: no Panic attacks: no Agoraphobia: no  Obscessions/compulsions: no Depressed mood: yes Depression screen [facility] 2/9 03/30/2018 02/12/2017 06/19/2015  Decreased Interest 0 1 0  Down, Depressed, Hopeless 0 1 0  PHQ - 2 Score 0 2 0  Altered sleeping 3 - -  Tired, decreased energy 3 - -  Change in appetite 0 - -  Feeling bad or failure about yourself  0 - -  Trouble concentrating 2 - -  Moving slowly or fidgety/restless 0 - -  Suicidal thoughts 0 - -  PHQ-9 Score 8 - -  Difficult doing work/chores Somewhat difficult - -   GAD 7 : Generalized Anxiety Score 05/17/2019 03/30/2018  Nervous, Anxious, on Edge 3 2  Control/stop worrying 2 0  Worry too much - different things 2 0  Trouble relaxing 2 2  Restless 1 0  Easily annoyed or irritable 2 2  Afraid - awful might happen 0 0  Total GAD 7 Score 12 6  Anxiety Difficulty Very difficult Somewhat difficult   Anhedonia: no Weight changes: no Insomnia: no   Hypersomnia: no Fatigue/loss of energy: yes Feelings of worthlessness: no Feelings of guilt: no Impaired concentration/indecisiveness: no Suicidal ideations: no  Crying spells: no Recent Stressors/Life Changes: yes   Relationship problems: no   Family stress: no     Financial stress: no    Job stress: no    Recent death/loss: no  Relevant past medical, surgical,  family and social history reviewed and updated as indicated. Interim medical history since our last visit reviewed. Allergies and medications reviewed and updated.  Review of Systems  Constitutional: Negative.   Respiratory: Negative.   Cardiovascular: Negative.   Musculoskeletal: Negative.   Psychiatric/Behavioral: Negative.     Per HPI unless specifically indicated above     Objective:    BP 140/87   Pulse 71   Temp 98.6 F (37 C) (Oral)   Ht 5\' 7"  (1.702 m)   Wt 186 lb (84.4 kg)   SpO2 99%   BMI 29.13 kg/m   Wt Readings from Last 3 Encounters:  05/17/19 186 lb (84.4 kg)  03/29/19 190 lb (86.2 kg)  11/30/18 192 lb 9.6 oz (87.4 kg)    Physical Exam Vitals signs and nursing note reviewed.  Constitutional:      General: She is not in acute distress.    Appearance: Normal appearance. She is not ill-appearing, toxic-appearing or diaphoretic.  HENT:     Head: Normocephalic and atraumatic.     Right Ear: External ear normal.     Left Ear: External ear normal.     Nose: Nose normal.     Mouth/Throat:     Mouth: Mucous membranes are moist.  Pharynx: Oropharynx is clear.  Eyes:     General: No scleral icterus.       Right eye: No discharge.        Left eye: No discharge.     Extraocular Movements: Extraocular movements intact.     Conjunctiva/sclera: Conjunctivae normal.     Pupils: Pupils are equal, round, and reactive to light.  Neck:     Musculoskeletal: Normal range of motion and neck supple.  Cardiovascular:     Rate and Rhythm: Normal rate and regular rhythm.     Pulses: Normal pulses.     Heart sounds: Normal heart sounds. No murmur. No friction rub. No gallop.   Pulmonary:     Effort: Pulmonary effort is normal. No respiratory distress.     Breath sounds: Normal breath sounds. No stridor. No wheezing, rhonchi or rales.  Chest:     Chest wall: No tenderness.  Musculoskeletal: Normal range of motion.  Skin:    General: Skin is warm and dry.      Capillary Refill: Capillary refill takes less than 2 seconds.     Coloration: Skin is not jaundiced or pale.     Findings: No bruising, erythema, lesion or rash.  Neurological:     General: No focal deficit present.     Mental Status: She is alert and oriented to person, place, and time. Mental status is at baseline.  Psychiatric:        Mood and Affect: Mood normal.        Behavior: Behavior normal.        Thought Content: Thought content normal.        Judgment: Judgment normal.     Results for orders placed or performed in visit on 03/30/18  Bayer DCA Hb A1c Waived  Result Value Ref Range   HB A1C (BAYER DCA - WAIVED) 5.2 <7.0 %  CBC with Differential/Platelet  Result Value Ref Range   WBC 5.7 3.4 - 10.8 x10E3/uL   RBC 4.45 3.77 - 5.28 x10E6/uL   Hemoglobin 11.8 11.1 - 15.9 g/dL   Hematocrit 35.5 34.0 - 46.6 %   MCV 80 79 - 97 fL   MCH 26.5 (L) 26.6 - 33.0 pg   MCHC 33.2 31.5 - 35.7 g/dL   RDW 14.0 12.3 - 15.4 %   Platelets 293 150 - 450 x10E3/uL   Neutrophils 54 Not Estab. %   Lymphs 34 Not Estab. %   Monocytes 9 Not Estab. %   Eos 3 Not Estab. %   Basos 0 Not Estab. %   Neutrophils Absolute 3.0 1.4 - 7.0 x10E3/uL   Lymphocytes Absolute 1.9 0.7 - 3.1 x10E3/uL   Monocytes Absolute 0.5 0.1 - 0.9 x10E3/uL   EOS (ABSOLUTE) 0.2 0.0 - 0.4 x10E3/uL   Basophils Absolute 0.0 0.0 - 0.2 x10E3/uL   Immature Granulocytes 0 Not Estab. %   Immature Grans (Abs) 0.0 0.0 - 0.1 x10E3/uL  Comprehensive metabolic panel  Result Value Ref Range   Glucose 74 65 - 99 mg/dL   BUN 11 6 - 24 mg/dL   Creatinine, Ser 0.89 0.57 - 1.00 mg/dL   GFR calc non Af Amer 79 >59 mL/min/1.73   GFR calc Af Amer 91 >59 mL/min/1.73   BUN/Creatinine Ratio 12 9 - 23   Sodium 142 134 - 144 mmol/L   Potassium 3.7 3.5 - 5.2 mmol/L   Chloride 102 96 - 106 mmol/L   CO2 26 20 - 29 mmol/L   Calcium 9.5  8.7 - 10.2 mg/dL   Total Protein 7.4 6.0 - 8.5 g/dL   Albumin 4.4 3.5 - 5.5 g/dL   Globulin, Total 3.0 1.5 -  4.5 g/dL   Albumin/Globulin Ratio 1.5 1.2 - 2.2   Bilirubin Total 0.3 0.0 - 1.2 mg/dL   Alkaline Phosphatase 94 39 - 117 IU/L   AST 13 0 - 40 IU/L   ALT 9 0 - 32 IU/L  VITAMIN D 25 Hydroxy (Vit-D Deficiency, Fractures)  Result Value Ref Range   Vit D, 25-Hydroxy 18.6 (L) 30.0 - 100.0 ng/mL  TSH  Result Value Ref Range   TSH 1.330 0.450 - 4.500 uIU/mL  Lipid Panel w/o Chol/HDL Ratio  Result Value Ref Range   Cholesterol, Total 211 (H) 100 - 199 mg/dL   Triglycerides 157 (H) 0 - 149 mg/dL   HDL 48 >39 mg/dL   VLDL Cholesterol Cal 31 5 - 40 mg/dL   LDL Calculated 132 (H) 0 - 99 mg/dL  Iron and TIBC  Result Value Ref Range   Total Iron Binding Capacity 437 250 - 450 ug/dL   UIBC 383 131 - 425 ug/dL   Iron 54 27 - 159 ug/dL   Iron Saturation 12 (L) 15 - 55 %  Ferritin  Result Value Ref Range   Ferritin 16 15 - 150 ng/mL  Cortisol  Result Value Ref Range   Cortisol 10.1 ug/dL      Assessment & Plan:   Problem List Items Addressed This Visit      Cardiovascular and Mediastinum   Migraine without aura and without status migrainosus, not intractable   Relevant Medications   escitalopram (LEXAPRO) 10 MG tablet   Other Relevant Orders   Referral to Chronic Care Management Services     Other   Anxiety - Primary    Taken off her lexapro and her buspar for unclear reasons. Not doing well. Will restart them. Call with any concerns. Continue to monitor. Recheck 1 month.       Relevant Medications   busPIRone (BUSPAR) 5 MG tablet   escitalopram (LEXAPRO) 10 MG tablet    Other Visit Diagnoses    Acute pain of left knee       Likely bruised. Will get x-ray. Await results. Continue ice/heat. Call with any concerns.    Relevant Orders   DG Knee Complete 4 Views Left       Follow up plan: Return in about 4 weeks (around 06/14/2019) for follow up mood.

## 2019-05-17 NOTE — Patient Instructions (Signed)
Acute Knee Pain, Adult Many things can cause knee pain. Sometimes, knee pain is sudden (acute) and may be caused by damage, swelling, or irritation of the muscles and tissues that support your knee. The pain often goes away on its own with time and rest. If the pain does not go away, tests may be done to find out what is causing the pain. Follow these instructions at home: Pay attention to any changes in your symptoms. Take these actions to relieve your pain. If you have a knee sleeve or brace:   Wear the sleeve or brace as told by your doctor. Remove it only as told by your doctor.  Loosen the sleeve or brace if your toes: ? Tingle. ? Become numb. ? Turn cold and blue.  Keep the sleeve or brace clean.  If the sleeve or brace is not waterproof: ? Do not let it get wet. ? Cover it with a watertight covering when you take a bath or shower. Activity  Rest your knee.  Do not do things that cause pain.  Avoid activities where both feet leave the ground at the same time (high-impact activities). Examples are running, jumping rope, and doing jumping jacks.  Work with a physical therapist to make a safe exercise program, as told by your doctor. Managing pain, stiffness, and swelling   If told, put ice on the knee: ? Put ice in a plastic bag. ? Place a towel between your skin and the bag. ? Leave the ice on for 20 minutes, 2-3 times a day.  If told, put pressure (compression) on your injured knee to control swelling, give support, and help with discomfort. Compression may be done with an elastic bandage. General instructions  Take all medicines only as told by your doctor.  Raise (elevate) your knee while you are sitting or lying down. Make sure your knee is higher than your heart.  Sleep with a pillow under your knee.  Do not use any products that contain nicotine or tobacco. These include cigarettes, e-cigarettes, and chewing tobacco. These products may slow down healing. If  you need help quitting, ask your doctor.  If you are overweight, work with your doctor and a food expert (dietitian) to set goals to lose weight. Being overweight can make your knee hurt more.  Keep all follow-up visits as told by your doctor. This is important. Contact a doctor if:  The knee pain does not stop.  The knee pain changes or gets worse.  You have a fever along with knee pain.  Your knee feels warm when you touch it.  Your knee gives out or locks up. Get help right away if:  Your knee swells, and the swelling gets worse.  You cannot move your knee.  You have very bad knee pain. Summary  Many things can cause knee pain. The pain often goes away on its own with time and rest.  Your doctor may do tests to find out the cause of the pain.  Pay attention to any changes in your symptoms. Relieve your pain with rest, medicines, light activity, and use of ice.  Get help right away if you cannot move your knee or your knee pain is very bad. This information is not intended to replace advice given to you by your health care provider. Make sure you discuss any questions you have with your health care provider. Document Released: 12/13/2008 Document Revised: 02/26/2018 Document Reviewed: 02/26/2018 Elsevier Patient Education  2020 [employer].

## 2019-06-01 ENCOUNTER — Encounter: Payer: Self-pay | Admitting: Podiatry

## 2019-06-01 ENCOUNTER — Other Ambulatory Visit: Payer: Self-pay

## 2019-06-01 ENCOUNTER — Ambulatory Visit (INDEPENDENT_AMBULATORY_CARE_PROVIDER_SITE_OTHER): Payer: BLUE CROSS/BLUE SHIELD | Admitting: Podiatry

## 2019-06-01 DIAGNOSIS — M722 Plantar fascial fibromatosis: Secondary | ICD-10-CM
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
## 2019-06-11 ENCOUNTER — Ambulatory Visit: Payer: Self-pay | Admitting: Pharmacist

## 2019-06-11 DIAGNOSIS — G43009 Migraine without aura, not intractable, without status migrainosus: Secondary | ICD-10-CM

## 2019-06-11 DIAGNOSIS — M8000XD Age-related osteoporosis with current pathological fracture, unspecified site, subsequent encounter for fracture with routine healing: Secondary | ICD-10-CM

## 2019-06-11 NOTE — Chronic Care Management (AMB) (Signed)
Chronic Care Management   Follow Up Note   06/11/2019 Name: Tina Harvey MRN: [id number] DOB: Nov 16, 1970  Referred by: Valerie Roys, DO Reason for referral : Chronic Care Management (Medication Management)   Tina Harvey is a 48 y.o. year old female who is a primary care patient of Valerie Roys, DO. The CCM team was consulted for assistance with chronic disease management and care coordination needs.    Contacted patient for medication management review.   Review of patient status, including review of consultants reports, relevant laboratory and other test results, and collaboration with appropriate care team members and the patient's provider was performed as part of comprehensive patient evaluation and provision of chronic care management services.    SDOH (Social Determinants of Health) screening performed today: Financial Strain . See Care Plan for related entries.   Outpatient Encounter Medications as of 06/11/2019  Medication Sig  . busPIRone (BUSPAR) 5 MG tablet Take 1 tablet (5 mg total) by mouth 3 (three) times daily.  . Calcium Citrate (CITRACAL PO) Take 1 tablet by mouth.  . cyanocobalamin (,VITAMIN B-12,) 1000 MCG/ML injection INJECT 1 ML INTO THE MUSCLE EVERY 14 DAYS  . escitalopram (LEXAPRO) 10 MG tablet 1/2 tab daily for 1 week, then increase to 1 tab qHS after that  . Galcanezumab-gnlm (EMGALITY) 120 MG/ML SOAJ Inject 1 pen into the skin every 30 (thirty) days.  Marland Kitchen ibandronate (BONIVA) 150 MG tablet Take 1 tablet (150 mg total) by mouth every 30 (thirty) days. Take in the morning with a full glass of water, on an empty stomach, and do not take anything else by mouth or lie down for the next 30 min.  . Multiple Vitamin (MULTIVITAMIN) capsule Take 1 capsule by mouth 2 (two) times daily.   . ondansetron (ZOFRAN ODT) 4 MG disintegrating tablet Take 1 tablet (4 mg total) by mouth every 8 (eight) hours as needed for nausea or vomiting.  . pantoprazole (PROTONIX) 40 MG  tablet Take 1 tablet (40 mg total) by mouth daily.  . sucralfate (CARAFATE) 1 g tablet Take 1 tablet (1 g total) by mouth 4 (four) times daily.  . SYRINGE-NEEDLE, DISP, 3 ML (B-D 3CC LUER-LOK SYR 25GX1") 25G X 1" 3 ML MISC USE WITH B12 EVERY 14 DAYS  . Syringe/Needle, Disp, 25G X 1" 1 ML MISC Patient is to use the needle and syringe with cyanocobalamin 1047mcg per 69ml every 14 days  . Vitamin D, Ergocalciferol, (DRISDOL) 1.25 MG (50000 UT) CAPS capsule Take 1 capsule by mouth once a week  . docusate sodium (COLACE) 100 MG capsule Take 1 capsule (100 mg total) by mouth 2 (two) times daily. (Patient not taking: Reported on 06/11/2019)  . EPINEPHrine 0.3 mg/0.3 mL IJ SOAJ injection Inject 0.3 mLs (0.3 mg total) into the muscle once for 1 dose.   No facility-administered encounter medications on file as of 06/11/2019.      Goals Addressed            This Visit's Progress     Patient Stated   . "I have a problem with migraines" (pt-stated)       Current Barriers:  . Polypharmacy; complex patient with multiple comorbidities including migraines, cluster headaches, and hx medication overuse headache. Currently managed by [facility] neurology o Tx Emgality for ppx, escitalopram as adjunct PPX. Uses APAP for acute relief o Hx overuse of tizanidine  . Wonders about use of a migraine-specific abortive agent  . Concerned about cost of Emgality  when her coupon card expires in December   Pharmacist Clinical Goal(s):  Marland Kitchen Over the next 90 days, patient will work with PharmD and provider towards optimized medication management  Interventions: . Comprehensive medication review performed; medication list updated in electronic medical record . Explained that coupon cards can be renewed. The number to contact in December when it is time for patient to re-enroll is found on [employer] or the coupon card itself; patient verbalized understanding.  . Consider addition of triptan agent for abortive therapy, with  instruction regarding concerns with overuse headache. Would avoid newer CGRP and ditan abortive agents for now, until more data for combination with CGRP antagonist preventables  Patient Self Care Activities:  . Patient will take medications as prescribed  Initial goal documentation       Other   . "I have osteoporosis"       Current Barriers:  . Complex patient with multiple comorbidities including osteoporosis; dx by DEXA scan 08/2018 w/ spine t score of -2.5; hx humerus fracture in 2014 and rib fracture earlier this year; unsure if these were fragility fractures from standing height . Reports she takes citracal gummies and vitamin D3 PO OTC, as well as ibandronate 150 mg Qmonth .   Pharmacist Clinical Goal(s):  Marland Kitchen Over the next 90 days, patient will work with PharmD and provider towards optimized medication management  Interventions: . Comprehensive medication review performed; medication list updated in electronic medical record . As ibandronate only has data for preventing vertebral fractures, not hip, recommend switch to alendronate or risedronate. If fragility fractures, may consider patient high risk due to young age and consider switch to anabolic therapy (Evenity x 1 year, then bisphosphonate or Forteo/Tymlos x 2 years, then bisphosphonate) . Discussed continuing calcium citrate therapy vs ever using carbonate tx d/t concurrent acid reducing therapy. Discussed goal daily calcium intake total of 1200 mg, discussed patient dietary sources, and that only ~500 mg can be absorbed at once. Patient verbalized undertstanding.    Patient Self Care Activities:  . Patient will take medications as prescribed  Initial goal documentation         Plan:  - Will collaborate with Dr. [name] on the above recommendations. Patient has follow up with her later this week - Will outreach patient in the next 5-6 weeks for continued medication management support  Catie Darnelle Maffucci, PharmD Clinical  Pharmacist [place] [phone]

## 2019-06-11 NOTE — Patient Instructions (Signed)
Visit Information  Goals Addressed            This Visit's Progress     Patient Stated   . "I have a problem with migraines" (pt-stated)       Current Barriers:  . Polypharmacy; complex patient with multiple comorbidities including migraines, cluster headaches, and hx medication overuse headache. Currently managed by [facility] neurology o Tx Emgality for ppx, escitalopram as adjunct PPX. Uses APAP for acute relief o Hx overuse of tizanidine  . Wonders about use of a migraine-specific abortive agent  . Concerned about cost of Emgality when her coupon card expires in December   Pharmacist Clinical Goal(s):  Marland Kitchen Over the next 90 days, patient will work with PharmD and provider towards optimized medication management  Interventions: . Comprehensive medication review performed; medication list updated in electronic medical record . Explained that coupon cards can be renewed. The number to contact in December when it is time for patient to re-enroll is found on [employer] or the coupon card itself; patient verbalized understanding.  . Consider addition of triptan agent for abortive therapy, with instruction regarding concerns with overuse headache. Would avoid newer CGRP and ditan abortive agents for now, until more data for combination with CGRP antagonist preventables  Patient Self Care Activities:  . Patient will take medications as prescribed  Initial goal documentation       Other   . "I have osteoporosis"       Current Barriers:  . Complex patient with multiple comorbidities including osteoporosis; dx by DEXA scan 08/2018 w/ spine t score of -2.5; hx humerus fracture in 2014 and rib fracture earlier this year; unsure if these were fragility fractures from standing height . Reports she takes citracal gummies and vitamin D3 PO OTC, as well as ibandronate 150 mg Qmonth .   Pharmacist Clinical Goal(s):  Marland Kitchen Over the next 90 days, patient will work with PharmD and provider towards  optimized medication management  Interventions: . Comprehensive medication review performed; medication list updated in electronic medical record . As ibandronate only has data for preventing vertebral fractures, not hip, recommend switch to alendronate or risedronate. If fragility fractures, may consider patient high risk due to young age and consider switch to anabolic therapy (Evenity x 1 year, then bisphosphonate or Forteo/Tymlos x 2 years, then bisphosphonate) . Discussed continuing calcium citrate therapy vs ever using carbonate tx d/t concurrent acid reducing therapy. Discussed goal daily calcium intake total of 1200 mg, discussed patient dietary sources, and that only ~500 mg can be absorbed at once. Patient verbalized undertstanding.    Patient Self Care Activities:  . Patient will take medications as prescribed  Initial goal documentation        The patient verbalized understanding of instructions provided today and declined a print copy of patient instruction materials.   Plan:  - Will collaborate with Dr. [name] on the above recommendations. Patient has follow up with her later this week - Will outreach patient in the next 5-6 weeks for continued medication management support  Catie Darnelle Maffucci, PharmD Clinical Pharmacist [place] [phone]

## 2019-06-16 ENCOUNTER — Other Ambulatory Visit: Payer: BLUE CROSS/BLUE SHIELD | Admitting: Orthotics

## 2019-06-21 ENCOUNTER — Ambulatory Visit: Payer: BLUE CROSS/BLUE SHIELD | Admitting: Family Medicine

## 2019-06-22 ENCOUNTER — Ambulatory Visit (INDEPENDENT_AMBULATORY_CARE_PROVIDER_SITE_OTHER): Payer: BLUE CROSS/BLUE SHIELD | Admitting: Family Medicine

## 2019-06-22 ENCOUNTER — Other Ambulatory Visit: Payer: Self-pay

## 2019-06-22 ENCOUNTER — Encounter: Payer: Self-pay | Admitting: Family Medicine

## 2019-06-22 DIAGNOSIS — F419 Anxiety disorder, unspecified: Secondary | ICD-10-CM | POA: Diagnosis not present

## 2019-06-22 DIAGNOSIS — E538 Deficiency of other specified B group vitamins: Secondary | ICD-10-CM

## 2019-06-22 DIAGNOSIS — G43009 Migraine without aura, not intractable, without status migrainosus: Secondary | ICD-10-CM

## 2019-06-22 MED ORDER — "SYRINGE/NEEDLE (DISP) 25G X 1"" 1 ML MISC": 12 refills | Status: DC

## 2019-06-22 MED ORDER — ESCITALOPRAM OXALATE 10 MG PO TABS: 10.0000 mg | ORAL_TABLET | Freq: Every day | ORAL | 1 refills | Status: DC

## 2019-06-22 MED ORDER — "BD LUER-LOK SYRINGE 25G X 1"" 3 ML MISC": 0 refills | Status: DC

## 2019-06-22 MED ORDER — EMGALITY 120 MG/ML ~~LOC~~ SOAJ: 1.0000 "pen " | SUBCUTANEOUS | 3 refills | Status: DC

## 2019-06-22 MED ORDER — SUMATRIPTAN SUCCINATE 100 MG PO TABS: 100.0000 mg | ORAL_TABLET | ORAL | 12 refills | Status: DC | PRN

## 2019-06-22 MED ORDER — IBANDRONATE SODIUM 150 MG PO TABS: 150.0000 mg | ORAL_TABLET | ORAL | 0 refills | Status: DC

## 2019-06-22 MED ORDER — PANTOPRAZOLE SODIUM 40 MG PO TBEC: 40.0000 mg | DELAYED_RELEASE_TABLET | Freq: Every day | ORAL | 1 refills | Status: DC

## 2019-06-22 NOTE — Progress Notes (Addendum)
BP 128/85   Pulse 70   Temp 98.5 F (36.9 C) (Oral)   Ht 5\' 7"  (1.702 m)   Wt 188 lb (85.3 kg)   SpO2 99%   BMI 29.44 kg/m    Subjective:    Patient ID: Real Cons, female    DOB: [birth year], 48 y.o.   MRN: [id number]  HPI: Tina Harvey is a 48 y.o. female  Chief Complaint  Patient presents with  . Anxiety   ANXIETY/DEPRESSION Duration:better Anxious mood: yes  Excessive worrying: yes Irritability: yes  Sweating: no Nausea: no Palpitations:no Hyperventilation: no Panic attacks: no Agoraphobia: no  Obscessions/compulsions: no Depressed mood: yes Depression screen [facility] 2/9 06/22/2019 03/30/2018 02/12/2017 06/19/2015  Decreased Interest 0 0 1 0  Down, Depressed, Hopeless 1 0 1 0  PHQ - 2 Score 1 0 2 0  Altered sleeping 1 3 - -  Tired, decreased energy 1 3 - -  Change in appetite 0 0 - -  Feeling bad or failure about yourself  0 0 - -  Trouble concentrating 0 2 - -  Moving slowly or fidgety/restless 0 0 - -  Suicidal thoughts 0 0 - -  PHQ-9 Score 3 8 - -  Difficult doing work/chores Somewhat difficult Somewhat difficult - -   Anhedonia: no Weight changes: no Insomnia: yes hard to fall asleep  Hypersomnia: no Fatigue/loss of energy: yes Feelings of worthlessness: no Feelings of guilt: no Impaired concentration/indecisiveness: no Suicidal ideations: no  Crying spells: no Recent Stressors/Life Changes: yes   Relationship problems: no   Family stress: yes     Financial stress: no    Job stress: no    Recent death/loss: no  Notes that her migraines are getting worse at the end of her month when her emgality is waning. She is generally feeling well with the emgality and she has been feeling well. No other concerns or complaints at this time.   Relevant past medical, surgical, family and social history reviewed and updated as indicated. Interim medical history since our last visit reviewed. Allergies and medications reviewed and updated.  Review of Systems   Constitutional: Negative.   Respiratory: Negative.   Cardiovascular: Negative.   Musculoskeletal: Negative.   Skin: Negative.   Neurological: Positive for headaches. Negative for dizziness, tremors, seizures, syncope, facial asymmetry, speech difficulty, weakness, light-headedness and numbness.  Psychiatric/Behavioral: Positive for agitation, dysphoric mood and sleep disturbance. Negative for behavioral problems, confusion, decreased concentration, hallucinations, self-injury and suicidal ideas. The patient is nervous/anxious. The patient is not hyperactive.     Per HPI unless specifically indicated above     Objective:    BP 128/85   Pulse 70   Temp 98.5 F (36.9 C) (Oral)   Ht 5\' 7"  (1.702 m)   Wt 188 lb (85.3 kg)   SpO2 99%   BMI 29.44 kg/m   Wt Readings from Last 3 Encounters:  06/22/19 188 lb (85.3 kg)  05/17/19 186 lb (84.4 kg)  03/29/19 190 lb (86.2 kg)    Physical Exam Vitals signs and nursing note reviewed.  Constitutional:      General: She is not in acute distress.    Appearance: Normal appearance. She is not ill-appearing, toxic-appearing or diaphoretic.  HENT:     Head: Normocephalic and atraumatic.     Right Ear: External ear normal.     Left Ear: External ear normal.     Nose: Nose normal.     Mouth/Throat:  Mouth: Mucous membranes are moist.     Pharynx: Oropharynx is clear.  Eyes:     General: No scleral icterus.       Right eye: No discharge.        Left eye: No discharge.     Extraocular Movements: Extraocular movements intact.     Conjunctiva/sclera: Conjunctivae normal.     Pupils: Pupils are equal, round, and reactive to light.  Neck:     Musculoskeletal: Normal range of motion and neck supple.  Cardiovascular:     Rate and Rhythm: Normal rate and regular rhythm.     Pulses: Normal pulses.     Heart sounds: Normal heart sounds. No murmur. No friction rub. No gallop.   Pulmonary:     Effort: Pulmonary effort is normal. No respiratory  distress.     Breath sounds: Normal breath sounds. No stridor. No wheezing, rhonchi or rales.  Chest:     Chest wall: No tenderness.  Musculoskeletal: Normal range of motion.  Skin:    General: Skin is warm and dry.     Capillary Refill: Capillary refill takes less than 2 seconds.     Coloration: Skin is not jaundiced or pale.     Findings: No bruising, erythema, lesion or rash.  Neurological:     General: No focal deficit present.     Mental Status: She is alert and oriented to person, place, and time. Mental status is at baseline.  Psychiatric:        Mood and Affect: Mood normal.        Behavior: Behavior normal.        Thought Content: Thought content normal.        Judgment: Judgment normal.     Results for orders placed or performed in visit on 03/30/18  Bayer DCA Hb A1c Waived  Result Value Ref Range   HB A1C (BAYER DCA - WAIVED) 5.2 <7.0 %  CBC with Differential/Platelet  Result Value Ref Range   WBC 5.7 3.4 - 10.8 x10E3/uL   RBC 4.45 3.77 - 5.28 x10E6/uL   Hemoglobin 11.8 11.1 - 15.9 g/dL   Hematocrit 35.5 34.0 - 46.6 %   MCV 80 79 - 97 fL   MCH 26.5 (L) 26.6 - 33.0 pg   MCHC 33.2 31.5 - 35.7 g/dL   RDW 14.0 12.3 - 15.4 %   Platelets 293 150 - 450 x10E3/uL   Neutrophils 54 Not Estab. %   Lymphs 34 Not Estab. %   Monocytes 9 Not Estab. %   Eos 3 Not Estab. %   Basos 0 Not Estab. %   Neutrophils Absolute 3.0 1.4 - 7.0 x10E3/uL   Lymphocytes Absolute 1.9 0.7 - 3.1 x10E3/uL   Monocytes Absolute 0.5 0.1 - 0.9 x10E3/uL   EOS (ABSOLUTE) 0.2 0.0 - 0.4 x10E3/uL   Basophils Absolute 0.0 0.0 - 0.2 x10E3/uL   Immature Granulocytes 0 Not Estab. %   Immature Grans (Abs) 0.0 0.0 - 0.1 x10E3/uL  Comprehensive metabolic panel  Result Value Ref Range   Glucose 74 65 - 99 mg/dL   BUN 11 6 - 24 mg/dL   Creatinine, Ser 0.89 0.57 - 1.00 mg/dL   GFR calc non Af Amer 79 >59 mL/min/1.73   GFR calc Af Amer 91 >59 mL/min/1.73   BUN/Creatinine Ratio 12 9 - 23   Sodium 142 134 -  144 mmol/L   Potassium 3.7 3.5 - 5.2 mmol/L   Chloride 102 96 - 106 mmol/L  CO2 26 20 - 29 mmol/L   Calcium 9.5 8.7 - 10.2 mg/dL   Total Protein 7.4 6.0 - 8.5 g/dL   Albumin 4.4 3.5 - 5.5 g/dL   Globulin, Total 3.0 1.5 - 4.5 g/dL   Albumin/Globulin Ratio 1.5 1.2 - 2.2   Bilirubin Total 0.3 0.0 - 1.2 mg/dL   Alkaline Phosphatase 94 39 - 117 IU/L   AST 13 0 - 40 IU/L   ALT 9 0 - 32 IU/L  VITAMIN D 25 Hydroxy (Vit-D Deficiency, Fractures)  Result Value Ref Range   Vit D, 25-Hydroxy 18.6 (L) 30.0 - 100.0 ng/mL  TSH  Result Value Ref Range   TSH 1.330 0.450 - 4.500 uIU/mL  Lipid Panel w/o Chol/HDL Ratio  Result Value Ref Range   Cholesterol, Total 211 (H) 100 - 199 mg/dL   Triglycerides 157 (H) 0 - 149 mg/dL   HDL 48 >39 mg/dL   VLDL Cholesterol Cal 31 5 - 40 mg/dL   LDL Calculated 132 (H) 0 - 99 mg/dL  Iron and TIBC  Result Value Ref Range   Total Iron Binding Capacity 437 250 - 450 ug/dL   UIBC 383 131 - 425 ug/dL   Iron 54 27 - 159 ug/dL   Iron Saturation 12 (L) 15 - 55 %  Ferritin  Result Value Ref Range   Ferritin 16 15 - 150 ng/mL  Cortisol  Result Value Ref Range   Cortisol 10.1 ug/dL      Assessment & Plan:   Problem List Items Addressed This Visit      Cardiovascular and Mediastinum   Migraine without aura and without status migrainosus, not intractable    Doing well on the emgality, but having issues at the end of the month. Will add in imitrex PRN. Call with any concerns. Continue to monitor.       Relevant Medications   SUMAtriptan (IMITREX) 100 MG tablet   Galcanezumab-gnlm (EMGALITY) 120 MG/ML SOAJ   escitalopram (LEXAPRO) 10 MG tablet     Other   Anxiety    Doing much better. Continue current regimen. Refills given today. Call with any concerns.       Relevant Medications   escitalopram (LEXAPRO) 10 MG tablet    Other Visit Diagnoses    Vitamin B 12 deficiency       Relevant Medications   Syringe/Needle, Disp, 25G X 1" 1 ML MISC        Follow up plan: Return in about 6 months (around 12/20/2019) for Physical.

## 2019-06-22 NOTE — Assessment & Plan Note (Signed)
Doing well on the emgality, but having issues at the end of the month. Will add in imitrex PRN. Call with any concerns. Continue to monitor.

## 2019-06-22 NOTE — Assessment & Plan Note (Signed)
Doing much better. Continue current regimen. Refills given today. Call with any concerns.

## 2019-07-16 ENCOUNTER — Ambulatory Visit: Payer: Self-pay | Admitting: Pharmacist

## 2019-07-16 DIAGNOSIS — G43009 Migraine without aura, not intractable, without status migrainosus: Secondary | ICD-10-CM

## 2019-07-16 NOTE — Chronic Care Management (AMB) (Signed)
Chronic Care Management   Follow Up Note   07/16/2019 Name: Tina Harvey MRN: [id number] DOB: [birth year]  Referred by: Valerie Roys, DO Reason for referral : Chronic Care Management (Medication Management)   Tina Harvey is a 48 y.o. year old female who is a primary care patient of Valerie Roys, DO. The CCM team was consulted for assistance with chronic disease management and care coordination needs.    Contacted patient today for medication management review.   Review of patient status, including review of consultants reports, relevant laboratory and other test results, and collaboration with appropriate care team members and the patient's provider was performed as part of comprehensive patient evaluation and provision of chronic care management services.    SDOH (Social Determinants of Health) screening performed today: Stress. See Care Plan for related entries.   Advanced Directives Status: N See Care Plan and Vynca application for related entries.  Outpatient Encounter Medications as of 07/16/2019  Medication Sig  . busPIRone (BUSPAR) 5 MG tablet Take 1 tablet (5 mg total) by mouth 3 (three) times daily.  . Calcium Citrate (CITRACAL PO) Take 1 tablet by mouth.  . cyanocobalamin (,VITAMIN B-12,) 1000 MCG/ML injection INJECT 1 ML INTO THE MUSCLE EVERY 14 DAYS  . docusate sodium (COLACE) 100 MG capsule Take 1 capsule (100 mg total) by mouth 2 (two) times daily.  Marland Kitchen EPINEPHrine 0.3 mg/0.3 mL IJ SOAJ injection Inject 0.3 mLs (0.3 mg total) into the muscle once for 1 dose.  . escitalopram (LEXAPRO) 10 MG tablet Take 1 tablet (10 mg total) by mouth daily.  . Galcanezumab-gnlm (EMGALITY) 120 MG/ML SOAJ Inject 1 pen into the skin every 30 (thirty) days.  Marland Kitchen ibandronate (BONIVA) 150 MG tablet Take 1 tablet (150 mg total) by mouth every 30 (thirty) days. Take in the morning with a full glass of water, on an empty stomach, and do not take anything else by mouth or lie down for the next  30 min.  . Multiple Vitamin (MULTIVITAMIN) capsule Take 1 capsule by mouth 2 (two) times daily.   . ondansetron (ZOFRAN ODT) 4 MG disintegrating tablet Take 1 tablet (4 mg total) by mouth every 8 (eight) hours as needed for nausea or vomiting.  . pantoprazole (PROTONIX) 40 MG tablet Take 1 tablet (40 mg total) by mouth daily.  . sucralfate (CARAFATE) 1 g tablet Take 1 tablet (1 g total) by mouth 4 (four) times daily.  . SUMAtriptan (IMITREX) 100 MG tablet Take 1 tablet (100 mg total) by mouth every 2 (two) hours as needed for migraine. May repeat in 2 hours if headache persists or recurs.  Marland Kitchen SYRINGE-NEEDLE, DISP, 3 ML (B-D 3CC LUER-LOK SYR 25GX1") 25G X 1" 3 ML MISC USE WITH B12 EVERY 14 DAYS  . Syringe/Needle, Disp, 25G X 1" 1 ML MISC Patient is to use the needle and syringe with cyanocobalamin 1060mcg per 48ml every 14 days  . Vitamin D, Ergocalciferol, (DRISDOL) 1.25 MG (50000 UT) CAPS capsule Take 1 capsule by mouth once a week   No facility-administered encounter medications on file as of 07/16/2019.      Goals Addressed            This Visit's Progress     Patient Stated   . "I have a problem with migraines" (pt-stated)       Current Barriers:  . Polypharmacy; complex patient with multiple comorbidities including migraines, cluster headaches, and hx medication overuse headache; hx bariatric surgery Currently managed by  [facility] neurology o Tx Emgality for ppx, escitalopram as adjunct PPX. Uses APAP for acute relief o Hx overuse of tizanidine  . At last visit, Dr. [name] prescribed sumatriptan as abortive therapy. o Generally well controlled with Emgality therapy, however, does note breakthrough migraines during the last week of the dosing cycle.  o Patient notes that she has significant "stomach pain" about 30 minutes after taking sumatriptan. Notes this never occurred when she was originally prescribed this, however, this was before bariatric surgery. She also wonders about an  anti-nausea medication that would help her sleep.   Pharmacist Clinical Goal(s):  Marland Kitchen Over the next 90 days, patient will work with PharmD and provider towards optimized medication management  Interventions: . Wonder if post-bariatric status is impacting sumatriptan absorption and tolerability. PubMed review does not reveal any data on this area. Consider intranasal sumatriptan instead of oral, to bypass intestinal absorption. Also consider promethazine adjunctively for migraine nausea to provide a sedative effect, instead of ondansetron (older antiemetics preferred in migraine d/t this side effect). Will discuss with Dr. [name]; may require patient to f/u with neurology clinic instead.   Patient Self Care Activities:  . Patient will take medications as prescribed  Please see past updates related to this goal by clicking on the "Past Updates" button in the selected goal          Plan:  - Will collaborate w/ Dr. [name] on the above - Will outreach patient in the next 4-6 weeks for continued medication management support  Catie Darnelle Maffucci, PharmD Clinical Pharmacist [place] [phone]

## 2019-07-16 NOTE — Patient Instructions (Signed)
Visit Information  Goals Addressed            This Visit's Progress     Patient Stated   . "I have a problem with migraines" (pt-stated)       Current Barriers:  . Polypharmacy; complex patient with multiple comorbidities including migraines, cluster headaches, and hx medication overuse headache; hx bariatric surgery Currently managed by [facility] neurology o Tx Emgality for ppx, escitalopram as adjunct PPX. Uses APAP for acute relief o Hx overuse of tizanidine  . At last visit, Dr. [name] prescribed sumatriptan as abortive therapy. o Generally well controlled with Emgality therapy, however, does note breakthrough migraines during the last week of the dosing cycle.  o Patient notes that she has significant "stomach pain" about 30 minutes after taking sumatriptan. Notes this never occurred when she was originally prescribed this, however, this was before bariatric surgery. She also wonders about an anti-nausea medication that would help her sleep.   Pharmacist Clinical Goal(s):  Marland Kitchen Over the next 90 days, patient will work with PharmD and provider towards optimized medication management  Interventions: . Wonder if post-bariatric status is impacting sumatriptan absorption and tolerability. PubMed review does not reveal any data on this area. Consider intranasal sumatriptan instead of oral, to bypass intestinal absorption. Also consider promethazine adjunctively for migraine nausea to provide a sedative effect, instead of ondansetron (older antiemetics preferred in migraine d/t this side effect). Will discuss with Dr. [name]; may require patient to f/u with neurology clinic instead.   Patient Self Care Activities:  . Patient will take medications as prescribed  Please see past updates related to this goal by clicking on the "Past Updates" button in the selected goal         The patient verbalized understanding of instructions provided today and declined a print copy of patient instruction  materials.   Plan:  - Will collaborate w/ Dr. [name] on the above - Will outreach patient in the next 4-6 weeks for continued medication management support  Catie Darnelle Maffucci, PharmD Clinical Pharmacist [place] [phone]

## 2019-07-21 ENCOUNTER — Other Ambulatory Visit: Payer: Self-pay

## 2019-07-21 ENCOUNTER — Ambulatory Visit (INDEPENDENT_AMBULATORY_CARE_PROVIDER_SITE_OTHER): Payer: BLUE CROSS/BLUE SHIELD | Admitting: Orthotics

## 2019-07-21 DIAGNOSIS — M722 Plantar fascial fibromatosis: Secondary | ICD-10-CM | POA: Diagnosis not present

## 2019-07-21 NOTE — Progress Notes (Signed)

## 2019-08-10 ENCOUNTER — Other Ambulatory Visit: Payer: Self-pay | Admitting: Nurse Practitioner

## 2019-08-10 DIAGNOSIS — Z1231 Encounter for screening mammogram for malignant neoplasm of breast: Secondary | ICD-10-CM

## 2019-08-13 ENCOUNTER — Ambulatory Visit: Payer: Self-pay | Admitting: Pharmacist

## 2019-08-13 NOTE — Chronic Care Management (AMB) (Signed)
  Chronic Care Management   Note  08/13/2019 Name: Tina Harvey MRN: [id number] DOB: [birth year]  Tina Harvey is a 48 y.o. year old female who is a primary care patient of Valerie Roys, DO. The CCM team was consulted for assistance with chronic disease management and care coordination needs.    Contacted patient today for medication management review. She notes that unfortunately, her insurance will not be in network with our practice for 2021, so she established with a new PCP earlier this week.   Will cancel her appointment w/ Dr. [name] in 11/2019  Catie Darnelle Maffucci, PharmD Clinical Pharmacist [place] [phone]

## 2019-08-18 ENCOUNTER — Other Ambulatory Visit: Payer: BLUE CROSS/BLUE SHIELD | Admitting: Orthotics

## 2019-09-10 ENCOUNTER — Telehealth: Payer: Self-pay | Admitting: Podiatry

## 2019-09-10 NOTE — Telephone Encounter (Signed)
Pt left message asking about what she would need to pay at her appt Wednesday with Liliane Channel to pick up orthotics.  I returned call and it looks like insurance did not cover them so the cost is 398.00 and if needed we can set up payment plan with a minimum of 100.00 down.Marland KitchenMarland KitchenShe was very Patent attorney.

## 2019-09-15 ENCOUNTER — Other Ambulatory Visit: Payer: Self-pay

## 2019-09-15 ENCOUNTER — Ambulatory Visit: Payer: BLUE CROSS/BLUE SHIELD | Admitting: Orthotics

## 2019-09-15 DIAGNOSIS — M722 Plantar fascial fibromatosis: Secondary | ICD-10-CM

## 2019-09-15 NOTE — Progress Notes (Signed)
Patient came in today to pick up custom made foot orthotics.  The goals were accomplished and the patient reported no dissatisfaction with said orthotics.  Patient was advised of breakin period and how to report any issues. 

## 2019-10-15 ENCOUNTER — Other Ambulatory Visit: Payer: Self-pay

## 2019-10-15 ENCOUNTER — Encounter: Payer: Self-pay | Admitting: Podiatry

## 2019-10-15 ENCOUNTER — Ambulatory Visit (INDEPENDENT_AMBULATORY_CARE_PROVIDER_SITE_OTHER): Payer: BLUE CROSS/BLUE SHIELD | Admitting: Podiatry

## 2019-10-15 DIAGNOSIS — M722 Plantar fascial fibromatosis: Secondary | ICD-10-CM | POA: Diagnosis not present

## 2019-10-15 NOTE — Patient Instructions (Signed)
Pre-Operative Instructions  Congratulations, you have decided to take an important step towards improving your quality of life.  You can be assured that the doctors and staff at [facility] will be with you every step of the way.  Here are some important things you should know:  1. Plan to be at the surgery center/hospital at least 1 (one) hour prior to your scheduled time, unless otherwise directed by the surgical center/hospital staff.  You must have a responsible adult accompany you, remain during the surgery and drive you home.  Make sure you have directions to the surgical center/hospital to ensure you arrive on time. 2. If you are having surgery at [facility] or [facility] hospitals, you will need a copy of your medical history and physical form from your family physician within one month prior to the date of surgery. We will give you a form for your primary physician to complete.  3. We make every effort to accommodate the date you request for surgery.  However, there are times where surgery dates or times have to be moved.  We will contact you as soon as possible if a change in schedule is required.   4. No aspirin/ibuprofen for one week before surgery.  If you are on aspirin, any non-steroidal anti-inflammatory medications (Mobic, Aleve, Ibuprofen) should not be taken seven (7) days prior to your surgery.  You make take Tylenol for pain prior to surgery.  5. Medications - If you are taking daily heart and blood pressure medications, seizure, reflux, allergy, asthma, anxiety, pain or diabetes medications, make sure you notify the surgery center/hospital before the day of surgery so they can tell you which medications you should take or avoid the day of surgery. 6. No food or drink after midnight the night before surgery unless directed otherwise by surgical center/hospital staff. 7. No alcoholic beverages 24-hours prior to surgery.  No smoking 24-hours prior or 24-hours after  surgery. 8. Wear loose pants or shorts. They should be loose enough to fit over bandages, boots, and casts. 9. Don't wear slip-on shoes. Sneakers are preferred. 10. Bring your boot with you to the surgery center/hospital.  Also bring crutches or a walker if your physician has prescribed it for you.  If you do not have this equipment, it will be provided for you after surgery. 11. If you have not been contacted by the surgery center/hospital by the day before your surgery, call to confirm the date and time of your surgery. 12. Leave-time from work may vary depending on the type of surgery you have.  Appropriate arrangements should be made prior to surgery with your employer. 13. Prescriptions will be provided immediately following surgery by your doctor.  Fill these as soon as possible after surgery and take the medication as directed. Pain medications will not be refilled on weekends and must be approved by the doctor. 14. Remove nail polish on the operative foot and avoid getting pedicures prior to surgery. 15. Wash the night before surgery.  The night before surgery wash the foot and leg well with water and the antibacterial soap provided. Be sure to pay special attention to beneath the toenails and in between the toes.  Wash for at least three (3) minutes. Rinse thoroughly with water and dry well with a towel.  Perform this wash unless told not to do so by your physician.  Enclosed: 1 Ice pack (please put in freezer the night before surgery)   1 Hibiclens skin cleaner     Pre-op instructions  If you have any questions regarding the instructions, please do not hesitate to call our office.  [place]: [address], [place], [place] [address] -- [phone]  [place]: [address] [place], [place] [address] -- [phone]  [place]: [address], , [place] [address] -- [phone]   Website: [email or web address] 
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
## 2019-10-19 ENCOUNTER — Telehealth: Payer: Self-pay | Admitting: *Deleted

## 2019-10-19 NOTE — Telephone Encounter (Signed)
"  Dr. Amalia Hailey said I needed to call you so we can get things set up for surgery.  If you could, give me a call back.  Thank you and have a blessed day."

## 2019-10-19 NOTE — Telephone Encounter (Signed)
I am returning your call.  Dr. Amalia Hailey does surgeries on Thursdays.  Do you have a date that you like?  I'd like to do it as soon as possible."  Dr. Amalia Hailey can do it on October 28, 2019.  "That date is perfect.  I'm scheduled to start a new job in March.  So, this will give me time to heal."  I'll get it scheduled.  "I'm waiting to hear from someone about how much I'm going to have to pay up front.  I spoke to her and she said she would give me a call back."   (Please send her paperwork.)

## 2019-10-25 NOTE — Telephone Encounter (Signed)
I am returning your call.  "I was calling to see what time I'm supposed to be there for my surgery on Thursday."  Someone from the surgical center will give you a call a day or two prior to your surgery date and will give you your arrival time.  "Okay, I'll wait on their call."

## 2019-10-25 NOTE — Telephone Encounter (Signed)
"  I have surgery scheduled on Thursday, the 28th.  I was calling to check with you and see what time I'm scheduled for and what time I need to be there.  If you could give me a call back, please."

## 2019-10-26 ENCOUNTER — Telehealth: Payer: Self-pay | Admitting: *Deleted

## 2019-10-26 NOTE — Telephone Encounter (Signed)
DOS 10/28/2019 ENDOSCOPIC PLANTAR FASCIOTOMY LEFT FOOT - [id number]  BCBS: Eligibility Date - 10/01/2019 - 09/29/2198   In-Network    Max Per Benefit Period Year-to-Date Remaining  CoInsurance  40%    Deductible  $750.00 $750.00  Out-Of-Pocket 3  $2850.00 $2814.58   In Network  [employer] Coinsurance Authorization Required  Not Applicable  [id number] per Service Year  No

## 2019-10-28 ENCOUNTER — Other Ambulatory Visit: Payer: Self-pay | Admitting: Podiatry

## 2019-10-28 ENCOUNTER — Encounter: Payer: Self-pay | Admitting: Podiatry

## 2019-10-28 DIAGNOSIS — M722 Plantar fascial fibromatosis: Secondary | ICD-10-CM | POA: Diagnosis not present

## 2019-10-28 MED ORDER — OXYCODONE-ACETAMINOPHEN 5-325 MG PO TABS: 1.0000 | ORAL_TABLET | Freq: Four times a day (QID) | ORAL | 0 refills | Status: DC | PRN

## 2019-10-28 NOTE — Progress Notes (Signed)
PRN postop 

## 2019-11-02 ENCOUNTER — Telehealth: Payer: Self-pay

## 2019-11-02 NOTE — Telephone Encounter (Signed)
-----   Message from Viviana Simpler, [facility] sent at 11/02/2019 12:08 PM EST ----- Charm Rings, patient called and stated that she had surgery EPF and feels like patient is having an allergic reaction to the sock and took that off and did feel better but now it is itching like crazy and just wanted to know if she could just take the guaze off and put bandaids on the surgical site and I stated that I would send a message to the nurse in the [place] office and nurse will get back with you as Dr Amalia Hailey is in the [place] office today. Lattie Haw

## 2019-11-02 NOTE — Telephone Encounter (Signed)
I returned call to patient, she stated that she has been itching around her foot and leg, she took the stockinet off to find her leg very red and irritated.  She stated that her skin is very sensitive and broke out after surgery in the past.  I instructed her to take all bandages off and cover incisions with bandaid, rewrap foot with ace bandage, take Benadryl or Zyrtec for itching and continue to ice as needed.  She verbalized instructions and will follow up on Friday for her first post op visit.

## 2019-11-05 ENCOUNTER — Ambulatory Visit (INDEPENDENT_AMBULATORY_CARE_PROVIDER_SITE_OTHER): Payer: BLUE CROSS/BLUE SHIELD | Admitting: Podiatry

## 2019-11-05 ENCOUNTER — Encounter: Payer: Self-pay | Admitting: Podiatry

## 2019-11-05 ENCOUNTER — Other Ambulatory Visit: Payer: Self-pay

## 2019-11-05 DIAGNOSIS — Z9889 Other specified postprocedural states: Secondary | ICD-10-CM

## 2019-11-05 DIAGNOSIS — M722 Plantar fascial fibromatosis: Secondary | ICD-10-CM
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
## 2019-11-12 ENCOUNTER — Ambulatory Visit (INDEPENDENT_AMBULATORY_CARE_PROVIDER_SITE_OTHER): Payer: BLUE CROSS/BLUE SHIELD | Admitting: Podiatry

## 2019-11-12 ENCOUNTER — Other Ambulatory Visit: Payer: Self-pay

## 2019-11-12 ENCOUNTER — Encounter: Payer: Self-pay | Admitting: Podiatry

## 2019-11-12 DIAGNOSIS — Z9889 Other specified postprocedural states: Secondary | ICD-10-CM

## 2019-11-12 DIAGNOSIS — M722 Plantar fascial fibromatosis: Secondary | ICD-10-CM

## 2019-11-16 MED ORDER — NITROGLYCERIN 0.4 MG SL SUBL
0.40 | SUBLINGUAL_TABLET | SUBLINGUAL | Status: DC
Start: ? — End: 2019-11-16
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
## 2019-11-26 ENCOUNTER — Encounter: Payer: BLUE CROSS/BLUE SHIELD | Admitting: Podiatry

## 2019-11-29 ENCOUNTER — Ambulatory Visit
Admission: RE | Admit: 2019-11-29 | Discharge: 2019-11-29 | Disposition: A | Discharge status: Home / Self Care | Payer: BLUE CROSS/BLUE SHIELD | Source: Ambulatory Visit | Attending: Nurse Practitioner | Admitting: Nurse Practitioner

## 2019-11-29 DIAGNOSIS — Z1231 Encounter for screening mammogram for malignant neoplasm of breast: Secondary | ICD-10-CM | POA: Diagnosis present

## 2019-12-10 ENCOUNTER — Ambulatory Visit (INDEPENDENT_AMBULATORY_CARE_PROVIDER_SITE_OTHER): Payer: BLUE CROSS/BLUE SHIELD

## 2019-12-10 ENCOUNTER — Ambulatory Visit (INDEPENDENT_AMBULATORY_CARE_PROVIDER_SITE_OTHER): Payer: BLUE CROSS/BLUE SHIELD | Admitting: Podiatry

## 2019-12-10 ENCOUNTER — Other Ambulatory Visit: Payer: Self-pay

## 2019-12-10 DIAGNOSIS — M722 Plantar fascial fibromatosis: Secondary | ICD-10-CM

## 2019-12-10 DIAGNOSIS — M659 Synovitis and tenosynovitis, unspecified: Secondary | ICD-10-CM

## 2019-12-10 DIAGNOSIS — Z9889 Other specified postprocedural states: Secondary | ICD-10-CM

## 2019-12-10 MED ORDER — MELOXICAM 15 MG PO TABS: 15.0000 mg | ORAL_TABLET | Freq: Every day | ORAL | 1 refills | Status: DC
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
## 2019-12-21 ENCOUNTER — Ambulatory Visit: Payer: BLUE CROSS/BLUE SHIELD | Admitting: Family Medicine

## 2019-12-31 ENCOUNTER — Other Ambulatory Visit: Payer: Self-pay | Admitting: Family Medicine

## 2019-12-31 NOTE — Telephone Encounter (Signed)
Requested medication (s) are due for refill today: Cyanocobalamin, yes  Requested medication (s) are on the active medication list: yes  Last refill:  11/30/19  Future visit scheduled: no  Notes to clinic:  no assigned protocol  Requested medication (s) are due for refill today: Ergocalciferol, yes  Requested medication (s) are on the active medication list: yes  Last refill: 11/30/19  Future visit scheduled: no  Notes to clinic:  not delegated  Requested Prescriptions  Pending Prescriptions Disp Refills   cyanocobalamin (,VITAMIN B-12,) 1000 MCG/ML injection [Pharmacy Med Name: Cyanocobalamin 1000 MCG/ML Injection Solution] 2 mL 0    Sig: INJECT 1 ML INTO THE MUSCLE EVERY 14 DAYS      Off-Protocol Failed - 12/31/2019  9:17 AM      Failed - Medication not assigned to a protocol, review manually.      Passed - Valid encounter within last 12 months    Recent Outpatient Visits           6 months ago Vitamin B 12 deficiency   [place], [place], DO   7 months ago [place], Megan P, DO   9 months ago Acute midline thoracic back pain   [place], Megan P, DO   1 year ago Migraine without aura and without status migrainosus, not intractable   [place], Megan P, DO   1 year ago Migraine without aura and without status migrainosus, not intractable   [place], [place] P, DO             Off-Protocol Failed - 12/31/2019  9:17 AM      Failed - Medication not assigned to a protocol, review manually.      Passed - Valid encounter within last 12 months    Recent Outpatient Visits           6 months ago Vitamin B 12 deficiency   [place], [place], DO   7 months ago [place], Megan P, DO   9 months ago Acute midline thoracic back pain   [place], Megan P, DO   1 year ago  Migraine without aura and without status migrainosus, not intractable   [facility] [place], Megan P, DO   1 year ago Migraine without aura and without status migrainosus, not intractable   [employer], Megan P, DO                Vitamin D, Ergocalciferol, (DRISDOL) 1.25 MG (50000 UNIT) CAPS capsule [Pharmacy Med Name: Vitamin D (Ergocalciferol) 50000 UNIT Oral Capsule] 4 capsule 0    Sig: Take 1 capsule by mouth once a week      Endocrinology:  Vitamins - Vitamin D Supplementation Failed - 12/31/2019  9:17 AM      Failed - 50,000 IU strengths are not delegated      Failed - Ca in normal range and within 360 days    Calcium  Date Value Ref Range Status  03/30/2018 9.5 8.7 - 10.2 mg/dL Final   Calcium, Total  Date Value Ref Range Status  10/10/2014 9.2 8.5 - 10.1 mg/dL Final   Calcium, Ion  Date Value Ref Range Status  07/12/2008 1.20  Final          Failed - Phosphate in normal range and within 360 days    Phosphorus  Date Value Ref Range Status  10/10/2014 3.3 2.5 - 4.9 mg/dL Final          Failed - Vitamin D in normal range and within 360 days    Vit D, 1,25-Dihydroxy  Date Value Ref Range Status  04/06/2015 57.7 19.9 - 79.3 pg/mL Final   Vit D, 25-Hydroxy  Date Value Ref Range Status  03/30/2018 18.6 (L) 30.0 - 100.0 ng/mL Final    Comment:    Vitamin D deficiency has been defined by the [place] practice guideline as a level of serum 25-OH vitamin D less than 20 ng/mL (1,2). The Endocrine Society went on to further define vitamin D insufficiency as a level between 21 and 29 ng/mL (2). 1. IOM (Institute of Medicine). 2010. Dietary reference    intakes for calcium and D. [place]: The    [employer]. 2. Holick MF, [place], Bischoff-Ferrari HA, et al.    Evaluation, treatment, and prevention of vitamin D    deficiency: an Endocrine Society clinical practice     guideline. JCEM. 2011 Jul; 96(7):1911-30.           Passed - Valid encounter within last 12 months    Recent Outpatient Visits           6 months ago Vitamin B 12 deficiency   [address], [place], DO   7 months ago [place], Megan P, DO   9 months ago Acute midline thoracic back pain   [place], Megan P, DO   1 year ago Migraine without aura and without status migrainosus, not intractable   [place], Megan P, DO   1 year ago Migraine without aura and without status migrainosus, not intractable   [place], [place], DO

## 2019-12-31 NOTE — Telephone Encounter (Signed)
Called and confirmed that patient has switched providers. Medications refused.

## 2019-12-31 NOTE — Telephone Encounter (Signed)
Looks like she's changed PCP- please confirm

## 2019-12-31 NOTE — Telephone Encounter (Signed)
LOV 06/22/19

## 2020-01-11 ENCOUNTER — Telehealth: Payer: Self-pay | Admitting: Family Medicine

## 2020-01-11 NOTE — Telephone Encounter (Signed)
Please advise.   Copied from [place] [phone]. Topic: General - Other >> Jan 10, 2020  1:28 PM Celene Kras wrote: Reason for CRM: Pt called stating that she used to be a pts of Dr. [name]. Pt states that the only reason she left was because her insurance was not accepted by the office anymore. Pt is requesting to be put back under Dr. Durenda Age care if possible. Please advise.

## 2020-01-11 NOTE — Telephone Encounter (Signed)
Pt stated that she would just pay for the visits. She stated that the insurance company would like it to still be filed and the pt would take care of the balance.

## 2020-01-11 NOTE — Telephone Encounter (Signed)
I have no problem with that as long as there's not an insurance issue for her.

## 2020-01-14 ENCOUNTER — Encounter: Payer: Self-pay | Admitting: Family Medicine

## 2020-01-14 ENCOUNTER — Other Ambulatory Visit: Payer: Self-pay

## 2020-01-14 ENCOUNTER — Ambulatory Visit: Payer: BLUE CROSS/BLUE SHIELD | Admitting: Family Medicine

## 2020-01-14 VITALS — BP 155/89 | HR 67 | Temp 98.1°F | Ht 66.34 in | Wt 180.0 lb

## 2020-01-14 DIAGNOSIS — M109 Gout, unspecified: Secondary | ICD-10-CM

## 2020-01-14 DIAGNOSIS — K219 Gastro-esophageal reflux disease without esophagitis: Secondary | ICD-10-CM

## 2020-01-14 DIAGNOSIS — E559 Vitamin D deficiency, unspecified: Secondary | ICD-10-CM

## 2020-01-14 DIAGNOSIS — F419 Anxiety disorder, unspecified: Secondary | ICD-10-CM

## 2020-01-14 DIAGNOSIS — I10 Essential (primary) hypertension: Secondary | ICD-10-CM

## 2020-01-14 DIAGNOSIS — R002 Palpitations: Secondary | ICD-10-CM

## 2020-01-14 DIAGNOSIS — M797 Fibromyalgia: Secondary | ICD-10-CM

## 2020-01-14 DIAGNOSIS — G43009 Migraine without aura, not intractable, without status migrainosus: Secondary | ICD-10-CM

## 2020-01-14 DIAGNOSIS — E782 Mixed hyperlipidemia: Secondary | ICD-10-CM

## 2020-01-14 DIAGNOSIS — M8000XS Age-related osteoporosis with current pathological fracture, unspecified site, sequela: Secondary | ICD-10-CM

## 2020-01-14 DIAGNOSIS — E538 Deficiency of other specified B group vitamins: Secondary | ICD-10-CM

## 2020-01-14 MED ORDER — ESCITALOPRAM OXALATE 5 MG PO TABS: 5.0000 mg | ORAL_TABLET | Freq: Every day | ORAL | 1 refills | Status: DC

## 2020-01-14 MED ORDER — EPINEPHRINE 0.3 MG/0.3ML IJ SOAJ: 0.3000 mg | Freq: Once | INTRAMUSCULAR | 12 refills | Status: DC

## 2020-01-14 MED ORDER — BUSPIRONE HCL 5 MG PO TABS: 5.0000 mg | ORAL_TABLET | Freq: Three times a day (TID) | ORAL | 6 refills | Status: DC

## 2020-01-14 NOTE — Progress Notes (Signed)
BP (!) 155/89 (BP Location: Left Arm, Patient Position: Sitting, Cuff Size: Normal)   Pulse 67   Temp 98.1 F (36.7 C) (Oral)   Ht 5' 6.34" (1.685 m)   Wt 180 lb (81.6 kg)   SpO2 97%   BMI 28.76 kg/m    Subjective:    Patient ID: Tina Harvey, female    DOB: [birth year], 49 y.o.   MRN: [id number]  HPI: Tina Harvey is a 49 y.o. female  Chief Complaint  Patient presents with  . reestablish care   Left due to medical insurance. Was not comfortable with her doctor that she changed to.  Went to see cardiology for palpitations. Was started on metoprolol and she got upset seeing that doctor.   ANXIETY/STRESS- was taken off her lexapro, but feels like she needs it. Would like to go back on it Duration: chronic Status:exacerbated Anxious mood: yes  Excessive worrying: yes Irritability: yes  Sweating: no Nausea: no Palpitations:yes Hyperventilation: no Panic attacks: no Agoraphobia: no  Obscessions/compulsions: no Depressed mood: yes Depression screen [facility] 2/9 01/14/2020 06/22/2019 03/30/2018 02/12/2017 06/19/2015  Decreased Interest 2 0 0 1 0  Down, Depressed, Hopeless 1 1 0 1 0  PHQ - 2 Score 3 1 0 2 0  Altered sleeping 3 1 3  - -  Tired, decreased energy 2 1 3  - -  Change in appetite 0 0 0 - -  Feeling bad or failure about yourself  0 0 0 - -  Trouble concentrating 1 0 2 - -  Moving slowly or fidgety/restless 0 0 0 - -  Suicidal thoughts 0 0 0 - -  PHQ-9 Score 9 3 8  - -  Difficult doing work/chores Somewhat difficult Somewhat difficult Somewhat difficult - -   Anhedonia: no Weight changes: no Insomnia: no   Hypersomnia: no Fatigue/loss of energy: yes Feelings of worthlessness: no Feelings of guilt: no Impaired concentration/indecisiveness: no Suicidal ideations: no  Crying spells: no Recent Stressors/Life Changes: yes   Relationship problems: no   Family stress: no     Financial stress: no    Job stress: no    Recent death/loss: no  Migraines doing well.  Tolerating her medicine well. No concerns.   HYPERTENSION / HYPERLIPIDEMIA Satisfied with current treatment? yes Duration of hypertension: chronic BP monitoring frequency: not checking BP medication side effects: no Duration of hyperlipidemia: chronic Cholesterol medication side effects: no Cholesterol supplements: none Medication compliance: excellent compliance Aspirin: no Recent stressors: yes Recurrent headaches: no Visual changes: no Palpitations: no Dyspnea: no Chest pain: no Lower extremity edema: no Dizzy/lightheaded: no  Relevant past medical, surgical, family and social history reviewed and updated as indicated. Interim medical history since our last visit reviewed. Allergies and medications reviewed and updated.  Review of Systems  Constitutional: Positive for fatigue. Negative for activity change, appetite change, chills, diaphoresis, fever and unexpected weight change.  HENT: Negative.   Respiratory: Negative.   Cardiovascular: Positive for palpitations. Negative for chest pain and leg swelling.  Gastrointestinal: Negative.   Musculoskeletal: Negative.   Skin: Negative.   Neurological: Negative.   Psychiatric/Behavioral: Positive for dysphoric mood. Negative for agitation, behavioral problems, confusion, decreased concentration, hallucinations, self-injury, sleep disturbance and suicidal ideas. The patient is nervous/anxious. The patient is not hyperactive.     Per HPI unless specifically indicated above     Objective:    BP (!) 155/89 (BP Location: Left Arm, Patient Position: Sitting, Cuff Size: Normal)   Pulse 67   Temp 98.1  F (36.7 C) (Oral)   Ht 5' 6.34" (1.685 m)   Wt 180 lb (81.6 kg)   SpO2 97%   BMI 28.76 kg/m   Wt Readings from Last 3 Encounters:  01/14/20 180 lb (81.6 kg)  06/22/19 188 lb (85.3 kg)  05/17/19 186 lb (84.4 kg)    Physical Exam Vitals and nursing note reviewed.  Constitutional:      General: She is not in acute distress.     Appearance: Normal appearance. She is not ill-appearing, toxic-appearing or diaphoretic.  HENT:     Head: Normocephalic and atraumatic.     Right Ear: External ear normal.     Left Ear: External ear normal.     Nose: Nose normal.     Mouth/Throat:     Mouth: Mucous membranes are moist.     Pharynx: Oropharynx is clear.  Eyes:     General: No scleral icterus.       Right eye: No discharge.        Left eye: No discharge.     Extraocular Movements: Extraocular movements intact.     Conjunctiva/sclera: Conjunctivae normal.     Pupils: Pupils are equal, round, and reactive to light.  Cardiovascular:     Rate and Rhythm: Normal rate and regular rhythm.     Pulses: Normal pulses.     Heart sounds: Normal heart sounds. No murmur. No friction rub. No gallop.   Pulmonary:     Effort: Pulmonary effort is normal. No respiratory distress.     Breath sounds: Normal breath sounds. No stridor. No wheezing, rhonchi or rales.  Chest:     Chest wall: No tenderness.  Musculoskeletal:        General: Normal range of motion.     Cervical back: Normal range of motion and neck supple.  Skin:    General: Skin is warm and dry.     Capillary Refill: Capillary refill takes less than 2 seconds.     Coloration: Skin is not jaundiced or pale.     Findings: No bruising, erythema, lesion or rash.  Neurological:     General: No focal deficit present.     Mental Status: She is alert and oriented to person, place, and time. Mental status is at baseline.  Psychiatric:        Mood and Affect: Mood normal.        Behavior: Behavior normal.        Thought Content: Thought content normal.        Judgment: Judgment normal.     Results for orders placed or performed in visit on 03/30/18  Bayer DCA Hb A1c Waived  Result Value Ref Range   HB A1C (BAYER DCA - WAIVED) 5.2 <7.0 %  CBC with Differential/Platelet  Result Value Ref Range   WBC 5.7 3.4 - 10.8 x10E3/uL   RBC 4.45 3.77 - 5.28 x10E6/uL   Hemoglobin  11.8 11.1 - 15.9 g/dL   Hematocrit 35.5 34.0 - 46.6 %   MCV 80 79 - 97 fL   MCH 26.5 (L) 26.6 - 33.0 pg   MCHC 33.2 31.5 - 35.7 g/dL   RDW 14.0 12.3 - 15.4 %   Platelets 293 150 - 450 x10E3/uL   Neutrophils 54 Not Estab. %   Lymphs 34 Not Estab. %   Monocytes 9 Not Estab. %   Eos 3 Not Estab. %   Basos 0 Not Estab. %   Neutrophils Absolute 3.0 1.4 - 7.0 x10E3/uL  Lymphocytes Absolute 1.9 0.7 - 3.1 x10E3/uL   Monocytes Absolute 0.5 0.1 - 0.9 x10E3/uL   EOS (ABSOLUTE) 0.2 0.0 - 0.4 x10E3/uL   Basophils Absolute 0.0 0.0 - 0.2 x10E3/uL   Immature Granulocytes 0 Not Estab. %   Immature Grans (Abs) 0.0 0.0 - 0.1 x10E3/uL  Comprehensive metabolic panel  Result Value Ref Range   Glucose 74 65 - 99 mg/dL   BUN 11 6 - 24 mg/dL   Creatinine, Ser 0.89 0.57 - 1.00 mg/dL   GFR calc non Af Amer 79 >59 mL/min/1.73   GFR calc Af Amer 91 >59 mL/min/1.73   BUN/Creatinine Ratio 12 9 - 23   Sodium 142 134 - 144 mmol/L   Potassium 3.7 3.5 - 5.2 mmol/L   Chloride 102 96 - 106 mmol/L   CO2 26 20 - 29 mmol/L   Calcium 9.5 8.7 - 10.2 mg/dL   Total Protein 7.4 6.0 - 8.5 g/dL   Albumin 4.4 3.5 - 5.5 g/dL   Globulin, Total 3.0 1.5 - 4.5 g/dL   Albumin/Globulin Ratio 1.5 1.2 - 2.2   Bilirubin Total 0.3 0.0 - 1.2 mg/dL   Alkaline Phosphatase 94 39 - 117 IU/L   AST 13 0 - 40 IU/L   ALT 9 0 - 32 IU/L  VITAMIN D 25 Hydroxy (Vit-D Deficiency, Fractures)  Result Value Ref Range   Vit D, 25-Hydroxy 18.6 (L) 30.0 - 100.0 ng/mL  TSH  Result Value Ref Range   TSH 1.330 0.450 - 4.500 uIU/mL  Lipid Panel w/o Chol/HDL Ratio  Result Value Ref Range   Cholesterol, Total 211 (H) 100 - 199 mg/dL   Triglycerides 157 (H) 0 - 149 mg/dL   HDL 48 >39 mg/dL   VLDL Cholesterol Cal 31 5 - 40 mg/dL   LDL Calculated 132 (H) 0 - 99 mg/dL  Iron and TIBC  Result Value Ref Range   Total Iron Binding Capacity 437 250 - 450 ug/dL   UIBC 383 131 - 425 ug/dL   Iron 54 27 - 159 ug/dL   Iron Saturation 12 (L) 15 - 55 %    Ferritin  Result Value Ref Range   Ferritin 16 15 - 150 ng/mL  Cortisol  Result Value Ref Range   Cortisol 10.1 ug/dL      Assessment & Plan:   Problem List Items Addressed This Visit      Cardiovascular and Mediastinum   Migraine without aura and without status migrainosus, not intractable    Under good control on current regimen. Continue current regimen. Continue to monitor. Call with any concerns. Refills up to date.       Relevant Medications   atorvastatin (LIPITOR) 10 MG tablet   CARTIA XT 120 MG 24 hr capsule   gabapentin (NEURONTIN) 100 MG capsule   lisinopril-hydrochlorothiazide (ZESTORETIC) 10-12.5 MG tablet   escitalopram (LEXAPRO) 5 MG tablet   Essential hypertension, benign    Under good control on current regimen. Continue current regimen. Continue to monitor. Call with any concerns. Refills up to date.       Relevant Medications   atorvastatin (LIPITOR) 10 MG tablet   CARTIA XT 120 MG 24 hr capsule   lisinopril-hydrochlorothiazide (ZESTORETIC) 10-12.5 MG tablet     Digestive   Gastroesophageal reflux disease    Under good control on current regimen. Continue current regimen. Continue to monitor. Call with any concerns. Refills up to date.       Relevant Medications   sucralfate (CARAFATE) 1 GM/10ML  suspension     Musculoskeletal and Integument   Osteoporosis    Under good control on current regimen. Continue current regimen. Continue to monitor. Call with any concerns. Refills up to date.         Other   Fibromyalgia    Under good control on current regimen. Continue current regimen. Continue to monitor. Call with any concerns. Refills up to date.       Relevant Medications   gabapentin (NEURONTIN) 100 MG capsule   escitalopram (LEXAPRO) 5 MG tablet   Hyperlipidemia    Under good control on current regimen. Continue current regimen. Continue to monitor. Call with any concerns. Refills up to date.       Relevant Medications    atorvastatin (LIPITOR) 10 MG tablet   CARTIA XT 120 MG 24 hr capsule   lisinopril-hydrochlorothiazide (ZESTORETIC) 10-12.5 MG tablet   Gout    Under good control on current regimen. Continue current regimen. Continue to monitor. Call with any concerns. Refills up to date.       Vitamin D deficiency    Will recheck labs next visit. Await results. Treat as needed.        B12 deficiency    Will recheck labs next visit. Await results.       Anxiety - Primary    Would like to restart her medicine. Restart lexapro at lower dose and PRN buspar. Call with any concerns. Continue to monitor.       Relevant Medications   escitalopram (LEXAPRO) 5 MG tablet   busPIRone (BUSPAR) 5 MG tablet    Other Visit Diagnoses    Palpitations       Will obtain records from cardiology and previous PCP. Will get her into cardiology if needed.        Follow up plan: Return for As able Physical, records release from [facility] and cardiology.

## 2020-01-14 NOTE — Patient Instructions (Signed)
We are recommending the vaccine to everyone who has not had an allergic reaction to any of the components of the vaccine. If you have specific questions about the vaccine, please bring them up with your health care provider to discuss them.   We will likely not be getting the vaccine in the office for the first rounds of vaccinations. The way they are releasing the vaccines is going to be through the health systems (like [place], [facility], [facility], [facility]), through your county health department, or through the pharmacies.   The [facility] Department is giving vaccines to those 65+ and Health Care Workers Teachers and [place] providers start 11/24/19, Essential workers start 3/10 and those with co-morbidities start 12/22/19 Call [phone] to schedule  If you are 65+ you can get a vaccine through [facility] by signing up for an appointment.  You can sign up by going to: [email or web address].  You can get more information by going to: [email or web address]

## 2020-01-17 NOTE — Assessment & Plan Note (Signed)
Will recheck labs next visit. Await results.

## 2020-01-17 NOTE — Assessment & Plan Note (Signed)
Will recheck labs next visit. Await results. Treat as needed.

## 2020-01-17 NOTE — Assessment & Plan Note (Signed)
Under good control on current regimen. Continue current regimen. Continue to monitor. Call with any concerns. Refills up to date.   

## 2020-01-17 NOTE — Assessment & Plan Note (Signed)
Would like to restart her medicine. Restart lexapro at lower dose and PRN buspar. Call with any concerns. Continue to monitor.

## 2020-01-28 ENCOUNTER — Encounter: Payer: Self-pay | Admitting: Family Medicine

## 2020-01-28 LAB — HM COLONOSCOPY

## 2020-02-04 ENCOUNTER — Ambulatory Visit (INDEPENDENT_AMBULATORY_CARE_PROVIDER_SITE_OTHER): Payer: BLUE CROSS/BLUE SHIELD | Admitting: Family Medicine

## 2020-02-04 ENCOUNTER — Other Ambulatory Visit: Payer: Self-pay

## 2020-02-04 ENCOUNTER — Encounter: Payer: Self-pay | Admitting: Family Medicine

## 2020-02-04 VITALS — BP 143/87 | HR 65 | Temp 97.9°F | Ht 66.54 in | Wt 181.6 lb

## 2020-02-04 DIAGNOSIS — E559 Vitamin D deficiency, unspecified: Secondary | ICD-10-CM | POA: Diagnosis not present

## 2020-02-04 DIAGNOSIS — I10 Essential (primary) hypertension: Secondary | ICD-10-CM

## 2020-02-04 DIAGNOSIS — Z0000 Encounter for general adult medical examination without abnormal findings: Secondary | ICD-10-CM

## 2020-02-04 DIAGNOSIS — E782 Mixed hyperlipidemia: Secondary | ICD-10-CM | POA: Diagnosis not present

## 2020-02-04 DIAGNOSIS — E538 Deficiency of other specified B group vitamins: Secondary | ICD-10-CM

## 2020-02-04 LAB — URINALYSIS, ROUTINE W REFLEX MICROSCOPIC
Bilirubin, UA: NEGATIVE
Glucose, UA: NEGATIVE
Ketones, UA: NEGATIVE
Nitrite, UA: NEGATIVE
Protein,UA: NEGATIVE
RBC, UA: NEGATIVE
Specific Gravity, UA: 1.02 (ref 1.005–1.030)
Urobilinogen, Ur: 1 mg/dL (ref 0.2–1.0)
pH, UA: 7 (ref 5.0–7.5)

## 2020-02-04 LAB — MICROSCOPIC EXAMINATION
Bacteria, UA: NONE SEEN
RBC, Urine: NONE SEEN /hpf (ref 0–2)

## 2020-02-04 LAB — BAYER DCA HB A1C WAIVED: HB A1C (BAYER DCA - WAIVED): 5.4 % (ref <7.0)

## 2020-02-04 LAB — MICROALBUMIN, URINE WAIVED
Creatinine, Urine Waived: 100 mg/dL (ref 10–300)
Microalb, Ur Waived: 30 mg/L — ABNORMAL HIGH (ref 0–19)
Microalb/Creat Ratio: <30 mg/g (ref <30)
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
## 2020-02-04 NOTE — Assessment & Plan Note (Signed)
Under good control on current regimen. Continue current regimen. Continue to monitor. Call with any concerns. Refills given. Labs drawn today.   

## 2020-02-04 NOTE — Patient Instructions (Signed)
Health Maintenance, Female Adopting a healthy lifestyle and getting preventive care are important in promoting health and wellness. Ask your health care provider about:  The right schedule for you to have regular tests and exams.  Things you can do on your own to prevent diseases and keep yourself healthy. What should I know about diet, weight, and exercise? Eat a healthy diet   Eat a diet that includes plenty of vegetables, fruits, low-fat dairy products, and lean protein.  Do not eat a lot of foods that are high in solid fats, added sugars, or sodium. Maintain a healthy weight Body mass index (BMI) is used to identify weight problems. It estimates body fat based on height and weight. Your health care provider can help determine your BMI and help you achieve or maintain a healthy weight. Get regular exercise Get regular exercise. This is one of the most important things you can do for your health. Most adults should:  Exercise for at least 150 minutes each week. The exercise should increase your heart rate and make you sweat (moderate-intensity exercise).  Do strengthening exercises at least twice a week. This is in addition to the moderate-intensity exercise.  Spend less time sitting. Even light physical activity can be beneficial. Watch cholesterol and blood lipids Have your blood tested for lipids and cholesterol at 48 years of age, then have this test every 5 years. Have your cholesterol levels checked more often if:  Your lipid or cholesterol levels are high.  You are older than 48 years of age.  You are at high risk for heart disease. What should I know about cancer screening? Depending on your health history and family history, you may need to have cancer screening at various ages. This may include screening for:  Breast cancer.  Cervical cancer.  Colorectal cancer.  Skin cancer.  Lung cancer. What should I know about heart disease, diabetes, and high blood  pressure? Blood pressure and heart disease  High blood pressure causes heart disease and increases the risk of stroke. This is more likely to develop in people who have high blood pressure readings, are of African descent, or are overweight.  Have your blood pressure checked: ? Every 3-5 years if you are 18-39 years of age. ? Every year if you are 40 years old or older. Diabetes Have regular diabetes screenings. This checks your fasting blood sugar level. Have the screening done:  Once every three years after age 40 if you are at a normal weight and have a low risk for diabetes.  More often and at a younger age if you are overweight or have a high risk for diabetes. What should I know about preventing infection? Hepatitis B If you have a higher risk for hepatitis B, you should be screened for this virus. Talk with your health care provider to find out if you are at risk for hepatitis B infection. Hepatitis C Testing is recommended for:  Everyone born from 1945 through 1965.  Anyone with known risk factors for hepatitis C. Sexually transmitted infections (STIs)  Get screened for STIs, including gonorrhea and chlamydia, if: ? You are sexually active and are younger than 48 years of age. ? You are older than 48 years of age and your health care provider tells you that you are at risk for this type of infection. ? Your sexual activity has changed since you were last screened, and you are at increased risk for chlamydia or gonorrhea. Ask your health care provider if   you are at risk.  Ask your health care provider about whether you are at high risk for HIV. Your health care provider may recommend a prescription medicine to help prevent HIV infection. If you choose to take medicine to prevent HIV, you should first get tested for HIV. You should then be tested every 3 months for as long as you are taking the medicine. Pregnancy  If you are about to stop having your period (premenopausal) and  you may become pregnant, seek counseling before you get pregnant.  Take 400 to 800 micrograms (mcg) of folic acid every day if you become pregnant.  Ask for birth control (contraception) if you want to prevent pregnancy. Osteoporosis and menopause Osteoporosis is a disease in which the bones lose minerals and strength with aging. This can result in bone fractures. If you are 65 years old or older, or if you are at risk for osteoporosis and fractures, ask your health care provider if you should:  Be screened for bone loss.  Take a calcium or vitamin D supplement to lower your risk of fractures.  Be given hormone replacement therapy (HRT) to treat symptoms of menopause. Follow these instructions at home: Lifestyle  Do not use any products that contain nicotine or tobacco, such as cigarettes, e-cigarettes, and chewing tobacco. If you need help quitting, ask your health care provider.  Do not use street drugs.  Do not share needles.  Ask your health care provider for help if you need support or information about quitting drugs. Alcohol use  Do not drink alcohol if: ? Your health care provider tells you not to drink. ? You are pregnant, may be pregnant, or are planning to become pregnant.  If you drink alcohol: ? Limit how much you use to 0-1 drink a day. ? Limit intake if you are breastfeeding.  Be aware of how much alcohol is in your drink. In the [place] one drink equals one 12 oz bottle of beer (355 mL), one 5 oz glass of wine (148 mL), or one 1 oz glass of hard liquor (44 mL). General instructions  Schedule regular health, dental, and eye exams.  Stay current with your vaccines.  Tell your health care provider if: ? You often feel depressed. ? You have ever been abused or do not feel safe at home. Summary  Adopting a healthy lifestyle and getting preventive care are important in promoting health and wellness.  Follow your health care provider's instructions about healthy  diet, exercising, and getting tested or screened for diseases.  Follow your health care provider's instructions on monitoring your cholesterol and blood pressure. This information is not intended to replace advice given to you by your health care provider. Make sure you discuss any questions you have with your health care provider. Document Revised: 09/09/2018 Document Reviewed: 09/09/2018 Elsevier Patient Education  2020 [employer]  

## 2020-02-04 NOTE — Assessment & Plan Note (Signed)
Rechecking labs today. Await results.  

## 2020-02-05 LAB — COMPREHENSIVE METABOLIC PANEL
ALT: 9 IU/L (ref 0–32)
AST: 15 IU/L (ref 0–40)
Albumin/Globulin Ratio: 1.4 (ref 1.2–2.2)
Albumin: 4.3 g/dL (ref 3.8–4.8)
Alkaline Phosphatase: 88 IU/L (ref 39–117)
BUN/Creatinine Ratio: 11 (ref 9–23)
BUN: 9 mg/dL (ref 6–24)
Bilirubin Total: 0.3 mg/dL (ref 0.0–1.2)
CO2: 25 mmol/L (ref 20–29)
Calcium: 9.5 mg/dL (ref 8.7–10.2)
Chloride: 102 mmol/L (ref 96–106)
Creatinine, Ser: 0.83 mg/dL (ref 0.57–1.00)
GFR calc Af Amer: 97 mL/min/{1.73_m2} (ref >59)
GFR calc non Af Amer: 84 mL/min/{1.73_m2} (ref >59)
Globulin, Total: 3 g/dL (ref 1.5–4.5)
Glucose: 81 mg/dL (ref 65–99)
Potassium: 4 mmol/L (ref 3.5–5.2)
Sodium: 142 mmol/L (ref 134–144)
Total Protein: 7.3 g/dL (ref 6.0–8.5)

## 2020-02-05 LAB — CBC WITH DIFFERENTIAL/PLATELET
Basophils Absolute: 0 10*3/uL (ref 0.0–0.2)
Basos: 1 %
EOS (ABSOLUTE): 0.3 10*3/uL (ref 0.0–0.4)
Eos: 5 %
Hematocrit: 35.3 % (ref 34.0–46.6)
Hemoglobin: 11.8 g/dL (ref 11.1–15.9)
Immature Grans (Abs): 0 10*3/uL (ref 0.0–0.1)
Immature Granulocytes: 0 %
Lymphocytes Absolute: 2.1 10*3/uL (ref 0.7–3.1)
Lymphs: 35 %
MCH: 27.1 pg (ref 26.6–33.0)
MCHC: 33.4 g/dL (ref 31.5–35.7)
MCV: 81 fL (ref 79–97)
Monocytes Absolute: 0.5 10*3/uL (ref 0.1–0.9)
Monocytes: 9 %
Neutrophils Absolute: 3 10*3/uL (ref 1.4–7.0)
Neutrophils: 50 %
Platelets: 273 10*3/uL (ref 150–450)
RBC: 4.35 x10E6/uL (ref 3.77–5.28)
RDW: 14.2 % (ref 11.7–15.4)
WBC: 6 10*3/uL (ref 3.4–10.8)

## 2020-02-05 LAB — TSH: TSH: 1.51 u[IU]/mL (ref 0.450–4.500)

## 2020-02-05 LAB — VITAMIN B12: Vitamin B-12: 1112 pg/mL (ref 232–1245)

## 2020-02-05 LAB — LIPID PANEL W/O CHOL/HDL RATIO
Cholesterol, Total: 200 mg/dL — ABNORMAL HIGH (ref 100–199)
HDL: 53 mg/dL (ref >39)
LDL Chol Calc (NIH): 123 mg/dL — ABNORMAL HIGH (ref 0–99)
Triglycerides: 137 mg/dL (ref 0–149)
VLDL Cholesterol Cal: 24 mg/dL (ref 5–40)

## 2020-02-05 LAB — VITAMIN D 25 HYDROXY (VIT D DEFICIENCY, FRACTURES): Vit D, 25-Hydroxy: 38.5 ng/mL (ref 30.0–100.0)

## 2020-03-22 ENCOUNTER — Ambulatory Visit: Payer: BLUE CROSS/BLUE SHIELD | Admitting: Family Medicine

## 2020-03-22 ENCOUNTER — Encounter: Payer: Self-pay | Admitting: Family Medicine

## 2020-03-22 ENCOUNTER — Other Ambulatory Visit: Payer: Self-pay

## 2020-03-22 VITALS — BP 156/90 | HR 76 | Temp 98.4°F | Wt 180.6 lb

## 2020-03-22 DIAGNOSIS — I10 Essential (primary) hypertension: Secondary | ICD-10-CM

## 2020-03-22 DIAGNOSIS — F419 Anxiety disorder, unspecified: Secondary | ICD-10-CM

## 2020-03-22 DIAGNOSIS — Z113 Encounter for screening for infections with a predominantly sexual mode of transmission: Secondary | ICD-10-CM | POA: Diagnosis not present

## 2020-03-22 DIAGNOSIS — F5104 Psychophysiologic insomnia: Secondary | ICD-10-CM

## 2020-03-22 LAB — WET PREP FOR TRICH, YEAST, CLUE
Clue Cell Exam: POSITIVE — AB
Trichomonas Exam: NEGATIVE
Yeast Exam: NEGATIVE

## 2020-03-22 MED ORDER — ZOLPIDEM TARTRATE 5 MG PO TABS: 5.0000 mg | ORAL_TABLET | Freq: Every evening | ORAL | 1 refills | Status: DC | PRN

## 2020-03-22 MED ORDER — ESCITALOPRAM OXALATE 10 MG PO TABS: 10.0000 mg | ORAL_TABLET | Freq: Every day | ORAL | 1 refills | Status: DC

## 2020-03-22 MED ORDER — GABAPENTIN 100 MG PO CAPS: 100.0000 mg | ORAL_CAPSULE | Freq: Every day | ORAL | 1 refills | Status: DC

## 2020-03-22 MED ORDER — FLUCONAZOLE 150 MG PO TABS: 150.0000 mg | ORAL_TABLET | Freq: Once | ORAL | 0 refills | Status: AC

## 2020-03-22 MED ORDER — LISINOPRIL-HYDROCHLOROTHIAZIDE 20-25 MG PO TABS: 1.0000 | ORAL_TABLET | Freq: Every day | ORAL | 1 refills | Status: DC

## 2020-03-22 MED ORDER — CYANOCOBALAMIN 1000 MCG/ML IJ SOLN: 1000.0000 ug | INTRAMUSCULAR | 12 refills | Status: DC

## 2020-03-22 NOTE — Assessment & Plan Note (Signed)
In acute exacerbation due to social stresses. Will increase her lexapro to 10mg . Continue buspar. Will reach out to her counselor. Call with any concerns. Recheck 1 month.

## 2020-03-22 NOTE — Assessment & Plan Note (Signed)
Will increase her lisinopril-HCTZ to 20-25 while she is under this stress. Recheck 1 month. Call with any concerns.

## 2020-03-22 NOTE — Progress Notes (Signed)
BP (!) 156/90 (BP Location: Left Arm, Cuff Size: Large)   Pulse 76   Temp 98.4 F (36.9 C) (Oral)   Wt 180 lb 9.6 oz (81.9 kg)   SpO2 100%   BMI 28.68 kg/m    Subjective:    Patient ID: Real Cons, female    DOB: [birth year], 49 y.o.   MRN: [id number]  HPI: Tina Harvey is a 49 y.o. female  Chief Complaint  Patient presents with  . STD Screening  . Sleeping Problem   Has been under a lot of stress. Her daughter-in-law didn't speak to her during their visit. Just found out that her husband was arrested for solicitation. She is having some issues with her other family members as well. She is very upset. Concerned about her family. Unsure if she's going to stay in her marriage. She has only been sleeping about an hour a night. Has been very tired and very upset.     STD SCREENING Sexual activity:  Recent unprotected sexual encounter Contraception: no Recent unprotected intercourse: yes History of sexually transmitted diseases: no Previous sexually transmitted disease screening: yes Genital lesions: no Penile discharge: no Dysuria: no Swollen lymph nodes: no Fevers: no Rash: no  HYPERTENSION Hypertension status: exacerbated  Satisfied with current treatment? yes Duration of hypertension: chronic BP monitoring frequency:  not checking BP medication side effects:  no Medication compliance: excellent compliance Previous BP meds: Aspirin: no Recurrent headaches: yes Visual changes: no Palpitations: no Dyspnea: no Chest pain: no Lower extremity edema: no Dizzy/lightheaded: no  INSOMNIA Duration: 3 weeks Satisfied with sleep quality: no Difficulty falling asleep: yes Difficulty staying asleep: yes Waking a few hours after sleep onset: yes Early morning awakenings: yes Daytime hypersomnolence: yes Wakes feeling refreshed: yes Good sleep hygiene: yes Apnea: no Snoring: no Depressed/anxious mood: yes Recent stress: yes Restless legs/nocturnal leg cramps:  no Chronic pain/arthritis: yes History of sleep study: no Treatments attempted: melatonin, uinsom and benadryl   Relevant past medical, surgical, family and social history reviewed and updated as indicated. Interim medical history since our last visit reviewed. Allergies and medications reviewed and updated.  Review of Systems  Constitutional: Negative.   Respiratory: Negative.   Cardiovascular: Negative.   Gastrointestinal: Negative.   Musculoskeletal: Negative.   Skin: Negative.   Neurological: Negative.   Psychiatric/Behavioral: Positive for dysphoric mood and sleep disturbance. Negative for agitation, behavioral problems, confusion, decreased concentration, hallucinations, self-injury and suicidal ideas. The patient is nervous/anxious. The patient is not hyperactive.     Per HPI unless specifically indicated above     Objective:    BP (!) 156/90 (BP Location: Left Arm, Cuff Size: Large)   Pulse 76   Temp 98.4 F (36.9 C) (Oral)   Wt 180 lb 9.6 oz (81.9 kg)   SpO2 100%   BMI 28.68 kg/m   Wt Readings from Last 3 Encounters:  03/22/20 180 lb 9.6 oz (81.9 kg)  02/04/20 181 lb 9.6 oz (82.4 kg)  01/14/20 180 lb (81.6 kg)    Physical Exam Vitals and nursing note reviewed.  Constitutional:      General: She is not in acute distress.    Appearance: Normal appearance. She is not ill-appearing, toxic-appearing or diaphoretic.  HENT:     Head: Normocephalic and atraumatic.     Right Ear: External ear normal.     Left Ear: External ear normal.     Nose: Nose normal.     Mouth/Throat:     Mouth: Mucous  membranes are moist.     Pharynx: Oropharynx is clear.  Eyes:     General: No scleral icterus.       Right eye: No discharge.        Left eye: No discharge.     Extraocular Movements: Extraocular movements intact.     Conjunctiva/sclera: Conjunctivae normal.     Pupils: Pupils are equal, round, and reactive to light.  Cardiovascular:     Rate and Rhythm: Normal rate and  regular rhythm.     Pulses: Normal pulses.     Heart sounds: Normal heart sounds. No murmur heard.  No friction rub. No gallop.   Pulmonary:     Effort: Pulmonary effort is normal. No respiratory distress.     Breath sounds: Normal breath sounds. No stridor. No wheezing, rhonchi or rales.  Chest:     Chest wall: No tenderness.  Musculoskeletal:        General: Normal range of motion.     Cervical back: Normal range of motion and neck supple.  Skin:    General: Skin is warm and dry.     Capillary Refill: Capillary refill takes less than 2 seconds.     Coloration: Skin is not jaundiced or pale.     Findings: No bruising, erythema, lesion or rash.  Neurological:     General: No focal deficit present.     Mental Status: She is alert and oriented to person, place, and time. Mental status is at baseline.  Psychiatric:        Mood and Affect: Mood is anxious. Affect is tearful.        Speech: Speech normal.        Behavior: Behavior normal.        Thought Content: Thought content normal.        Cognition and Memory: Cognition and memory normal.        Judgment: Judgment normal.     Results for orders placed or performed in visit on 02/04/20  Microscopic Examination   BLD  Result Value Ref Range   WBC, UA 0-5 0 - 5 /hpf   RBC None seen 0 - 2 /hpf   Epithelial Cells (non renal) 0-10 0 - 10 /hpf   Bacteria, UA None seen None seen/Few  Bayer DCA Hb A1c Waived  Result Value Ref Range   HB A1C (BAYER DCA - WAIVED) 5.4 <7.0 %  CBC with Differential/Platelet  Result Value Ref Range   WBC 6.0 3.4 - 10.8 x10E3/uL   RBC 4.35 3.77 - 5.28 x10E6/uL   Hemoglobin 11.8 11.1 - 15.9 g/dL   Hematocrit 35.3 34.0 - 46.6 %   MCV 81 79 - 97 fL   MCH 27.1 26.6 - 33.0 pg   MCHC 33.4 31 - 35 g/dL   RDW 14.2 11.7 - 15.4 %   Platelets 273 150 - 450 x10E3/uL   Neutrophils 50 Not Estab. %   Lymphs 35 Not Estab. %   Monocytes 9 Not Estab. %   Eos 5 Not Estab. %   Basos 1 Not Estab. %   Neutrophils  Absolute 3.0 1 - 7 x10E3/uL   Lymphocytes Absolute 2.1 0 - 3 x10E3/uL   Monocytes Absolute 0.5 0 - 0 x10E3/uL   EOS (ABSOLUTE) 0.3 0.0 - 0.4 x10E3/uL   Basophils Absolute 0.0 0 - 0 x10E3/uL   Immature Granulocytes 0 Not Estab. %   Immature Grans (Abs) 0.0 0.0 - 0.1 x10E3/uL  Comprehensive metabolic panel  Result Value Ref Range   Glucose 81 65 - 99 mg/dL   BUN 9 6 - 24 mg/dL   Creatinine, Ser 0.83 0.57 - 1.00 mg/dL   GFR calc non Af Amer 84 >59 mL/min/1.73   GFR calc Af Amer 97 >59 mL/min/1.73   BUN/Creatinine Ratio 11 9 - 23   Sodium 142 134 - 144 mmol/L   Potassium 4.0 3.5 - 5.2 mmol/L   Chloride 102 96 - 106 mmol/L   CO2 25 20 - 29 mmol/L   Calcium 9.5 8.7 - 10.2 mg/dL   Total Protein 7.3 6.0 - 8.5 g/dL   Albumin 4.3 3.8 - 4.8 g/dL   Globulin, Total 3.0 1.5 - 4.5 g/dL   Albumin/Globulin Ratio 1.4 1.2 - 2.2   Bilirubin Total 0.3 0.0 - 1.2 mg/dL   Alkaline Phosphatase 88 39 - 117 IU/L   AST 15 0 - 40 IU/L   ALT 9 0 - 32 IU/L  Lipid Panel w/o Chol/HDL Ratio  Result Value Ref Range   Cholesterol, Total 200 (H) 100 - 199 mg/dL   Triglycerides 137 0 - 149 mg/dL   HDL 53 >39 mg/dL   VLDL Cholesterol Cal 24 5 - 40 mg/dL   LDL Chol Calc (NIH) 123 (H) 0 - 99 mg/dL  Microalbumin, Urine Waived  Result Value Ref Range   Microalb, Ur Waived 30 (H) 0 - 19 mg/L   Creatinine, Urine Waived 100 10 - 300 mg/dL   Microalb/Creat Ratio <30 <30 mg/g  TSH  Result Value Ref Range   TSH 1.510 0.450 - 4.500 uIU/mL  VITAMIN D 25 Hydroxy (Vit-D Deficiency, Fractures)  Result Value Ref Range   Vit D, 25-Hydroxy 38.5 30.0 - 100.0 ng/mL  Urinalysis, Routine w reflex microscopic  Result Value Ref Range   Specific Gravity, UA 1.020 1.005 - 1.030   pH, UA 7.0 5.0 - 7.5   Color, UA Yellow Yellow   Appearance Ur Clear Clear   Leukocytes,UA 1+ (A) Negative   Protein,UA Negative Negative/Trace   Glucose, UA Negative Negative   Ketones, UA Negative Negative   RBC, UA Negative Negative   Bilirubin,  UA Negative Negative   Urobilinogen, Ur 1.0 0.2 - 1.0 mg/dL   Nitrite, UA Negative Negative   Microscopic Examination See below:   B12  Result Value Ref Range   Vitamin B-12 1,112 232 - 1,245 pg/mL  HM COLONOSCOPY  Result Value Ref Range   HM Colonoscopy See Report (in chart) See Report (in chart), Patient Reported      Assessment & Plan:   Problem List Items Addressed This Visit      Cardiovascular and Mediastinum   Benign essential hypertension    Will increase her lisinopril-HCTZ to 20-25 while she is under this stress. Recheck 1 month. Call with any concerns.       Relevant Medications   lisinopril-hydrochlorothiazide (ZESTORETIC) 20-25 MG tablet     Other   Anxiety - Primary    In acute exacerbation due to social stresses. Will increase her lexapro to 10mg . Continue buspar. Will reach out to her counselor. Call with any concerns. Recheck 1 month.       Relevant Medications   escitalopram (LEXAPRO) 10 MG tablet    Other Visit Diagnoses    Psychophysiological insomnia       Will give short course of ambien due to extreme stress. Recheck 1 month. Call with any concerns.    Routine screening for STI (sexually transmitted infection)  Labs drawn today. Await results. Treat as needed.    Relevant Orders   Hepatitis, Acute   RPR   HIV Antibody (routine testing w rflx)   GC/Chlamydia Probe Amp   HSV(herpes simplex vrs) 1+2 ab-IgG   WET PREP FOR TRICH, YEAST, CLUE       Follow up plan: Return in about 4 weeks (around 04/19/2020).

## 2020-03-23 LAB — HEPATITIS PANEL, ACUTE
Hep A IgM: NEGATIVE
Hep B C IgM: NEGATIVE
Hep C Virus Ab: <0.1 s/co ratio (ref 0.0–0.9)
Hepatitis B Surface Ag: NEGATIVE

## 2020-03-23 LAB — HIV ANTIBODY (ROUTINE TESTING W REFLEX): HIV Screen 4th Generation wRfx: NONREACTIVE

## 2020-03-23 LAB — HSV(HERPES SIMPLEX VRS) I + II AB-IGG
HSV 1 Glycoprotein G Ab, IgG: <0.91 index (ref 0.00–0.90)
HSV 2 IgG, Type Spec: <0.91 index (ref 0.00–0.90)

## 2020-03-23 LAB — RPR: RPR Ser Ql: NONREACTIVE

## 2020-03-24 ENCOUNTER — Other Ambulatory Visit: Payer: Self-pay | Admitting: Family Medicine

## 2020-03-24 LAB — GC/CHLAMYDIA PROBE AMP
Chlamydia trachomatis, NAA: NEGATIVE
Neisseria Gonorrhoeae by PCR: NEGATIVE

## 2020-03-24 MED ORDER — METRONIDAZOLE 500 MG PO TABS: 500.0000 mg | ORAL_TABLET | Freq: Two times a day (BID) | ORAL | 0 refills | Status: DC

## 2020-04-19 ENCOUNTER — Other Ambulatory Visit: Payer: Self-pay

## 2020-04-19 ENCOUNTER — Encounter: Payer: Self-pay | Admitting: Family Medicine

## 2020-04-19 ENCOUNTER — Ambulatory Visit (INDEPENDENT_AMBULATORY_CARE_PROVIDER_SITE_OTHER): Payer: BLUE CROSS/BLUE SHIELD | Admitting: Family Medicine

## 2020-04-19 VITALS — BP 122/68 | HR 70 | Temp 98.0°F | Wt 177.8 lb

## 2020-04-19 DIAGNOSIS — I10 Essential (primary) hypertension: Secondary | ICD-10-CM | POA: Diagnosis not present

## 2020-04-19 DIAGNOSIS — F419 Anxiety disorder, unspecified: Secondary | ICD-10-CM

## 2020-04-19 DIAGNOSIS — F5104 Psychophysiologic insomnia: Secondary | ICD-10-CM

## 2020-04-19 MED ORDER — FLUCONAZOLE 150 MG PO TABS: 150.0000 mg | ORAL_TABLET | Freq: Every day | ORAL | 0 refills | Status: DC

## 2020-04-19 NOTE — Assessment & Plan Note (Signed)
Doing better with the higher dose of lexapro. Seeing her counselor. Feeling better. Continue to monitor. Call with any concerns.

## 2020-04-19 NOTE — Progress Notes (Signed)
BP 122/68 (BP Location: Left Arm, Patient Position: Sitting, Cuff Size: Normal)   Pulse 70   Temp 98 F (36.7 C) (Oral)   Wt 177 lb 12.8 oz (80.6 kg)   SpO2 100%   BMI 28.24 kg/m    Subjective:    Patient ID: Real Cons, female    DOB: 06-24-1971, 49 y.o.   MRN: [id number]  HPI: Tina Harvey is a 49 y.o. female  Chief Complaint  Patient presents with  . Anxiety  . Hypertension  . Insomnia   HYPERTENSION Hypertension status: controlled  Satisfied with current treatment? yes Duration of hypertension: chronic BP monitoring frequency:  not checking BP medication side effects:  no Medication compliance: excellent compliance Previous BP meds:lisinopril-HCTZ Aspirin: no Recurrent headaches: no Visual changes: no Palpitations: no Dyspnea: no Chest pain: no Lower extremity edema: no Dizzy/lightheaded: no  ANXIETY/STRESS Duration: Chronic, worse in the past few weeks Status:better Anxious mood: yes  Excessive worrying: yes Irritability: no  Sweating: no Nausea: no Palpitations:no Hyperventilation: no Panic attacks: yes Agoraphobia: no  Obscessions/compulsions: no Depressed mood: yes Depression screen [facility] 2/9 01/14/2020 06/22/2019 03/30/2018 02/12/2017 06/19/2015  Decreased Interest 2 0 0 1 0  Down, Depressed, Hopeless 1 1 0 1 0  PHQ - 2 Score 3 1 0 2 0  Altered sleeping 3 1 3  - -  Tired, decreased energy 2 1 3  - -  Change in appetite 0 0 0 - -  Feeling bad or failure about yourself  0 0 0 - -  Trouble concentrating 1 0 2 - -  Moving slowly or fidgety/restless 0 0 0 - -  Suicidal thoughts 0 0 0 - -  PHQ-9 Score 9 3 8  - -  Difficult doing work/chores Somewhat difficult Somewhat difficult Somewhat difficult - -   Anhedonia: no Weight changes: no Insomnia: yes hard to fall asleep  Hypersomnia: no Fatigue/loss of energy: yes Feelings of worthlessness: no Feelings of guilt: no Impaired concentration/indecisiveness: no Suicidal ideations: no  Crying spells:  yes Recent Stressors/Life Changes: yes   Relationship problems: yes   Family stress: yes     Financial stress: yes    Job stress: yes    Recent death/loss: no  INSOMNIA Duration: weeks Satisfied with sleep quality: yes Difficulty falling asleep: yes Difficulty staying asleep: no Waking a few hours after sleep onset: no Early morning awakenings: no Daytime hypersomnolence: no Wakes feeling refreshed: no Good sleep hygiene: yes Apnea: no Snoring: no Depressed/anxious mood: yes Recent stress: yes Restless legs/nocturnal leg cramps: no Chronic pain/arthritis: no History of sleep study: no Treatments attempted: melatonin, uinsom, benadryl and ambien    Relevant past medical, surgical, family and social history reviewed and updated as indicated. Interim medical history since our last visit reviewed. Allergies and medications reviewed and updated.  Review of Systems  Constitutional: Negative.   Respiratory: Negative.   Cardiovascular: Negative.   Gastrointestinal: Negative.   Musculoskeletal: Negative.   Psychiatric/Behavioral: Negative.     Per HPI unless specifically indicated above     Objective:    BP 122/68 (BP Location: Left Arm, Patient Position: Sitting, Cuff Size: Normal)   Pulse 70   Temp 98 F (36.7 C) (Oral)   Wt 177 lb 12.8 oz (80.6 kg)   SpO2 100%   BMI 28.24 kg/m   Wt Readings from Last 3 Encounters:  04/19/20 177 lb 12.8 oz (80.6 kg)  03/22/20 180 lb 9.6 oz (81.9 kg)  02/04/20 181 lb 9.6 oz (82.4 kg)  Physical Exam Vitals and nursing note reviewed.  Constitutional:      General: She is not in acute distress.    Appearance: Normal appearance. She is not ill-appearing, toxic-appearing or diaphoretic.  HENT:     Head: Normocephalic and atraumatic.     Right Ear: External ear normal.     Left Ear: External ear normal.     Nose: Nose normal.     Mouth/Throat:     Mouth: Mucous membranes are moist.     Pharynx: Oropharynx is clear.  Eyes:      General: No scleral icterus.       Right eye: No discharge.        Left eye: No discharge.     Extraocular Movements: Extraocular movements intact.     Conjunctiva/sclera: Conjunctivae normal.     Pupils: Pupils are equal, round, and reactive to light.  Cardiovascular:     Rate and Rhythm: Normal rate and regular rhythm.     Pulses: Normal pulses.     Heart sounds: Normal heart sounds. No murmur heard.  No friction rub. No gallop.   Pulmonary:     Effort: Pulmonary effort is normal. No respiratory distress.     Breath sounds: Normal breath sounds. No stridor. No wheezing, rhonchi or rales.  Chest:     Chest wall: No tenderness.  Musculoskeletal:        General: Normal range of motion.     Cervical back: Normal range of motion and neck supple.  Skin:    General: Skin is warm and dry.     Capillary Refill: Capillary refill takes less than 2 seconds.     Coloration: Skin is not jaundiced or pale.     Findings: No bruising, erythema, lesion or rash.  Neurological:     General: No focal deficit present.     Mental Status: She is alert and oriented to person, place, and time. Mental status is at baseline.  Psychiatric:        Mood and Affect: Mood normal.        Behavior: Behavior normal.        Thought Content: Thought content normal.        Judgment: Judgment normal.     Results for orders placed or performed in visit on 03/22/20  GC/Chlamydia Probe Amp   Specimen: Urine   UR  Result Value Ref Range   Chlamydia trachomatis, NAA Negative Negative   Neisseria Gonorrhoeae by PCR Negative Negative  WET PREP FOR TRICH, YEAST, CLUE   Specimen: Vaginal; Sterile Swab   Sterile Swab  Result Value Ref Range   Trichomonas Exam Negative Negative   Yeast Exam Negative Negative   Clue Cell Exam Positive (A) Negative  Hepatitis, Acute  Result Value Ref Range   Hep A IgM Negative Negative   Hepatitis B Surface Ag Negative Negative   Hep B C IgM Negative Negative   Hep C Virus Ab  <0.1 0.0 - 0.9 s/co ratio  RPR  Result Value Ref Range   RPR Ser Ql Non Reactive Non Reactive  HIV Antibody (routine testing w rflx)  Result Value Ref Range   HIV Screen 4th Generation wRfx Non Reactive Non Reactive  HSV(herpes simplex vrs) 1+2 ab-IgG  Result Value Ref Range   HSV 1 Glycoprotein G Ab, IgG <0.91 0.00 - 0.90 index   HSV 2 IgG, Type Spec <0.91 0.00 - 0.90 index      Assessment & Plan:   Problem  List Items Addressed This Visit      Cardiovascular and Mediastinum   Benign essential hypertension    Under good control on current regimen. Continue current regimen. Continue to monitor. Call with any concerns. Refills given. Labs drawn today.       Relevant Orders   Basic metabolic panel     Other   Anxiety - Primary    Doing better with the higher dose of lexapro. Seeing her counselor. Feeling better. Continue to monitor. Call with any concerns.        Other Visit Diagnoses    Psychophysiological insomnia       Tolerating her ambien well. Using it occasionally. Continue to monitor. Call with any concerns.        Follow up plan: Return in about 6 months (around 10/20/2020).

## 2020-04-19 NOTE — Assessment & Plan Note (Signed)
Under good control on current regimen. Continue current regimen. Continue to monitor. Call with any concerns. Refills given. Labs drawn today.   

## 2020-04-20 LAB — BASIC METABOLIC PANEL
BUN/Creatinine Ratio: 17 (ref 9–23)
BUN: 16 mg/dL (ref 6–24)
CO2: 29 mmol/L (ref 20–29)
Calcium: 9.7 mg/dL (ref 8.7–10.2)
Chloride: 101 mmol/L (ref 96–106)
Creatinine, Ser: 0.96 mg/dL (ref 0.57–1.00)
GFR calc Af Amer: 81 mL/min/{1.73_m2} (ref >59)
GFR calc non Af Amer: 71 mL/min/{1.73_m2} (ref >59)
Glucose: 77 mg/dL (ref 65–99)
Potassium: 3.7 mmol/L (ref 3.5–5.2)
Sodium: 142 mmol/L (ref 134–144)

## 2020-04-22 ENCOUNTER — Other Ambulatory Visit: Payer: Self-pay | Admitting: Family Medicine

## 2020-05-01 ENCOUNTER — Other Ambulatory Visit: Payer: Self-pay | Admitting: Family Medicine

## 2020-05-01 NOTE — Telephone Encounter (Signed)
Requested medication (s) are due for refill today - unsure- labs levels normal  Requested medication (s) are on the active medication list -yes  Future visit scheduled -yes  Last refill: 12/30/18 #44  Notes to clinic:request for non delegated Rx refill  Requested Prescriptions  Pending Prescriptions Disp Refills   Vitamin D, Ergocalciferol, (DRISDOL) 1.25 MG (50000 UNIT) CAPS capsule [Pharmacy Med Name: Vitamin D (Ergocalciferol) 50000 UNIT Oral Capsule] 4 capsule 0    Sig: Take 1 capsule by mouth once a week      Endocrinology:  Vitamins - Vitamin D Supplementation Failed - 05/01/2020  4:28 PM      Failed - 50,000 IU strengths are not delegated      Failed - Phosphate in normal range and within 360 days    Phosphorus  Date Value Ref Range Status  10/10/2014 3.3 2.5 - 4.9 mg/dL Final          Passed - Ca in normal range and within 360 days    Calcium  Date Value Ref Range Status  04/19/2020 9.7 8.7 - 10.2 mg/dL Final   Calcium, Total  Date Value Ref Range Status  10/10/2014 9.2 8.5 - 10.1 mg/dL Final   Calcium, Ion  Date Value Ref Range Status  07/12/2008 1.20  Final          Passed - Vitamin D in normal range and within 360 days    Vit D, 1,25-Dihydroxy  Date Value Ref Range Status  04/06/2015 57.7 19.9 - 79.3 pg/mL Final   Vit D, 25-Hydroxy  Date Value Ref Range Status  02/04/2020 38.5 30.0 - 100.0 ng/mL Final    Comment:    Vitamin D deficiency has been defined by the Institute of Medicine and an Endocrine Society practice guideline as a level of serum 25-OH vitamin D less than 20 ng/mL (1,2). The Endocrine Society went on to further define vitamin D insufficiency as a level between 21 and 29 ng/mL (2). 1. IOM (Institute of Medicine). 2010. Dietary reference    intakes for calcium and D. [place]: The    [employer]. 2. Holick MF, Binkley [place], Bischoff-Ferrari HA, et al.    Evaluation, treatment, and prevention of vitamin D    deficiency: an  Endocrine Society clinical practice    guideline. JCEM. 2011 Jul; 96(7):1911-30.           Passed - Valid encounter within last 12 months    Recent Outpatient Visits           1 week ago [place], [place], DO   1 month ago [place], Megan P, DO   2 months ago Routine general medical examination at a health care facility   [facility] [place], [place], DO   3 months ago [place], Megan P, DO   10 months ago Vitamin B 12 deficiency   [place], DO       Future Appointments             In 3 months Johnson, Barb Merino, DO [facility], [facility]   In 5 months [name], Barb Merino, DO [facility], [facility]                Requested Prescriptions  Pending Prescriptions Disp Refills   Vitamin D, Ergocalciferol, (DRISDOL) 1.25 MG (50000 UNIT) CAPS capsule [Pharmacy Med Name: Vitamin  D (Ergocalciferol) 50000 UNIT Oral Capsule] 4 capsule 0    Sig: Take 1 capsule by mouth once a week      Endocrinology:  Vitamins - Vitamin D Supplementation Failed - 05/01/2020  4:28 PM      Failed - 50,000 IU strengths are not delegated      Failed - Phosphate in normal range and within 360 days    Phosphorus  Date Value Ref Range Status  10/10/2014 3.3 2.5 - 4.9 mg/dL Final          Passed - Ca in normal range and within 360 days    Calcium  Date Value Ref Range Status  04/19/2020 9.7 8.7 - 10.2 mg/dL Final   Calcium, Total  Date Value Ref Range Status  10/10/2014 9.2 8.5 - 10.1 mg/dL Final   Calcium, Ion  Date Value Ref Range Status  07/12/2008 1.20  Final          Passed - Vitamin D in normal range and within 360 days    Vit D, 1,25-Dihydroxy  Date Value Ref Range Status  04/06/2015 57.7 19.9 - 79.3 pg/mL Final   Vit D, 25-Hydroxy  Date Value Ref Range Status  02/04/2020 38.5 30.0 - 100.0 ng/mL Final    Comment:     Vitamin D deficiency has been defined by the Institute of Medicine and an Endocrine Society practice guideline as a level of serum 25-OH vitamin D less than 20 ng/mL (1,2). The Endocrine Society went on to further define vitamin D insufficiency as a level between 21 and 29 ng/mL (2). 1. IOM (Institute of Medicine). 2010. Dietary reference    intakes for calcium and D. [place]: The    [employer]. 2. Holick MF, Binkley [place], Bischoff-Ferrari HA, et al.    Evaluation, treatment, and prevention of vitamin D    deficiency: an Endocrine Society clinical practice    guideline. JCEM. 2011 Jul; 96(7):1911-30.           Passed - Valid encounter within last 12 months    Recent Outpatient Visits           1 week ago [place], [place], DO   1 month ago [place], Megan P, DO   2 months ago Routine general medical examination at a health care facility   [facility], [place], DO   3 months ago [place], Megan P, DO   10 months ago Vitamin B 12 deficiency   [employer], [place], DO       Future Appointments             In 3 months [name], Barb Merino, DO [facility], [place]   In 5 months [name], Barb Merino, DO [facility], [facility]

## 2020-05-02 NOTE — Telephone Encounter (Signed)
Routing to provider  

## 2020-06-14 ENCOUNTER — Other Ambulatory Visit: Payer: Self-pay | Admitting: Family Medicine

## 2020-06-14 NOTE — Telephone Encounter (Signed)
Routing to provider  

## 2020-06-14 NOTE — Telephone Encounter (Signed)
Requested medication (s) are due for refill today: yes   Requested medication (s) are on the active medication list: yes   Last refill:  05/01/2020  Future visit scheduled: yes   Notes to clinic:   Medication not assigned to a protocol, review manually.   Valid encounter within last 12 months     Requested Prescriptions  Pending Prescriptions Disp Refills   cyanocobalamin (,VITAMIN B-12,) 1000 MCG/ML injection [Pharmacy Med Name: Cyanocobalamin 1000 MCG/ML Injection Solution] 2 mL 0    Sig: INJECT 1 ML INTO THE MUSCLE EVERY 14 DAYS      Off-Protocol Failed - 06/14/2020  2:20 PM      Failed - Medication not assigned to a protocol, review manually.      Passed - Valid encounter within last 12 months    Recent Outpatient Visits           1 month ago [place], [place], DO   2 months ago [place], Megan P, DO   4 months ago Routine general medical examination at a health care facility   [facility], [place], DO   5 months ago [place], [place], DO   11 months ago Vitamin B 12 deficiency   [place], DO       Future Appointments             In 1 month [name], Barb Merino, DO [facility], [place]   In 4 months Johnson, Megan P, DO [place], [facility]           Off-Protocol Failed - 06/14/2020  2:20 PM      Failed - Medication not assigned to a protocol, review manually.      Passed - Valid encounter within last 12 months    Recent Outpatient Visits           1 month ago [place], [place], DO   2 months ago [place], Megan P, DO   4 months ago Routine general medical examination at a health care facility   [facility], [place], DO   5 months ago [place], Megan P, DO   11  months ago Vitamin B 12 deficiency   [place], DO       Future Appointments             In 1 month Johnson, Megan P, DO [facility], [place]   In 4 months Johnson, Megan P, DO [place], [facility]              zolpidem (AMBIEN) 5 MG tablet [employer] Med Name: Zolpidem Tartrate 5 MG Oral Tablet] 30 tablet 0    Sig: TAKE 1 TO 2 TABLETS BY MOUTH AT BEDTIME AS NEEDED FOR SLEEP      Not Delegated - Psychiatry:  Anxiolytics/Hypnotics Failed - 06/14/2020  2:20 PM      Failed - This refill cannot be delegated      Failed - Urine Drug Screen completed in last 360 days.      Passed - Valid encounter within last 6 months    Recent Outpatient Visits           1 month ago [facility] [place], Megan P, DO   2 months ago [place], Megan P, DO   4 months ago Routine general medical examination at a health care facility   [place], [place], DO   5 months ago [place], [place], DO   11 months ago Vitamin B 12 deficiency   [place], DO       Future Appointments             In 1 month Johnson, Barb Merino, DO [facility], [place]   In 4 months Johnson, Barb Merino, DO [facility], [facility]

## 2020-06-19 NOTE — Telephone Encounter (Signed)
12 refills of b12 given in June. Not due. Needs appt for [facility]

## 2020-06-19 NOTE — Telephone Encounter (Signed)
Pt stated she would call pharmacy for her B12 and that she did not want a refill of Ambien just yet, Pharmacy sent that, Pt states she would call back if she needs [place].

## 2020-08-08 ENCOUNTER — Other Ambulatory Visit: Payer: Self-pay

## 2020-08-08 ENCOUNTER — Encounter: Payer: Self-pay | Admitting: Family Medicine

## 2020-08-08 ENCOUNTER — Telehealth: Payer: Self-pay

## 2020-08-08 ENCOUNTER — Ambulatory Visit: Payer: BLUE CROSS/BLUE SHIELD | Admitting: Family Medicine

## 2020-08-08 VITALS — BP 122/79 | HR 65 | Temp 98.3°F | Wt 172.2 lb

## 2020-08-08 DIAGNOSIS — E538 Deficiency of other specified B group vitamins: Secondary | ICD-10-CM | POA: Diagnosis not present

## 2020-08-08 DIAGNOSIS — I10 Essential (primary) hypertension: Secondary | ICD-10-CM

## 2020-08-08 DIAGNOSIS — E559 Vitamin D deficiency, unspecified: Secondary | ICD-10-CM

## 2020-08-08 DIAGNOSIS — K219 Gastro-esophageal reflux disease without esophagitis: Secondary | ICD-10-CM

## 2020-08-08 DIAGNOSIS — E782 Mixed hyperlipidemia: Secondary | ICD-10-CM

## 2020-08-08 DIAGNOSIS — M109 Gout, unspecified: Secondary | ICD-10-CM

## 2020-08-08 DIAGNOSIS — M7712 Lateral epicondylitis, left elbow: Secondary | ICD-10-CM | POA: Diagnosis not present

## 2020-08-08 DIAGNOSIS — M1991 Primary osteoarthritis, unspecified site: Secondary | ICD-10-CM

## 2020-08-08 DIAGNOSIS — M8000XS Age-related osteoporosis with current pathological fracture, unspecified site, sequela: Secondary | ICD-10-CM

## 2020-08-08 DIAGNOSIS — F419 Anxiety disorder, unspecified: Secondary | ICD-10-CM

## 2020-08-08 MED ORDER — PANTOPRAZOLE SODIUM 40 MG PO TBEC: 40.0000 mg | DELAYED_RELEASE_TABLET | Freq: Every day | ORAL | 0 refills | Status: DC

## 2020-08-08 MED ORDER — ESCITALOPRAM OXALATE 10 MG PO TABS
10.0000 mg | ORAL_TABLET | Freq: Every day | ORAL | 1 refills | Status: DC
Start: 2020-08-08 — End: 2021-02-07

## 2020-08-08 MED ORDER — ATORVASTATIN CALCIUM 10 MG PO TABS
10.0000 mg | ORAL_TABLET | Freq: Every day | ORAL | 1 refills | Status: DC
Start: 2020-08-08 — End: 2021-02-07

## 2020-08-08 MED ORDER — GABAPENTIN 100 MG PO CAPS
100.0000 mg | ORAL_CAPSULE | Freq: Every day | ORAL | 1 refills | Status: DC
Start: 2020-08-08 — End: 2021-02-07

## 2020-08-08 MED ORDER — LISINOPRIL-HYDROCHLOROTHIAZIDE 20-25 MG PO TABS: 1.0000 | ORAL_TABLET | Freq: Every day | ORAL | 1 refills | Status: DC

## 2020-08-08 MED ORDER — IBANDRONATE SODIUM 150 MG PO TABS: 150.0000 mg | ORAL_TABLET | ORAL | 4 refills | Status: DC

## 2020-08-08 MED ORDER — BUSPIRONE HCL 5 MG PO TABS
5.0000 mg | ORAL_TABLET | Freq: Three times a day (TID) | ORAL | 6 refills | Status: DC
Start: 2020-08-08 — End: 2021-02-07

## 2020-08-08 NOTE — Telephone Encounter (Signed)
PA for Pantoprazole initiated and approved via Cover My Meds. Key: [id number]

## 2020-08-08 NOTE — Assessment & Plan Note (Signed)
Rechecking labs today. Await results. Treat as needed.  °

## 2020-08-08 NOTE — Assessment & Plan Note (Signed)
Under good control on current regimen. Continue current regimen. Continue to monitor. Call with any concerns. Refills given. Labs drawn today.   

## 2020-08-08 NOTE — Patient Instructions (Addendum)
Tennis Elbow  Tennis elbow (lateral epicondylitis) is inflammation of tendons in your outer forearm, near your elbow. Tendons are tissues that connect muscle to bone. When you have tennis elbow, inflammation affects the tendons that you use to bend your wrist and move your hand up. Inflammation occurs in the lower part of the upper arm bone (humerus), where the tendons connect to the bone (lateral epicondyle). Tennis elbow often affects people who play tennis, but anyone may get the condition from repeatedly extending the wrist or turning the forearm. What are the causes? This condition is usually caused by repeatedly extending the wrist, turning the forearm, and using the hands. It can result from sports or work that requires repetitive forearm movements. In some cases, it may be caused by a sudden injury. What increases the risk? You are more likely to develop tennis elbow if you play tennis or another racket sport. You also have a higher risk if you frequently use your hands for work. Besides people who play tennis, others at greater risk include:  Musicians.  Carpenters, painters, and plumbers.  Cooks.  Cashiers.  People who work in [employer].  Architect workers.  Butchers.  People who use computers. What are the signs or symptoms? Symptoms of this condition include:  Pain and tenderness in the forearm and the outer part of the elbow. Pain may be felt only when using the arm, or it may be there all the time.  A burning feeling that starts in the elbow and spreads down the forearm.  A weak grip in the hand. How is this diagnosed? This condition may be diagnosed based on:  Your symptoms and medical history.  A physical exam.  X-rays.  MRI. How is this treated? Resting and icing your arm is often the first treatment. Your health care provider may also recommend:  Medicines to reduce pain and inflammation. These may be in the form of a pill, topical gels, or shots of a  steroid medicine (cortisone).  An elbow strap to reduce stress on the area.  Physical therapy. This may include massage or exercises.  An elbow brace to restrict the movements that cause symptoms. If these treatments do not help relieve your symptoms, your health care provider may recommend surgery to remove damaged muscle and reattach healthy muscle to bone. Follow these instructions at home: Activity  Rest your elbow and wrist and avoid activities that cause symptoms, as told by your health care provider.  Do physical therapy exercises as instructed.  If you lift an object, lift it with your palm facing up. This reduces stress on your elbow. Lifestyle  If your tennis elbow is caused by sports, check your equipment and make sure that: ? You are using it correctly. ? It is the best fit for you.  If your tennis elbow is caused by work or computer use, take frequent breaks to stretch your arm. Talk with your manager about ways to manage your condition at work. If you have a brace:  Wear the brace or strap as told by your health care provider. Remove it only as told by your health care provider.  Loosen the brace if your fingers tingle, become numb, or turn cold and blue.  Keep the brace clean.  If the brace is not waterproof, ask if you may remove it for bathing. If you must keep the brace on while bathing: ? Do not let it get wet. ? Cover it with a watertight covering when you take a  bath or a shower. General instructions   If directed, put ice on the painful area: ? Put ice in a plastic bag. ? Place a towel between your skin and the bag. ? Leave the ice on for 20 minutes, 2-3 times a day.  Take over-the-counter and prescription medicines only as told by your health care provider.  Keep all follow-up visits as told by your health care provider. This is important. Contact a health care provider if:  You have pain that gets worse or does not get better with  treatment.  You have numbness or weakness in your forearm, hand, or fingers. Summary  Tennis elbow (lateral epicondylitis) is inflammation of tendons in your outer forearm, near your elbow.  Common symptoms include pain and tenderness in your forearm and the outer part of your elbow.  This condition is usually caused by repeatedly extending your wrist, turning your forearm, and using your hands.  The first treatment is often resting and icing your arm to relieve symptoms. Further treatment may include taking medicine, getting physical therapy, wearing a brace or strap, or having surgery. This information is not intended to replace advice given to you by your health care provider. Make sure you discuss any questions you have with your health care provider. Document Revised: 06/12/2018 Document Reviewed: 07/01/2017 Elsevier Patient Education  [place] Ask your health care provider which exercises are safe for you. Do exercises exactly as told by your health care provider and adjust them as directed. It is normal to feel mild stretching, pulling, tightness, or discomfort as you do these exercises. Stop right away if you feel sudden pain or your pain gets worse. Do not begin these exercises until told by your health care provider. Stretching and range-of-motion exercises These exercises warm up your muscles and joints and improve the movement and flexibility of your elbow. These exercises also help to relieve pain, numbness, and tingling. Wrist flexion, assisted  1. Straighten your left / right elbow in front of you with your palm facing down toward the floor. ? If told by your health care provider, bend your left / right elbow to a 90-degree angle (right angle) at your side. 2. With your other hand, gently push over the back of your left / right hand so your fingers point toward the floor (flexion). Stop when you feel a gentle stretch on the back of your  forearm. 3. Hold this position for __________ seconds. Repeat __________ times. Complete this exercise __________ times a day. Wrist extension, assisted  1. Straighten your left / right elbow in front of you with your palm facing up toward the ceiling. ? If told by your health care provider, bend your left / right elbow to a 90-degree angle (right angle) at your side. 2. With your other hand, gently pull your left / right hand and fingers toward the floor (extension). Stop when you feel a gentle stretch on the palm side of your forearm. 3. Hold this position for __________ seconds. Repeat __________ times. Complete this exercise __________ times a day. Assisted forearm rotation, supination 1. Sit or stand with your left / right elbow bent to a 90-degree angle (right angle) at your side. 2. Using your uninjured hand, turn (rotate) your left / right palm up toward the ceiling (supination) until you feel a gentle stretch along the inside of your forearm. 3. Hold this position for __________ seconds. Repeat __________ times. Complete this exercise __________ times a day.  Assisted forearm rotation, pronation 1. Sit or stand with your left / right elbow bent to a 90-degree angle (right angle) at your side. 2. Using your uninjured hand, rotate your left / right palm down toward the floor (pronation) until you feel a gentle stretch along the outside of your forearm. 3. Hold this position for __________ seconds. Repeat __________ times. Complete this exercise __________ times a day. Strengthening exercises These exercises build strength and endurance in your forearm and elbow. Endurance is the ability to use your muscles for a long time, even after they get tired. Radial deviation  1. Stand with a __________ weight or a hammer in your left / right hand. Or, sit while holding a rubber exercise band or tubing, with your left / right forearm supported on a table or countertop. ? If you are standing,  position your forearm so that your thumb is facing forward. If you are sitting, position your forearm so that the thumb is facing the ceiling. This is the neutral position. 2. Raise your hand upward in front of you so your thumb moves toward the ceiling (radial deviation), or pull up on the rubber tubing. Keep your forearm and elbow still while you move your wrist only. 3. Hold this position for __________ seconds. 4. Slowly return to the starting position. Repeat __________ times. Complete this exercise __________ times a day. Wrist extension, eccentric 1. Sit with your left / right forearm palm-down and supported on a table or other surface. Let your left / right wrist extend over the edge of the surface. 2. Hold a __________ weight or a piece of exercise band or tubing in your left / right hand. ? If using a rubber exercise band or tubing, hold the other end of the tubing with your other hand. 3. Use your uninjured hand to move your left / right hand up toward the ceiling. 4. Take your uninjured hand away and slowly return to the starting position using only your left / right hand. Lowering your arm under tension is called eccentric extension. Repeat __________ times. Complete this exercise __________ times a day. Wrist extension Do not do this exercise if it causes pain at the outside of your elbow. Only do this exercise once instructed by your health care provider. 1. Sit with your left / right forearm supported on a table or other surface and your palm turned down toward the floor. Let your left / right wrist extend over the edge of the surface. 2. Hold a __________ weight or a piece of rubber exercise band or tubing. ? If you are using a rubber exercise band or tubing, hold the band or tubing in place with your other hand to provide resistance. 3. Slowly bend your wrist so your hand moves up toward the ceiling (extension). Move only your wrist, keeping your forearm and elbow still. 4. Hold  this position for __________ seconds. 5. Slowly return to the starting position. Repeat __________ times. Complete this exercise __________ times a day. Forearm rotation, supination To do this exercise, you will need a lightweight hammer or rubber mallet. 1. Sit with your left / right forearm supported on a table or other surface. Bend your elbow to a 90-degree angle (right angle). Position your forearm so that your palm is facing down toward the floor, with your hand resting over the edge of the table. 2. Hold a hammer in your left / right hand. ? To make this exercise easier, hold the hammer near the head  of the hammer. ? To make this exercise harder, hold the hammer near the end of the handle. 3. Without moving your wrist or elbow, slowly rotate your forearm so your palm faces up toward the ceiling (supination). 4. Hold this position for __________ seconds. 5. Slowly return to the starting position. Repeat __________ times. Complete this exercise __________ times a day. Shoulder blade squeeze 1. Sit in a stable chair or stand with good posture. If you are sitting down, do not let your back touch the back of the chair. 2. Your arms should be at your sides with your elbows bent to a 90-degree angle (right angle). Position your forearms so that your thumbs are facing the ceiling (neutral position). 3. Without lifting your shoulders up, squeeze your shoulder blades tightly together. 4. Hold this position for __________ seconds. 5. Slowly release and return to the starting position. Repeat __________ times. Complete this exercise __________ times a day. This information is not intended to replace advice given to you by your health care provider. Make sure you discuss any questions you have with your health care provider. Document Revised: 01/07/2019 Document Reviewed: 11/10/2018 Elsevier Patient Education  [place].

## 2020-08-08 NOTE — Progress Notes (Signed)
BP 122/79 (BP Location: Left Arm)   Pulse 65   Temp 98.3 F (36.8 C)   Wt 172 lb 3.2 oz (78.1 kg)   SpO2 98%   BMI 27.35 kg/m    Subjective:    Patient ID: Tina Harvey, female    DOB: 1970/12/13, 49 y.o.   MRN: [id number]  HPI: Tina Harvey is a 49 y.o. female  Chief Complaint  Patient presents with  . Hypertension  . Anxiety   HYPERTENSION / HYPERLIPIDEMIA Satisfied with current treatment? yes Duration of hypertension: chronic BP monitoring frequency: not checking BP medication side effects: no Past BP meds: lisinopril-HCTZ Duration of hyperlipidemia: chronic Cholesterol medication side effects: no Cholesterol supplements: none Past cholesterol medications: atorvastatin Medication compliance: excellent compliance Recent stressors: yes Recurrent headaches: no Visual changes: no Palpitations: no Dyspnea: no Chest pain: no Lower extremity edema: no Dizzy/lightheaded: no  GERD GERD control status: controlled Satisfied with current treatment? no Heartburn frequency:  Medication side effects: no  Medication compliance: excellent Dysphagia: no Odynophagia:  no Hematemesis: no Blood in stool: no EGD: no  ANXIETY/STRESS Duration: chronic Status:controlled Anxious mood: no  Excessive worrying: no Irritability: no  Sweating: no Nausea: no Palpitations:no Hyperventilation: no Panic attacks: no Agoraphobia: no  Obscessions/compulsions: no Depressed mood: no Depression screen [facility] 2/9 08/08/2020 08/08/2020 01/14/2020 06/22/2019 03/30/2018  Decreased Interest 1 1 2  0 0  Down, Depressed, Hopeless 0 0 1 1 0  PHQ - 2 Score 1 1 3 1  0  Altered sleeping 0 - 3 1 3   Tired, decreased energy 1 - 2 1 3   Change in appetite 0 - 0 0 0  Feeling bad or failure about yourself  0 - 0 0 0  Trouble concentrating 0 - 1 0 2  Moving slowly or fidgety/restless 0 - 0 0 0  Suicidal thoughts 0 - 0 0 0  PHQ-9 Score 2 - 9 3 8   Difficult doing work/chores Not difficult at all -  Somewhat difficult Somewhat difficult Somewhat difficult   GAD 7 : Generalized Anxiety Score 08/08/2020 01/14/2020 06/22/2019 05/17/2019  Nervous, Anxious, on Edge 0 2 1 3   Control/stop worrying 0 1 0 2  Worry too much - different things 0 1 0 2  Trouble relaxing 0 3 1 2   Restless 1 2 0 1  Easily annoyed or irritable 1 2 1 2   Afraid - awful might happen 0 0 0 0  Total GAD 7 Score 2 11 3 12   Anxiety Difficulty Not difficult at all Somewhat difficult Somewhat difficult Very difficult   Anhedonia: no Weight changes: no Insomnia: no   Hypersomnia: no Fatigue/loss of energy: yes Feelings of worthlessness: no Feelings of guilt: no Impaired concentration/indecisiveness: no Suicidal ideations: no  Crying spells: no Recent Stressors/Life Changes: yes   Relationship problems: yes   Family stress: yes     Financial stress: no    Job stress: yes    Recent death/loss: no  ARM PAIN Duration: about a month Location: left Mechanism of injury: unknown Onset: gradual Severity: moderate  Quality:  Aching and sore Frequency: constant Radiation: no Aggravating factors: lifting   Alleviating factors: nothing  Status: worse Treatments attempted: rest, ice and heat  Relief with NSAIDs?:  mild Swelling: no Redness: no  Warmth: no Trauma: no Chest pain: no  Shortness of breath: no  Fever: no Decreased sensation: no Paresthesias: no Weakness: no   Relevant past medical, surgical, family and social history reviewed and updated as indicated. Interim  medical history since our last visit reviewed. Allergies and medications reviewed and updated.  Review of Systems  Constitutional: Negative.   Respiratory: Negative.   Cardiovascular: Negative.   Gastrointestinal: Negative.   Musculoskeletal: Negative.   Neurological: Negative.   Psychiatric/Behavioral: Negative.     Per HPI unless specifically indicated above     Objective:    BP 122/79 (BP Location: Left Arm)   Pulse 65   Temp  98.3 F (36.8 C)   Wt 172 lb 3.2 oz (78.1 kg)   SpO2 98%   BMI 27.35 kg/m   Wt Readings from Last 3 Encounters:  08/08/20 172 lb 3.2 oz (78.1 kg)  04/19/20 177 lb 12.8 oz (80.6 kg)  03/22/20 180 lb 9.6 oz (81.9 kg)    Physical Exam Vitals and nursing note reviewed.  Constitutional:      General: She is not in acute distress.    Appearance: Normal appearance. She is not ill-appearing, toxic-appearing or diaphoretic.  HENT:     Head: Normocephalic and atraumatic.     Right Ear: External ear normal.     Left Ear: External ear normal.     Nose: Nose normal.     Mouth/Throat:     Mouth: Mucous membranes are moist.     Pharynx: Oropharynx is clear.  Eyes:     General: No scleral icterus.       Right eye: No discharge.        Left eye: No discharge.     Extraocular Movements: Extraocular movements intact.     Conjunctiva/sclera: Conjunctivae normal.     Pupils: Pupils are equal, round, and reactive to light.  Cardiovascular:     Rate and Rhythm: Normal rate and regular rhythm.     Pulses: Normal pulses.     Heart sounds: Normal heart sounds. No murmur heard.  No friction rub. No gallop.   Pulmonary:     Effort: Pulmonary effort is normal. No respiratory distress.     Breath sounds: Normal breath sounds. No stridor. No wheezing, rhonchi or rales.  Chest:     Chest wall: No tenderness.  Musculoskeletal:        General: Normal range of motion.     Cervical back: Normal range of motion and neck supple.  Skin:    General: Skin is warm and dry.     Capillary Refill: Capillary refill takes less than 2 seconds.     Coloration: Skin is not jaundiced or pale.     Findings: No bruising, erythema, lesion or rash.  Neurological:     General: No focal deficit present.     Mental Status: She is alert and oriented to person, place, and time. Mental status is at baseline.  Psychiatric:        Mood and Affect: Mood normal.        Behavior: Behavior normal.        Thought Content:  Thought content normal.        Judgment: Judgment normal.     Results for orders placed or performed in visit on 44/01/02  Basic metabolic panel  Result Value Ref Range   Glucose 77 65 - 99 mg/dL   BUN 16 6 - 24 mg/dL   Creatinine, Ser 0.96 0.57 - 1.00 mg/dL   GFR calc non Af Amer 71 >59 mL/min/1.73   GFR calc Af Amer 81 >59 mL/min/1.73   BUN/Creatinine Ratio 17 9 - 23   Sodium 142 134 - 144 mmol/L  Potassium 3.7 3.5 - 5.2 mmol/L   Chloride 101 96 - 106 mmol/L   CO2 29 20 - 29 mmol/L   Calcium 9.7 8.7 - 10.2 mg/dL      Assessment & Plan:   Problem List Items Addressed This Visit      Cardiovascular and Mediastinum   Benign essential hypertension - Primary    Under good control on current regimen. Continue current regimen. Continue to monitor. Call with any concerns. Refills given. Labs drawn today.       Relevant Medications   lisinopril-hydrochlorothiazide (ZESTORETIC) 20-25 MG tablet   atorvastatin (LIPITOR) 10 MG tablet   Other Relevant Orders   Comprehensive metabolic panel     Digestive   Gastroesophageal reflux disease    Under good control on current regimen. Continue current regimen. Continue to monitor. Call with any concerns. Refills given. Labs drawn today.       Relevant Medications   pantoprazole (PROTONIX) 40 MG tablet   Other Relevant Orders   CBC with Differential/Platelet   Comprehensive metabolic panel     Musculoskeletal and Integument   Osteoporosis    Under good control on current regimen. Continue current regimen. Continue to monitor. Call with any concerns. Refills given. Labs drawn today.       Relevant Medications   ibandronate (BONIVA) 150 MG tablet     Other   Hyperlipidemia    Under good control on current regimen. Continue current regimen. Continue to monitor. Call with any concerns. Refills given. Labs drawn today.       Relevant Medications   lisinopril-hydrochlorothiazide (ZESTORETIC) 20-25 MG tablet   atorvastatin  (LIPITOR) 10 MG tablet   Other Relevant Orders   Comprehensive metabolic panel   Lipid Panel w/o Chol/HDL Ratio   Gout    Rechecking labs today. Await results. Treat as needed.       Relevant Orders   Comprehensive metabolic panel   Vitamin D deficiency    Rechecking labs today. Await results. Treat as needed.       Relevant Orders   Comprehensive metabolic panel   VITAMIN D 25 Hydroxy (Vit-D Deficiency, Fractures)   B12 deficiency    Under good control on current regimen. Continue current regimen. Continue to monitor. Call with any concerns. Refills given. Labs drawn today.       Relevant Orders   Comprehensive metabolic panel   T01   Anxiety    Under good control on current regimen. Continue current regimen. Continue to monitor. Call with any concerns. Refills given. Labs drawn today.       Relevant Medications   escitalopram (LEXAPRO) 10 MG tablet   busPIRone (BUSPAR) 5 MG tablet    Other Visit Diagnoses    Primary osteoarthritis, unspecified site       Would like to see ortho. Referral generated today.   Relevant Orders   Ambulatory referral to Orthopedic Surgery   Lateral epicondylitis, left elbow       Injected today as below. Start stretches. Call with any concerns.       Procedure: Left Lateral Epicondylitis Steroid Injection        Diagnosis:   ICD-10-CM   1. Benign essential hypertension  I10 Comprehensive metabolic panel  2. Vitamin D deficiency  E55.9 Comprehensive metabolic panel    VITAMIN D 25 Hydroxy (Vit-D Deficiency, Fractures)  3. Mixed hyperlipidemia  E78.2 Comprehensive metabolic panel    Lipid Panel w/o Chol/HDL Ratio  4. B12 deficiency  E53.8 Comprehensive metabolic panel  B12  5. Gout of foot, unspecified cause, unspecified chronicity, unspecified laterality  M10.9 Comprehensive metabolic panel  6. Gastroesophageal reflux disease, unspecified whether esophagitis present  K21.9 CBC with Differential/Platelet    Comprehensive metabolic  panel  7. Primary osteoarthritis, unspecified site  M19.91 Ambulatory referral to Orthopedic Surgery   Would like to see ortho. Referral generated today.  8. Lateral epicondylitis, left elbow  M77.12    Injected today as below. Start stretches. Call with any concerns.   9. Osteoporosis with current pathological fracture, unspecified osteoporosis type, sequela  M80.00XS   10. Anxiety  F41.9     Physician: MJ Consent:  Risks, benefits, and alternative treatments discussed and all questions were answered.  Patient elected to proceed and verbal consent obtained.  Description: Area prepped and draped using  semi-sterile technique.  After identifying area of maximal tenderness, A mixture of 1cc 1% lidocaine and 0.5cc Kenalog 40 was injected.  A bandage was then placed over the injection site. Complications: none Post Procedure Instructions: Wound care instructions discussed and patient was instructed to keep area clean and dry.  Signs and symptoms of infection discussed, patient agrees to contact the office ASAP should they occur.  Follow Up: Return in about 6 months (around 02/05/2021) for physical.   Follow up plan: Return in about 6 months (around 02/05/2021) for physical.

## 2020-08-09 LAB — LIPID PANEL W/O CHOL/HDL RATIO
Cholesterol, Total: 209 mg/dL — ABNORMAL HIGH (ref 100–199)
HDL: 53 mg/dL (ref >39)
LDL Chol Calc (NIH): 134 mg/dL — ABNORMAL HIGH (ref 0–99)
Triglycerides: 126 mg/dL (ref 0–149)
VLDL Cholesterol Cal: 22 mg/dL (ref 5–40)

## 2020-08-09 LAB — COMPREHENSIVE METABOLIC PANEL
ALT: 11 IU/L (ref 0–32)
AST: 19 IU/L (ref 0–40)
Albumin/Globulin Ratio: 1.2 (ref 1.2–2.2)
Albumin: 4.1 g/dL (ref 3.8–4.8)
Alkaline Phosphatase: 77 IU/L (ref 44–121)
BUN/Creatinine Ratio: 15 (ref 9–23)
BUN: 13 mg/dL (ref 6–24)
Bilirubin Total: 0.2 mg/dL (ref 0.0–1.2)
CO2: 30 mmol/L — ABNORMAL HIGH (ref 20–29)
Calcium: 9.1 mg/dL (ref 8.7–10.2)
Chloride: 102 mmol/L (ref 96–106)
Creatinine, Ser: 0.87 mg/dL (ref 0.57–1.00)
GFR calc Af Amer: 91 mL/min/{1.73_m2} (ref >59)
GFR calc non Af Amer: 79 mL/min/{1.73_m2} (ref >59)
Globulin, Total: 3.3 g/dL (ref 1.5–4.5)
Glucose: 66 mg/dL (ref 65–99)
Potassium: 3.8 mmol/L (ref 3.5–5.2)
Sodium: 143 mmol/L (ref 134–144)
Total Protein: 7.4 g/dL (ref 6.0–8.5)

## 2020-08-09 LAB — CBC WITH DIFFERENTIAL/PLATELET
Basophils Absolute: 0 10*3/uL (ref 0.0–0.2)
Basos: 0 %
EOS (ABSOLUTE): 0.2 10*3/uL (ref 0.0–0.4)
Eos: 3 %
Hematocrit: 32.7 % — ABNORMAL LOW (ref 34.0–46.6)
Hemoglobin: 10.5 g/dL — ABNORMAL LOW (ref 11.1–15.9)
Immature Grans (Abs): 0 10*3/uL (ref 0.0–0.1)
Immature Granulocytes: 0 %
Lymphocytes Absolute: 2.8 10*3/uL (ref 0.7–3.1)
Lymphs: 40 %
MCH: 27 pg (ref 26.6–33.0)
MCHC: 32.1 g/dL (ref 31.5–35.7)
MCV: 84 fL (ref 79–97)
Monocytes Absolute: 0.6 10*3/uL (ref 0.1–0.9)
Monocytes: 9 %
Neutrophils Absolute: 3.2 10*3/uL (ref 1.4–7.0)
Neutrophils: 48 %
Platelets: 285 10*3/uL (ref 150–450)
RBC: 3.89 x10E6/uL (ref 3.77–5.28)
RDW: 13.2 % (ref 11.7–15.4)
WBC: 6.8 10*3/uL (ref 3.4–10.8)

## 2020-08-09 LAB — VITAMIN D 25 HYDROXY (VIT D DEFICIENCY, FRACTURES): Vit D, 25-Hydroxy: 24.9 ng/mL — ABNORMAL LOW (ref 30.0–100.0)

## 2020-08-09 LAB — VITAMIN B12: Vitamin B-12: 558 pg/mL (ref 232–1245)

## 2020-08-12 ENCOUNTER — Other Ambulatory Visit: Payer: Self-pay | Admitting: Family Medicine

## 2020-08-12 NOTE — Telephone Encounter (Signed)
Requested medication (s) are due for refill today: yes  Requested medication (s) are on the active medication list: yes  Last refill:  05/03/20 4 capsules 2 RF  Future visit scheduled: yes  Notes to clinic:  med not delegated to NT to RF   Requested Prescriptions  Pending Prescriptions Disp Refills   Vitamin D, Ergocalciferol, (DRISDOL) 1.25 MG (50000 UNIT) CAPS capsule [Pharmacy Med Name: Vitamin D (Ergocalciferol) 50000 UNIT Oral Capsule] 4 capsule 0    Sig: Take 1 capsule by mouth once a week      Endocrinology:  Vitamins - Vitamin D Supplementation Failed - 08/12/2020 11:28 AM      Failed - 50,000 IU strengths are not delegated      Failed - Phosphate in normal range and within 360 days    Phosphorus  Date Value Ref Range Status  10/10/2014 3.3 2.5 - 4.9 mg/dL Final          Failed - Vitamin D in normal range and within 360 days    Vit D, 1,25-Dihydroxy  Date Value Ref Range Status  04/06/2015 57.7 19.9 - 79.3 pg/mL Final   Vit D, 25-Hydroxy  Date Value Ref Range Status  08/08/2020 24.9 (L) 30.0 - 100.0 ng/mL Final    Comment:    Vitamin D deficiency has been defined by the Institute of Medicine and an Endocrine Society practice guideline as a level of serum 25-OH vitamin D less than 20 ng/mL (1,2). The Endocrine Society went on to further define vitamin D insufficiency as a level between 21 and 29 ng/mL (2). 1. IOM (Institute of Medicine). 2010. Dietary reference    intakes for calcium and D. [place]: The    [employer]. 2. Holick MF, [place], Bischoff-Ferrari HA, et al.    Evaluation, treatment, and prevention of vitamin D    deficiency: an Endocrine Society clinical practice    guideline. JCEM. 2011 Jul; 96(7):1911-30.           Passed - Ca in normal range and within 360 days    Calcium  Date Value Ref Range Status  08/08/2020 9.1 8.7 - 10.2 mg/dL Final   Calcium, Total  Date Value Ref Range Status  10/10/2014 9.2 8.5 - 10.1 mg/dL  Final   Calcium, Ion  Date Value Ref Range Status  07/12/2008 1.20  Final          Passed - Valid encounter within last 12 months    Recent Outpatient Visits           4 days ago Benign essential hypertension   [place], [place], DO   3 months ago [place], [place], DO   4 months ago [place], Megan P, DO   6 months ago Routine general medical examination at a health care facility   [facility], [place], DO   7 months ago [place], [place], DO       Future Appointments             In 5 months [name], Barb Merino, DO [facility], [facility]

## 2020-08-14 NOTE — Telephone Encounter (Signed)
She does not need the large dose- she can just take OTC vitamin D

## 2020-08-14 NOTE — Telephone Encounter (Signed)
Pt had recent labs done, see [facility] note down below.

## 2020-09-12 ENCOUNTER — Other Ambulatory Visit: Payer: Self-pay | Admitting: Family Medicine

## 2020-09-12 NOTE — Telephone Encounter (Signed)
Routing to provider  

## 2020-09-12 NOTE — Telephone Encounter (Signed)
Requested medication (s) are due for refill today:   Provider to determine  Requested medication (s) are on the active medication list:   Yes  Future visit scheduled:   Yes   Last ordered: 05/03/2020 #4, 2 refills  Non delegated refill   Requested Prescriptions  Pending Prescriptions Disp Refills   Vitamin D, Ergocalciferol, (DRISDOL) 1.25 MG (50000 UNIT) CAPS capsule [Pharmacy Med Name: Vitamin D (Ergocalciferol) 50000 UNIT Oral Capsule] 4 capsule 0    Sig: Take 1 capsule by mouth once a week      Endocrinology:  Vitamins - Vitamin D Supplementation Failed - 09/12/2020 12:54 PM      Failed - 50,000 IU strengths are not delegated      Failed - Phosphate in normal range and within 360 days    Phosphorus  Date Value Ref Range Status  10/10/2014 3.3 2.5 - 4.9 mg/dL Final          Failed - Vitamin D in normal range and within 360 days    Vit D, 1,25-Dihydroxy  Date Value Ref Range Status  04/06/2015 57.7 19.9 - 79.3 pg/mL Final   Vit D, 25-Hydroxy  Date Value Ref Range Status  08/08/2020 24.9 (L) 30.0 - 100.0 ng/mL Final    Comment:    Vitamin D deficiency has been defined by the Institute of Medicine and an Endocrine Society practice guideline as a level of serum 25-OH vitamin D less than 20 ng/mL (1,2). The Endocrine Society went on to further define vitamin D insufficiency as a level between 21 and 29 ng/mL (2). 1. IOM (Institute of Medicine). 2010. Dietary reference    intakes for calcium and D. [place]: The    [employer]. 2. Holick MF, [place], Bischoff-Ferrari HA, et al.    Evaluation, treatment, and prevention of vitamin D    deficiency: an Endocrine Society clinical practice    guideline. JCEM. 2011 Jul; 96(7):1911-30.           Passed - Ca in normal range and within 360 days    Calcium  Date Value Ref Range Status  08/08/2020 9.1 8.7 - 10.2 mg/dL Final   Calcium, Total  Date Value Ref Range Status  10/10/2014 9.2 8.5 - 10.1 mg/dL  Final   Calcium, Ion  Date Value Ref Range Status  07/12/2008 1.20  Final          Passed - Valid encounter within last 12 months    Recent Outpatient Visits           1 month ago Benign essential hypertension   [place], [place], DO   4 months ago [place], [place], DO   5 months ago [place], Megan P, DO   7 months ago Routine general medical examination at a health care facility   [place], [place], DO   8 months ago [place], [place], DO       Future Appointments             In 4 months [name], Barb Merino, DO [facility], [facility]

## 2020-10-25 ENCOUNTER — Ambulatory Visit: Payer: BLUE CROSS/BLUE SHIELD | Admitting: Family Medicine

## 2020-11-21 ENCOUNTER — Telehealth: Payer: BLUE CROSS/BLUE SHIELD | Admitting: Family Medicine

## 2020-11-21 ENCOUNTER — Telehealth (INDEPENDENT_AMBULATORY_CARE_PROVIDER_SITE_OTHER): Payer: Self-pay | Admitting: Family Medicine

## 2020-11-21 ENCOUNTER — Encounter: Payer: Self-pay | Admitting: Family Medicine

## 2020-11-21 DIAGNOSIS — R0981 Nasal congestion: Secondary | ICD-10-CM

## 2020-11-21 MED ORDER — PREDNISONE 50 MG PO TABS: 50.0000 mg | ORAL_TABLET | Freq: Every day | ORAL | 0 refills | Status: DC

## 2020-11-21 MED ORDER — FLUCONAZOLE 150 MG PO TABS: 150.0000 mg | ORAL_TABLET | Freq: Every day | ORAL | 0 refills | Status: DC

## 2020-11-21 MED ORDER — DOXYCYCLINE HYCLATE 100 MG PO TABS: 100.0000 mg | ORAL_TABLET | Freq: Two times a day (BID) | ORAL | 0 refills | Status: DC

## 2020-11-21 NOTE — Progress Notes (Signed)
There were no vitals taken for this visit.   Subjective:    Patient ID: Tina Harvey, female    DOB: 07-Apr-1971, 50 y.o.   MRN: [id number]  HPI: Tina Harvey is a 50 y.o. female  Chief Complaint  Patient presents with  . Nasal Congestion    Patient states when she blows her nose she has yellow mucus   . Sinusitis    Patient has facial pressure for about a week, has right ear pain.    UPPER RESPIRATORY TRACT INFECTION Duration: about a week Worst symptom: congestion and facial pressure Fever: no Cough: no Shortness of breath: no Wheezing: no Chest pain: no Chest tightness: no Chest congestion: no Nasal congestion: yes Runny nose: no Post nasal drip: yes Sneezing: yes Sore throat: no Swollen glands: no Sinus pressure: yes Headache: yes Face pain: yes Toothache: no Ear pain: yes "right Ear pressure: yes "right Eyes red/itching:no Eye drainage/crusting: no  Vomiting: no Rash: no Fatigue: yes Sick contacts: no Strep contacts: no  Context: worse Recurrent sinusitis: no Relief with OTC cold/cough medications: no  Treatments attempted: benadryl    Relevant past medical, surgical, family and social history reviewed and updated as indicated. Interim medical history since our last visit reviewed. Allergies and medications reviewed and updated.  Review of Systems  Constitutional: Negative.   HENT: Positive for congestion, ear pain, postnasal drip, sinus pressure and sinus pain. Negative for dental problem, drooling, ear discharge, facial swelling, hearing loss, mouth sores, nosebleeds, rhinorrhea, sneezing, sore throat, tinnitus, trouble swallowing and voice change.   Respiratory: Negative.   Cardiovascular: Negative.   Gastrointestinal: Negative.   Neurological: Negative.   Psychiatric/Behavioral: Negative.     Per HPI unless specifically indicated above     Objective:    There were no vitals taken for this visit.  Wt Readings from Last 3 Encounters:   08/08/20 172 lb 3.2 oz (78.1 kg)  04/19/20 177 lb 12.8 oz (80.6 kg)  03/22/20 180 lb 9.6 oz (81.9 kg)    Physical Exam Vitals and nursing note reviewed.  Pulmonary:     Effort: Pulmonary effort is normal. No respiratory distress.     Comments: Speaking in full sentences Neurological:     Mental Status: She is alert.  Psychiatric:        Mood and Affect: Mood normal.        Behavior: Behavior normal.        Thought Content: Thought content normal.        Judgment: Judgment normal.     Results for orders placed or performed in visit on 08/08/20  CBC with Differential/Platelet  Result Value Ref Range   WBC 6.8 3.4 - 10.8 x10E3/uL   RBC 3.89 3.77 - 5.28 x10E6/uL   Hemoglobin 10.5 (L) 11.1 - 15.9 g/dL   Hematocrit 32.7 (L) 34.0 - 46.6 %   MCV 84 79 - 97 fL   MCH 27.0 26.6 - 33.0 pg   MCHC 32.1 31.5 - 35.7 g/dL   RDW 13.2 11.7 - 15.4 %   Platelets 285 150 - 450 x10E3/uL   Neutrophils 48 Not Estab. %   Lymphs 40 Not Estab. %   Monocytes 9 Not Estab. %   Eos 3 Not Estab. %   Basos 0 Not Estab. %   Neutrophils Absolute 3.2 1.4 - 7.0 x10E3/uL   Lymphocytes Absolute 2.8 0.7 - 3.1 x10E3/uL   Monocytes Absolute 0.6 0.1 - 0.9 x10E3/uL   EOS (ABSOLUTE) 0.2 0.0 -  0.4 x10E3/uL   Basophils Absolute 0.0 0.0 - 0.2 x10E3/uL   Immature Granulocytes 0 Not Estab. %   Immature Grans (Abs) 0.0 0.0 - 0.1 x10E3/uL  Comprehensive metabolic panel  Result Value Ref Range   Glucose 66 65 - 99 mg/dL   BUN 13 6 - 24 mg/dL   Creatinine, Ser 0.87 0.57 - 1.00 mg/dL   GFR calc non Af Amer 79 >59 mL/min/1.73   GFR calc Af Amer 91 >59 mL/min/1.73   BUN/Creatinine Ratio 15 9 - 23   Sodium 143 134 - 144 mmol/L   Potassium 3.8 3.5 - 5.2 mmol/L   Chloride 102 96 - 106 mmol/L   CO2 30 (H) 20 - 29 mmol/L   Calcium 9.1 8.7 - 10.2 mg/dL   Total Protein 7.4 6.0 - 8.5 g/dL   Albumin 4.1 3.8 - 4.8 g/dL   Globulin, Total 3.3 1.5 - 4.5 g/dL   Albumin/Globulin Ratio 1.2 1.2 - 2.2   Bilirubin Total 0.2 0.0 -  1.2 mg/dL   Alkaline Phosphatase 77 44 - 121 IU/L   AST 19 0 - 40 IU/L   ALT 11 0 - 32 IU/L  Lipid Panel w/o Chol/HDL Ratio  Result Value Ref Range   Cholesterol, Total 209 (H) 100 - 199 mg/dL   Triglycerides 126 0 - 149 mg/dL   HDL 53 >39 mg/dL   VLDL Cholesterol Cal 22 5 - 40 mg/dL   LDL Chol Calc (NIH) 134 (H) 0 - 99 mg/dL  VITAMIN D 25 Hydroxy (Vit-D Deficiency, Fractures)  Result Value Ref Range   Vit D, 25-Hydroxy 24.9 (L) 30.0 - 100.0 ng/mL  B12  Result Value Ref Range   Vitamin B-12 558 232 - 1,245 pg/mL      Assessment & Plan:   Problem List Items Addressed This Visit   None   Visit Diagnoses    Nasal congestion    -  Primary   Will treat with prednisone. If not getting better in the next 3 days, will start doxycycline. Call with any concerns. Continue to monitor.        Follow up plan: Return if symptoms worsen or fail to improve.    . This visit was completed via telephone due to the restrictions of the COVID-19 pandemic. All issues as above were discussed and addressed but no physical exam was performed. If it was felt that the patient should be evaluated in the office, they were directed there. The patient verbally consented to this visit. Patient was unable to complete an audio/visual visit due to Lack of equipment. Due to the catastrophic nature of the COVID-19 pandemic, this visit was done through audio contact only. . Location of the patient: home . Location of the provider: work . Those involved with this call:  . Provider: Park Liter, DO . CMA: Louanna Raw, [place] . Front Desk/Registration: Jill Side  . Time spent on call: 21 minutes on the phone discussing health concerns. 30 minutes total spent in review of patient's record and preparation of their chart.

## 2020-12-08 ENCOUNTER — Other Ambulatory Visit: Payer: Self-pay

## 2020-12-08 ENCOUNTER — Ambulatory Visit (INDEPENDENT_AMBULATORY_CARE_PROVIDER_SITE_OTHER): Payer: 59 | Admitting: Family Medicine

## 2020-12-08 ENCOUNTER — Encounter: Payer: Self-pay | Admitting: Family Medicine

## 2020-12-08 VITALS — BP 117/77 | HR 77 | Wt 177.6 lb

## 2020-12-08 DIAGNOSIS — R41840 Attention and concentration deficit: Secondary | ICD-10-CM | POA: Diagnosis not present

## 2020-12-08 DIAGNOSIS — F4489 Other dissociative and conversion disorders: Secondary | ICD-10-CM

## 2020-12-08 DIAGNOSIS — Z1231 Encounter for screening mammogram for malignant neoplasm of breast: Secondary | ICD-10-CM

## 2020-12-08 DIAGNOSIS — F419 Anxiety disorder, unspecified: Secondary | ICD-10-CM

## 2020-12-08 DIAGNOSIS — R413 Other amnesia: Secondary | ICD-10-CM

## 2020-12-08 MED ORDER — VITAMIN D (ERGOCALCIFEROL) 1.25 MG (50000 UNIT) PO CAPS: 50000.0000 [IU] | ORAL_CAPSULE | ORAL | 1 refills | Status: DC

## 2020-12-08 MED ORDER — EPINEPHRINE 0.3 MG/0.3ML IJ SOAJ: 0.3000 mg | Freq: Once | INTRAMUSCULAR | 12 refills | Status: DC

## 2020-12-08 MED ORDER — PANTOPRAZOLE SODIUM 40 MG PO TBEC: 40.0000 mg | DELAYED_RELEASE_TABLET | Freq: Every day | ORAL | 0 refills | Status: DC

## 2020-12-08 NOTE — Progress Notes (Signed)
BP 117/77   Pulse 77   Wt 177 lb 9.6 oz (80.6 kg)   SpO2 98%   BMI 28.21 kg/m    Subjective:    Patient ID: Real Cons, female    DOB: [birth year], 50 y.o.   MRN: [id number]  HPI: ALISHEA BEAUDIN is a 50 y.o. female  Chief Complaint  Patient presents with  . ADHD    Patient therapist tested her for ADHD, and scored highly likely.   . Medication Refill    Patient needs epi pen refill    ??? ADHD- did a screening test with her counselor and she was concerned for ADD- they recommended that she get tested.  ADHD status: unsure Satisfied with current therapy: no Medication compliance:  Not on anything Controlled substance contract: no Previous psychiatry evaluation: no Previous medications: none    Work/school performance:  fair Difficulty sustaining attention/completing tasks: yes Distracted by extraneous stimuli: yes Does not listen when spoken to: no  Fidgets with hands or feet: no Unable to stay in seat: no Blurts out/interrupts others: yes ADHD Medication Side Effects: not on anything  Relevant past medical, surgical, family and social history reviewed and updated as indicated. Interim medical history since our last visit reviewed. Allergies and medications reviewed and updated.  Review of Systems  Constitutional: Negative.   Respiratory: Negative.   Cardiovascular: Negative.   Gastrointestinal: Negative.   Skin: Negative.   Psychiatric/Behavioral: Positive for decreased concentration. Negative for agitation, behavioral problems, confusion, dysphoric mood, hallucinations, self-injury, sleep disturbance and suicidal ideas. The patient is nervous/anxious. The patient is not hyperactive.     Per HPI unless specifically indicated above     Objective:    BP 117/77   Pulse 77   Wt 177 lb 9.6 oz (80.6 kg)   SpO2 98%   BMI 28.21 kg/m   Wt Readings from Last 3 Encounters:  12/08/20 177 lb 9.6 oz (80.6 kg)  08/08/20 172 lb 3.2 oz (78.1 kg)  04/19/20 177 lb 12.8 oz  (80.6 kg)    Physical Exam Vitals and nursing note reviewed.  Constitutional:      General: She is not in acute distress.    Appearance: Normal appearance. She is not ill-appearing, toxic-appearing or diaphoretic.  HENT:     Head: Normocephalic and atraumatic.     Right Ear: External ear normal.     Left Ear: External ear normal.     Nose: Nose normal.     Mouth/Throat:     Mouth: Mucous membranes are moist.     Pharynx: Oropharynx is clear.  Eyes:     General: No scleral icterus.       Right eye: No discharge.        Left eye: No discharge.     Extraocular Movements: Extraocular movements intact.     Conjunctiva/sclera: Conjunctivae normal.     Pupils: Pupils are equal, round, and reactive to light.  Cardiovascular:     Rate and Rhythm: Normal rate and regular rhythm.     Pulses: Normal pulses.     Heart sounds: Normal heart sounds. No murmur heard. No friction rub. No gallop.   Pulmonary:     Effort: Pulmonary effort is normal. No respiratory distress.     Breath sounds: Normal breath sounds. No stridor. No wheezing, rhonchi or rales.  Chest:     Chest wall: No tenderness.  Musculoskeletal:        General: Normal range of motion.  Cervical back: Normal range of motion and neck supple.  Skin:    General: Skin is warm and dry.     Capillary Refill: Capillary refill takes less than 2 seconds.     Coloration: Skin is not jaundiced or pale.     Findings: No bruising, erythema, lesion or rash.  Neurological:     General: No focal deficit present.     Mental Status: She is alert and oriented to person, place, and time. Mental status is at baseline.  Psychiatric:        Mood and Affect: Mood normal.        Behavior: Behavior normal.        Thought Content: Thought content normal.        Judgment: Judgment normal.     Results for orders placed or performed in visit on 08/08/20  CBC with Differential/Platelet  Result Value Ref Range   WBC 6.8 3.4 - 10.8 x10E3/uL    RBC 3.89 3.77 - 5.28 x10E6/uL   Hemoglobin 10.5 (L) 11.1 - 15.9 g/dL   Hematocrit 32.7 (L) 34.0 - 46.6 %   MCV 84 79 - 97 fL   MCH 27.0 26.6 - 33.0 pg   MCHC 32.1 31.5 - 35.7 g/dL   RDW 13.2 11.7 - 15.4 %   Platelets 285 150 - 450 x10E3/uL   Neutrophils 48 Not Estab. %   Lymphs 40 Not Estab. %   Monocytes 9 Not Estab. %   Eos 3 Not Estab. %   Basos 0 Not Estab. %   Neutrophils Absolute 3.2 1.4 - 7.0 x10E3/uL   Lymphocytes Absolute 2.8 0.7 - 3.1 x10E3/uL   Monocytes Absolute 0.6 0.1 - 0.9 x10E3/uL   EOS (ABSOLUTE) 0.2 0.0 - 0.4 x10E3/uL   Basophils Absolute 0.0 0.0 - 0.2 x10E3/uL   Immature Granulocytes 0 Not Estab. %   Immature Grans (Abs) 0.0 0.0 - 0.1 x10E3/uL  Comprehensive metabolic panel  Result Value Ref Range   Glucose 66 65 - 99 mg/dL   BUN 13 6 - 24 mg/dL   Creatinine, Ser 0.87 0.57 - 1.00 mg/dL   GFR calc non Af Amer 79 >59 mL/min/1.73   GFR calc Af Amer 91 >59 mL/min/1.73   BUN/Creatinine Ratio 15 9 - 23   Sodium 143 134 - 144 mmol/L   Potassium 3.8 3.5 - 5.2 mmol/L   Chloride 102 96 - 106 mmol/L   CO2 30 (H) 20 - 29 mmol/L   Calcium 9.1 8.7 - 10.2 mg/dL   Total Protein 7.4 6.0 - 8.5 g/dL   Albumin 4.1 3.8 - 4.8 g/dL   Globulin, Total 3.3 1.5 - 4.5 g/dL   Albumin/Globulin Ratio 1.2 1.2 - 2.2   Bilirubin Total 0.2 0.0 - 1.2 mg/dL   Alkaline Phosphatase 77 44 - 121 IU/L   AST 19 0 - 40 IU/L   ALT 11 0 - 32 IU/L  Lipid Panel w/o Chol/HDL Ratio  Result Value Ref Range   Cholesterol, Total 209 (H) 100 - 199 mg/dL   Triglycerides 126 0 - 149 mg/dL   HDL 53 >39 mg/dL   VLDL Cholesterol Cal 22 5 - 40 mg/dL   LDL Chol Calc (NIH) 134 (H) 0 - 99 mg/dL  VITAMIN D 25 Hydroxy (Vit-D Deficiency, Fractures)  Result Value Ref Range   Vit D, 25-Hydroxy 24.9 (L) 30.0 - 100.0 ng/mL  B12  Result Value Ref Range   Vitamin B-12 558 232 - 1,245 pg/mL  Assessment & Plan:   Problem List Items Addressed This Visit      Nervous and Auditory   Confusion state    Relevant Orders   Ambulatory referral to Psychiatry     Other   Anxiety   Relevant Orders   Ambulatory referral to Psychiatry   Memory loss   Relevant Orders   Ambulatory referral to Psychiatry   Concentration deficit - Primary    Discussed that I do not diagnose adult ADD. Offered referral to neuropsych vs psychiatry. She would like to speak to psychiatry. Given history of confusion/TBI/PTSD/Anxiety would appreciate their input. Discussed with patient that if she gets a formal diagnosis and is stable on her medicine, I'm happy to write it for her. Await consult with psychiatry. Call with any concerns.       Relevant Orders   Ambulatory referral to Psychiatry    Other Visit Diagnoses    Encounter for screening mammogram for malignant neoplasm of breast       Mammogram ordered today.    Relevant Orders   MM 3D SCREEN BREAST BILATERAL       Follow up plan: Return As scheduled.

## 2020-12-08 NOTE — Assessment & Plan Note (Signed)
Discussed that I do not diagnose adult ADD. Offered referral to neuropsych vs psychiatry. She would like to speak to psychiatry. Given history of confusion/TBI/PTSD/Anxiety would appreciate their input. Discussed with patient that if she gets a formal diagnosis and is stable on her medicine, I'm happy to write it for her. Await consult with psychiatry. Call with any concerns.

## 2021-01-10 ENCOUNTER — Ambulatory Visit
Admission: RE | Admit: 2021-01-10 | Discharge: 2021-01-10 | Disposition: A | Discharge status: Home / Self Care | Payer: 59 | Source: Ambulatory Visit | Attending: Family Medicine | Admitting: Family Medicine

## 2021-01-10 ENCOUNTER — Other Ambulatory Visit: Payer: Self-pay

## 2021-01-10 DIAGNOSIS — Z1231 Encounter for screening mammogram for malignant neoplasm of breast: Secondary | ICD-10-CM | POA: Diagnosis present

## 2021-02-07 ENCOUNTER — Other Ambulatory Visit: Payer: Self-pay

## 2021-02-07 ENCOUNTER — Ambulatory Visit (INDEPENDENT_AMBULATORY_CARE_PROVIDER_SITE_OTHER): Payer: 59 | Admitting: Family Medicine

## 2021-02-07 ENCOUNTER — Encounter: Payer: Self-pay | Admitting: Family Medicine

## 2021-02-07 VITALS — BP 131/83 | HR 62 | Temp 97.9°F | Ht 66.54 in | Wt 185.2 lb

## 2021-02-07 DIAGNOSIS — E538 Deficiency of other specified B group vitamins: Secondary | ICD-10-CM

## 2021-02-07 DIAGNOSIS — Z0000 Encounter for general adult medical examination without abnormal findings: Secondary | ICD-10-CM

## 2021-02-07 DIAGNOSIS — M79605 Pain in left leg: Secondary | ICD-10-CM

## 2021-02-07 DIAGNOSIS — E559 Vitamin D deficiency, unspecified: Secondary | ICD-10-CM

## 2021-02-07 DIAGNOSIS — I10 Essential (primary) hypertension: Secondary | ICD-10-CM

## 2021-02-07 DIAGNOSIS — M79604 Pain in right leg: Secondary | ICD-10-CM

## 2021-02-07 DIAGNOSIS — E782 Mixed hyperlipidemia: Secondary | ICD-10-CM

## 2021-02-07 DIAGNOSIS — Z8639 Personal history of other endocrine, nutritional and metabolic disease: Secondary | ICD-10-CM

## 2021-02-07 LAB — BAYER DCA HB A1C WAIVED: HB A1C (BAYER DCA - WAIVED): 5.3 % (ref <7.0)

## 2021-02-07 LAB — URINALYSIS, ROUTINE W REFLEX MICROSCOPIC
Bilirubin, UA: NEGATIVE
Glucose, UA: NEGATIVE
Ketones, UA: NEGATIVE
Nitrite, UA: NEGATIVE
Protein,UA: NEGATIVE
Specific Gravity, UA: 1.02 (ref 1.005–1.030)
Urobilinogen, Ur: 0.2 mg/dL (ref 0.2–1.0)
pH, UA: 7 (ref 5.0–7.5)

## 2021-02-07 LAB — MICROSCOPIC EXAMINATION: RBC, Urine: NONE SEEN /hpf (ref 0–2)

## 2021-02-07 MED ORDER — PANTOPRAZOLE SODIUM 40 MG PO TBEC: 40.0000 mg | DELAYED_RELEASE_TABLET | Freq: Every day | ORAL | 1 refills | Status: DC

## 2021-02-07 MED ORDER — LISINOPRIL-HYDROCHLOROTHIAZIDE 20-25 MG PO TABS: 1.0000 | ORAL_TABLET | Freq: Every day | ORAL | 1 refills | Status: DC

## 2021-02-07 MED ORDER — CYANOCOBALAMIN 1000 MCG/ML IJ SOLN: 1000.0000 ug | INTRAMUSCULAR | 12 refills | Status: DC

## 2021-02-07 MED ORDER — ESCITALOPRAM OXALATE 10 MG PO TABS: 10.0000 mg | ORAL_TABLET | Freq: Every day | ORAL | 1 refills | Status: DC

## 2021-02-07 MED ORDER — BUSPIRONE HCL 5 MG PO TABS: 5.0000 mg | ORAL_TABLET | Freq: Three times a day (TID) | ORAL | 6 refills | Status: DC

## 2021-02-07 MED ORDER — ATORVASTATIN CALCIUM 10 MG PO TABS: 10.0000 mg | ORAL_TABLET | Freq: Every day | ORAL | 1 refills | Status: DC
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
## 2021-02-07 NOTE — Patient Instructions (Signed)
Health Maintenance, Female Adopting a healthy lifestyle and getting preventive care are important in promoting health and wellness. Ask your health care provider about:  The right schedule for you to have regular tests and exams.  Things you can do on your own to prevent diseases and keep yourself healthy. What should I know about diet, weight, and exercise? Eat a healthy diet  Eat a diet that includes plenty of vegetables, fruits, low-fat dairy products, and lean protein.  Do not eat a lot of foods that are high in solid fats, added sugars, or sodium.   Maintain a healthy weight Body mass index (BMI) is used to identify weight problems. It estimates body fat based on height and weight. Your health care provider can help determine your BMI and help you achieve or maintain a healthy weight. Get regular exercise Get regular exercise. This is one of the most important things you can do for your health. Most adults should:  Exercise for at least 150 minutes each week. The exercise should increase your heart rate and make you sweat (moderate-intensity exercise).  Do strengthening exercises at least twice a week. This is in addition to the moderate-intensity exercise.  Spend less time sitting. Even light physical activity can be beneficial. Watch cholesterol and blood lipids Have your blood tested for lipids and cholesterol at 49 years of age, then have this test every 5 years. Have your cholesterol levels checked more often if:  Your lipid or cholesterol levels are high.  You are older than 49 years of age.  You are at high risk for heart disease. What should I know about cancer screening? Depending on your health history and family history, you may need to have cancer screening at various ages. This may include screening for:  Breast cancer.  Cervical cancer.  Colorectal cancer.  Skin cancer.  Lung cancer. What should I know about heart disease, diabetes, and high blood  pressure? Blood pressure and heart disease  High blood pressure causes heart disease and increases the risk of stroke. This is more likely to develop in people who have high blood pressure readings, are of African descent, or are overweight.  Have your blood pressure checked: ? Every 3-5 years if you are 18-39 years of age. ? Every year if you are 40 years old or older. Diabetes Have regular diabetes screenings. This checks your fasting blood sugar level. Have the screening done:  Once every three years after age 40 if you are at a normal weight and have a low risk for diabetes.  More often and at a younger age if you are overweight or have a high risk for diabetes. What should I know about preventing infection? Hepatitis B If you have a higher risk for hepatitis B, you should be screened for this virus. Talk with your health care provider to find out if you are at risk for hepatitis B infection. Hepatitis C Testing is recommended for:  Everyone born from 1945 through 1965.  Anyone with known risk factors for hepatitis C. Sexually transmitted infections (STIs)  Get screened for STIs, including gonorrhea and chlamydia, if: ? You are sexually active and are younger than 49 years of age. ? You are older than 49 years of age and your health care provider tells you that you are at risk for this type of infection. ? Your sexual activity has changed since you were last screened, and you are at increased risk for chlamydia or gonorrhea. Ask your health care provider   if you are at risk.  Ask your health care provider about whether you are at high risk for HIV. Your health care provider may recommend a prescription medicine to help prevent HIV infection. If you choose to take medicine to prevent HIV, you should first get tested for HIV. You should then be tested every 3 months for as long as you are taking the medicine. Pregnancy  If you are about to stop having your period (premenopausal) and  you may become pregnant, seek counseling before you get pregnant.  Take 400 to 800 micrograms (mcg) of folic acid every day if you become pregnant.  Ask for birth control (contraception) if you want to prevent pregnancy. Osteoporosis and menopause Osteoporosis is a disease in which the bones lose minerals and strength with aging. This can result in bone fractures. If you are 65 years old or older, or if you are at risk for osteoporosis and fractures, ask your health care provider if you should:  Be screened for bone loss.  Take a calcium or vitamin D supplement to lower your risk of fractures.  Be given hormone replacement therapy (HRT) to treat symptoms of menopause. Follow these instructions at home: Lifestyle  Do not use any products that contain nicotine or tobacco, such as cigarettes, e-cigarettes, and chewing tobacco. If you need help quitting, ask your health care provider.  Do not use street drugs.  Do not share needles.  Ask your health care provider for help if you need support or information about quitting drugs. Alcohol use  Do not drink alcohol if: ? Your health care provider tells you not to drink. ? You are pregnant, may be pregnant, or are planning to become pregnant.  If you drink alcohol: ? Limit how much you use to 0-1 drink a day. ? Limit intake if you are breastfeeding.  Be aware of how much alcohol is in your drink. In the [place] one drink equals one 12 oz bottle of beer (355 mL), one 5 oz glass of wine (148 mL), or one 1 oz glass of hard liquor (44 mL). General instructions  Schedule regular health, dental, and eye exams.  Stay current with your vaccines.  Tell your health care provider if: ? You often feel depressed. ? You have ever been abused or do not feel safe at home. Summary  Adopting a healthy lifestyle and getting preventive care are important in promoting health and wellness.  Follow your health care provider's instructions about healthy  diet, exercising, and getting tested or screened for diseases.  Follow your health care provider's instructions on monitoring your cholesterol and blood pressure. This information is not intended to replace advice given to you by your health care provider. Make sure you discuss any questions you have with your health care provider. Document Revised: 09/09/2018 Document Reviewed: 09/09/2018 Elsevier Patient Education  2021 Elsevier Inc.  

## 2021-02-08 LAB — COMPREHENSIVE METABOLIC PANEL
ALT: 14 IU/L (ref 0–32)
AST: 20 IU/L (ref 0–40)
Albumin/Globulin Ratio: 1.2 (ref 1.2–2.2)
Albumin: 4.4 g/dL (ref 3.8–4.8)
Alkaline Phosphatase: 84 IU/L (ref 44–121)
BUN/Creatinine Ratio: 19 (ref 9–23)
BUN: 17 mg/dL (ref 6–24)
Bilirubin Total: 0.2 mg/dL (ref 0.0–1.2)
CO2: 29 mmol/L (ref 20–29)
Calcium: 9.5 mg/dL (ref 8.7–10.2)
Chloride: 95 mmol/L — ABNORMAL LOW (ref 96–106)
Creatinine, Ser: 0.88 mg/dL (ref 0.57–1.00)
Globulin, Total: 3.6 g/dL (ref 1.5–4.5)
Glucose: 79 mg/dL (ref 65–99)
Potassium: 4.2 mmol/L (ref 3.5–5.2)
Sodium: 144 mmol/L (ref 134–144)
Total Protein: 8 g/dL (ref 6.0–8.5)
eGFR: 81 mL/min/{1.73_m2} (ref >59)

## 2021-02-08 LAB — LIPID PANEL W/O CHOL/HDL RATIO
Cholesterol, Total: 272 mg/dL — ABNORMAL HIGH (ref 100–199)
HDL: 60 mg/dL (ref >39)
LDL Chol Calc (NIH): 180 mg/dL — ABNORMAL HIGH (ref 0–99)
Triglycerides: 172 mg/dL — ABNORMAL HIGH (ref 0–149)
VLDL Cholesterol Cal: 32 mg/dL (ref 5–40)

## 2021-02-08 LAB — CBC WITH DIFFERENTIAL/PLATELET
Basophils Absolute: 0 10*3/uL (ref 0.0–0.2)
Basos: 1 %
EOS (ABSOLUTE): 0.2 10*3/uL (ref 0.0–0.4)
Eos: 3 %
Hematocrit: 35.4 % (ref 34.0–46.6)
Hemoglobin: 11.5 g/dL (ref 11.1–15.9)
Immature Grans (Abs): 0 10*3/uL (ref 0.0–0.1)
Immature Granulocytes: 0 %
Lymphocytes Absolute: 2.2 10*3/uL (ref 0.7–3.1)
Lymphs: 33 %
MCH: 25.7 pg — ABNORMAL LOW (ref 26.6–33.0)
MCHC: 32.5 g/dL (ref 31.5–35.7)
MCV: 79 fL (ref 79–97)
Monocytes Absolute: 0.6 10*3/uL (ref 0.1–0.9)
Monocytes: 9 %
Neutrophils Absolute: 3.5 10*3/uL (ref 1.4–7.0)
Neutrophils: 54 %
Platelets: 337 10*3/uL (ref 150–450)
RBC: 4.48 x10E6/uL (ref 3.77–5.28)
RDW: 13.6 % (ref 11.7–15.4)
WBC: 6.6 10*3/uL (ref 3.4–10.8)

## 2021-02-08 LAB — VITAMIN D 25 HYDROXY (VIT D DEFICIENCY, FRACTURES): Vit D, 25-Hydroxy: 18.2 ng/mL — ABNORMAL LOW (ref 30.0–100.0)

## 2021-02-08 LAB — D-DIMER, QUANTITATIVE: D-DIMER: 0.6 mg/L FEU — ABNORMAL HIGH (ref 0.00–0.49)

## 2021-02-08 LAB — VITAMIN B12: Vitamin B-12: 622 pg/mL (ref 232–1245)

## 2021-02-08 LAB — TSH: TSH: 0.827 u[IU]/mL (ref 0.450–4.500)

## 2021-02-11 ENCOUNTER — Other Ambulatory Visit: Payer: Self-pay | Admitting: Family Medicine

## 2021-02-11 DIAGNOSIS — M79604 Pain in right leg: Secondary | ICD-10-CM

## 2021-02-11 DIAGNOSIS — M79605 Pain in left leg: Secondary | ICD-10-CM

## 2021-02-11 DIAGNOSIS — R7989 Other specified abnormal findings of blood chemistry: Secondary | ICD-10-CM

## 2021-02-11 MED ORDER — VITAMIN D (ERGOCALCIFEROL) 1.25 MG (50000 UNIT) PO CAPS: 50000.0000 [IU] | ORAL_CAPSULE | ORAL | 1 refills | Status: DC

## 2021-02-11 NOTE — Assessment & Plan Note (Signed)
Under good control on current regimen. Continue current regimen. Continue to monitor. Call with any concerns. Refills given. Labs drawn today.   

## 2021-02-11 NOTE — Assessment & Plan Note (Signed)
Rechecking labs today. Await results. Treat as needed.  °

## 2021-02-13 ENCOUNTER — Other Ambulatory Visit: Payer: Self-pay | Admitting: Family Medicine

## 2021-02-13 DIAGNOSIS — M79604 Pain in right leg: Secondary | ICD-10-CM

## 2021-02-13 DIAGNOSIS — R7989 Other specified abnormal findings of blood chemistry: Secondary | ICD-10-CM

## 2021-02-13 DIAGNOSIS — M79605 Pain in left leg: Secondary | ICD-10-CM

## 2021-02-15 ENCOUNTER — Other Ambulatory Visit: Payer: Self-pay

## 2021-02-15 ENCOUNTER — Ambulatory Visit
Admission: RE | Admit: 2021-02-15 | Discharge: 2021-02-15 | Disposition: A | Discharge status: Home / Self Care | Payer: 59 | Source: Ambulatory Visit | Attending: Family Medicine | Admitting: Family Medicine

## 2021-02-15 DIAGNOSIS — R7989 Other specified abnormal findings of blood chemistry: Secondary | ICD-10-CM | POA: Insufficient documentation

## 2021-02-15 DIAGNOSIS — M79604 Pain in right leg: Secondary | ICD-10-CM | POA: Diagnosis not present

## 2021-02-15 DIAGNOSIS — M79605 Pain in left leg: Secondary | ICD-10-CM | POA: Insufficient documentation

## 2021-03-06 ENCOUNTER — Encounter: Payer: Self-pay | Admitting: Psychology

## 2021-04-16 ENCOUNTER — Encounter: Payer: Self-pay | Admitting: Nurse Practitioner

## 2021-04-16 ENCOUNTER — Telehealth (INDEPENDENT_AMBULATORY_CARE_PROVIDER_SITE_OTHER): Payer: 59 | Admitting: Nurse Practitioner

## 2021-04-16 DIAGNOSIS — J069 Acute upper respiratory infection, unspecified: Secondary | ICD-10-CM

## 2021-04-16 MED ORDER — BENZONATATE 200 MG PO CAPS: 200.0000 mg | ORAL_CAPSULE | Freq: Three times a day (TID) | ORAL | 0 refills | Status: DC | PRN

## 2021-04-16 MED ORDER — PREDNISONE 10 MG PO TABS: ORAL_TABLET | ORAL | 0 refills | Status: DC
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
## 2021-05-01 ENCOUNTER — Other Ambulatory Visit: Payer: Self-pay | Admitting: Family Medicine

## 2021-05-01 NOTE — Telephone Encounter (Signed)
Too soon last RF 02/07/21 #90 1 RF

## 2021-05-02 ENCOUNTER — Other Ambulatory Visit: Payer: Self-pay

## 2021-05-02 ENCOUNTER — Encounter: Payer: Self-pay | Admitting: Psychology

## 2021-05-02 ENCOUNTER — Encounter: Payer: 59 | Attending: Psychology | Admitting: Psychology

## 2021-05-02 DIAGNOSIS — Z9884 Bariatric surgery status: Secondary | ICD-10-CM | POA: Diagnosis present

## 2021-05-02 DIAGNOSIS — G43009 Migraine without aura, not intractable, without status migrainosus: Secondary | ICD-10-CM | POA: Diagnosis present

## 2021-05-02 DIAGNOSIS — M797 Fibromyalgia: Secondary | ICD-10-CM | POA: Diagnosis present

## 2021-05-02 DIAGNOSIS — F4310 Post-traumatic stress disorder, unspecified: Secondary | ICD-10-CM | POA: Diagnosis not present

## 2021-05-02 DIAGNOSIS — F0781 Postconcussional syndrome: Secondary | ICD-10-CM | POA: Diagnosis present

## 2021-05-02 DIAGNOSIS — F419 Anxiety disorder, unspecified: Secondary | ICD-10-CM | POA: Diagnosis present

## 2021-05-02 DIAGNOSIS — R41840 Attention and concentration deficit: Secondary | ICD-10-CM | POA: Insufficient documentation

## 2021-05-02 DIAGNOSIS — R5382 Chronic fatigue, unspecified: Secondary | ICD-10-CM | POA: Insufficient documentation

## 2021-05-02 DIAGNOSIS — R479 Unspecified speech disturbances: Secondary | ICD-10-CM | POA: Diagnosis present
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
## 2021-05-17 ENCOUNTER — Other Ambulatory Visit: Payer: Self-pay

## 2021-05-17 DIAGNOSIS — R41840 Attention and concentration deficit: Secondary | ICD-10-CM | POA: Diagnosis not present

## 2021-05-17 NOTE — Progress Notes (Signed)
Behavioral Observation  The patient appeared well-groomed and appropriately dressed for the testing session. Her manners were polite and appropriate to the situation. She demonstrated a positive attitude toward testing and showed good effort. The patient reported that she often has difficulty finding the words she wants to say.  Neuropsychology Note  Tina Harvey completed 210 minutes of neuropsychological testing with technician, Dina Rich, BA, under the supervision of Ilean Skill, PsyD., Clinical Neuropsychologist. The patient did not appear overtly distressed by the testing session, per behavioral observation or via self-report to the technician. Rest breaks were offered.   Clinical Decision Making: In considering the patient's current level of functioning, level of presumed impairment, nature of symptoms, emotional and behavioral responses during clinical interview, level of literacy, and observed level of motivation/effort, a battery of tests was selected by Dr. Sima Matas during initial consultation on 05/02/2021. This was communicated to the technician. Communication between the neuropsychologist and technician was ongoing throughout the testing session and changes were made as deemed necessary based on patient performance on testing, technician observations and additional pertinent factors such as those listed above.  Tests Administered: Controlled Oral Word Association Test (COWAT; FAS & Animals)  Wechsler Adult Intelligence Scale, 4th Edition (WAIS-IV) Wechsler Memory Scale, 4th Edition (WMS-IV); Adult Battery    Results:  WAIS-IV  Composite Score Summary  Scale Sum of Scaled Scores Composite Score Percentile Rank 95% Conf. Interval Qualitative Description  Verbal Comprehension 32 VCI 103 58 97-109 Average  Perceptual Reasoning 26 PRI 92 30 86-99 Average  Working Memory 20 WMI 100 50 93-107 Average  Processing Speed 21 PSI 102 55 93-110 Average  Full Scale 99 FSIQ 99  47 95-103 Average  General Ability 58 GAI 98 45 93-103 Average      Verbal Comprehension Subtests Summary  Subtest Raw Score Scaled Score Percentile Rank Reference Group Scaled Score SEM  Similarities 28 11 63 11 1.04  Vocabulary 43 11 63 12 0.73  Information 14 10 50 11 0.73  (Comprehension) 30 12 75 13 1.16       Perceptual Reasoning Subtests Summary  Subtest Raw Score Scaled Score Percentile Rank Reference Group Scaled Score SEM  Block Design 32 '8 25 7 '$ 0.95  Matrix Reasoning '13 8 25 7 '$ 0.95  Visual Puzzles 14 10 50 9 0.85  (Figure Weights) 18 13 84 11 0.90  (Picture Completion) 11 9 37 8 1.24       Working Doctor, general practice Raw Score Scaled Score Percentile Rank Reference Group Scaled Score SEM  Digit Span 29 11 63 10 0.73  Arithmetic 13 9 37 9 0.90  (Letter-Number Seq.) 23 12 75 12 1.04       Processing Speed Subtests Summary  Subtest Raw Score Scaled Score Percentile Rank Reference Group Scaled Score SEM  Symbol Search 34 11 63 10 1.56  Coding 63 10 50 8 1.20  (Cancellation) 50 14 91 13 1.62       WMS-IV Adult Battery  Index Score Summary  Index Sum of Scaled Scores Index Score Percentile Rank 95% Confidence Interval Qualitative Descriptor  Auditory Memory (AMI) 39 98 45 92-104 Average  Visual Memory (VMI) 44 105 63 99-110 Average  Visual Working Memory (VWMI) 21 103 58 96-110 Average  Immediate Memory (IMI) 45 108 70 101-114 Average  Delayed Memory (DMI) 38 96 39 89-103 Average      Primary Subtest Scaled Score Summary  Subtest Domain Raw Score Scaled Score Percentile Rank  Logical Memory  I AM 34 13 84  Logical Memory II AM '16 8 25  '$ Verbal Paired Associates I AM '25 8 25  '$ Verbal Paired Associates II AM 10 10 50  Designs I VM 72 11 63  Designs II VM 56 10 50  Visual Reproduction I VM 41 13 84  Visual Reproduction II VM 26 10 50  Spatial Addition VWM 13 10 50  Symbol Span VWM 26 11 63      Auditory Memory Process Score  Summary  Process Score Raw Score Scaled Score Percentile Rank Cumulative Percentage (Base Rate)  LM II Recognition 26 - - >75%  VPA II Recognition 40 - - >75%       Visual Memory Process Score Summary  Process Score Raw Score Scaled Score Percentile Rank Cumulative Percentage (Base Rate)  DE I Content 37 11 63 -  DE I Spatial 15 9 37 -  DE II Content 37 12 75 -  DE II Spatial 11 9 37 -  DE II Recognition 15 - - 51-75%  VR II Recognition 5 - - 26-50%     ABILITY-MEMORY ANALYSIS  Ability Score:  VCI: 103 Date of Testing:  WAIS-IV; WMS-IV 2021/05/17  Predicted Difference Method   Index Predicted WMS-IV Index Score Actual WMS-IV Index Score Difference Critical Value  Significant Difference Y/N Base Rate  Auditory Memory 102 98 4 9.43 N   Visual Memory 101 105 -4 9.19 N   Visual Working Memory 102 103 -1 11.15 N   Immediate Memory 102 108 -6 10.27 N   Delayed Memory 102 96 6 10.03 N   Statistical significance (critical value) at the .01 level.       COWAT FAS Total = 40 Z= -0.419 Animals = 18 Z= -0.722  Will be included in final report   Feedback to Patient: Tina Harvey will return on 05/24/2021 for further testing and again on 06/21/2021 for an interactive feedback session with Dr. Sima Matas at which time her test performances, clinical impressions and treatment recommendations will be reviewed in detail. The patient understands she can contact our office should she require our assistance before this time.  210 minutes spent face-to-face with patient administering standardized tests, 30 minutes spent scoring Environmental education officer). [CPT [id number], [id number]  Full report to follow.

## 2021-05-24 ENCOUNTER — Other Ambulatory Visit: Payer: Self-pay

## 2021-05-24 DIAGNOSIS — R41840 Attention and concentration deficit: Secondary | ICD-10-CM | POA: Diagnosis not present

## 2021-05-24 NOTE — Progress Notes (Signed)
   Behavioral Observation   The patient appeared well-groomed and appropriately dressed for the testing session. Her manners were polite and appropriate to the situation. She demonstrated a positive attitude toward testing, showed good effort, and complied with all testing instructions.   Neuropsychology Note  Tina Harvey completed 90 minutes of neuropsychological testing with technician, Tina Harvey, BA, under the supervision of Tina Skill, PsyD., Clinical Neuropsychologist. The patient did not appear overtly distressed by the testing session, per behavioral observation or via self-report to the technician. Rest breaks were offered.   Clinical Decision Making: In considering the patient's current level of functioning, level of presumed impairment, nature of symptoms, emotional and behavioral responses during clinical interview, level of literacy, and observed level of motivation/effort, a battery of tests was selected by Dr. Sima Matas during initial consultation on 05/02/2021. This was communicated to the technician. Communication between the neuropsychologist and technician was ongoing throughout the testing session and changes were made as deemed necessary based on patient performance on testing, technician observations and additional pertinent factors such as those listed above.  Tests Administered: Comprehensive Attention Battery (CAB) Continuous Performance Test (CPT)   Results: Will be included in final report   Feedback to Patient: Tina Harvey will return on 06/21/2021 for an interactive feedback session with Dr. Sima Matas at which time her test performances, clinical impressions and treatment recommendations will be reviewed in detail. The patient understands she can contact our office should she require our assistance before this time.  90 minutes spent face-to-face with patient administering standardized tests, 30 minutes spent scoring Environmental education officer). [CPT [id number], [id number]  Full  report to follow.

## 2021-06-12 ENCOUNTER — Other Ambulatory Visit: Payer: Self-pay

## 2021-06-12 ENCOUNTER — Encounter: Payer: 59 | Attending: Psychology | Admitting: Psychology

## 2021-06-12 ENCOUNTER — Encounter: Payer: Self-pay | Admitting: Psychology

## 2021-06-12 DIAGNOSIS — F0781 Postconcussional syndrome: Secondary | ICD-10-CM | POA: Insufficient documentation

## 2021-06-12 DIAGNOSIS — R41840 Attention and concentration deficit: Secondary | ICD-10-CM | POA: Insufficient documentation

## 2021-06-12 DIAGNOSIS — F4310 Post-traumatic stress disorder, unspecified: Secondary | ICD-10-CM | POA: Diagnosis present

## 2021-06-12 DIAGNOSIS — F419 Anxiety disorder, unspecified: Secondary | ICD-10-CM | POA: Insufficient documentation

## 2021-06-12 DIAGNOSIS — R5382 Chronic fatigue, unspecified: Secondary | ICD-10-CM | POA: Diagnosis present

## 2021-06-12 DIAGNOSIS — M797 Fibromyalgia: Secondary | ICD-10-CM | POA: Insufficient documentation
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
## 2021-06-21 ENCOUNTER — Encounter (HOSPITAL_BASED_OUTPATIENT_CLINIC_OR_DEPARTMENT_OTHER): Payer: 59 | Admitting: Psychology

## 2021-06-21 ENCOUNTER — Other Ambulatory Visit: Payer: Self-pay

## 2021-06-21 DIAGNOSIS — R41840 Attention and concentration deficit: Secondary | ICD-10-CM | POA: Diagnosis not present

## 2021-06-21 DIAGNOSIS — F4310 Post-traumatic stress disorder, unspecified: Secondary | ICD-10-CM

## 2021-06-21 DIAGNOSIS — F0781 Postconcussional syndrome: Secondary | ICD-10-CM

## 2021-06-21 DIAGNOSIS — R5382 Chronic fatigue, unspecified: Secondary | ICD-10-CM

## 2021-06-21 DIAGNOSIS — M797 Fibromyalgia: Secondary | ICD-10-CM

## 2021-06-21 DIAGNOSIS — F419 Anxiety disorder, unspecified: Secondary | ICD-10-CM | POA: Diagnosis not present
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
## 2021-07-24 ENCOUNTER — Ambulatory Visit: Payer: 59 | Admitting: Student in an Organized Health Care Education/Training Program

## 2021-08-10 ENCOUNTER — Ambulatory Visit: Payer: 59 | Admitting: Family Medicine

## 2021-08-13 ENCOUNTER — Encounter: Payer: Self-pay | Admitting: Family Medicine

## 2021-08-13 ENCOUNTER — Ambulatory Visit (INDEPENDENT_AMBULATORY_CARE_PROVIDER_SITE_OTHER): Payer: 59 | Admitting: Family Medicine

## 2021-08-13 ENCOUNTER — Other Ambulatory Visit: Payer: Self-pay

## 2021-08-13 VITALS — BP 100/68 | HR 82 | Ht 66.0 in | Wt 195.0 lb

## 2021-08-13 DIAGNOSIS — F419 Anxiety disorder, unspecified: Secondary | ICD-10-CM

## 2021-08-13 DIAGNOSIS — M797 Fibromyalgia: Secondary | ICD-10-CM

## 2021-08-13 DIAGNOSIS — Z0000 Encounter for general adult medical examination without abnormal findings: Secondary | ICD-10-CM | POA: Diagnosis not present

## 2021-08-13 DIAGNOSIS — I10 Essential (primary) hypertension: Secondary | ICD-10-CM | POA: Diagnosis not present

## 2021-08-13 DIAGNOSIS — M109 Gout, unspecified: Secondary | ICD-10-CM

## 2021-08-13 DIAGNOSIS — M8000XS Age-related osteoporosis with current pathological fracture, unspecified site, sequela: Secondary | ICD-10-CM

## 2021-08-13 DIAGNOSIS — E782 Mixed hyperlipidemia: Secondary | ICD-10-CM | POA: Diagnosis not present

## 2021-08-13 DIAGNOSIS — E559 Vitamin D deficiency, unspecified: Secondary | ICD-10-CM

## 2021-08-13 DIAGNOSIS — E538 Deficiency of other specified B group vitamins: Secondary | ICD-10-CM

## 2021-08-13 DIAGNOSIS — F5089 Other specified eating disorder: Secondary | ICD-10-CM

## 2021-08-13 DIAGNOSIS — Z1231 Encounter for screening mammogram for malignant neoplasm of breast: Secondary | ICD-10-CM

## 2021-08-13 DIAGNOSIS — K219 Gastro-esophageal reflux disease without esophagitis: Secondary | ICD-10-CM

## 2021-08-13 MED ORDER — IBANDRONATE SODIUM 150 MG PO TABS: 150.0000 mg | ORAL_TABLET | ORAL | 4 refills | Status: DC

## 2021-08-13 MED ORDER — ESCITALOPRAM OXALATE 10 MG PO TABS: 10.0000 mg | ORAL_TABLET | Freq: Every day | ORAL | 1 refills | Status: DC

## 2021-08-13 MED ORDER — BUSPIRONE HCL 5 MG PO TABS: 5.0000 mg | ORAL_TABLET | Freq: Three times a day (TID) | ORAL | 6 refills | Status: DC

## 2021-08-13 MED ORDER — ATORVASTATIN CALCIUM 10 MG PO TABS: 10.0000 mg | ORAL_TABLET | Freq: Every day | ORAL | 1 refills | Status: DC

## 2021-08-13 MED ORDER — EPINEPHRINE 0.3 MG/0.3ML IJ SOAJ: 0.3000 mg | Freq: Once | INTRAMUSCULAR | 12 refills | Status: DC

## 2021-08-13 MED ORDER — LISINOPRIL-HYDROCHLOROTHIAZIDE 20-25 MG PO TABS: 1.0000 | ORAL_TABLET | Freq: Every day | ORAL | 1 refills | Status: DC

## 2021-08-13 MED ORDER — "SYRINGE/NEEDLE (DISP) 25G X 1"" 1 ML MISC": 12 refills | Status: AC

## 2021-08-13 MED ORDER — PANTOPRAZOLE SODIUM 40 MG PO TBEC: 40.0000 mg | DELAYED_RELEASE_TABLET | Freq: Every day | ORAL | 1 refills | Status: DC
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
## 2021-08-13 NOTE — Assessment & Plan Note (Signed)
Checking labs today. Await results. Treat as needed.  

## 2021-08-13 NOTE — Assessment & Plan Note (Signed)
Under good control on current regimen. Continue current regimen. Continue to monitor. Call with any concerns. Refills given. Labs drawn today.   

## 2021-08-13 NOTE — Assessment & Plan Note (Signed)
Under good control on current regimen. Continue current regimen. Continue to monitor. Call with any concerns. Refills given.   

## 2021-08-13 NOTE — Assessment & Plan Note (Signed)
Due for recheck in December. Ordered today. Await results. Refills given.

## 2021-08-13 NOTE — Patient Instructions (Signed)
Please call to schedule your mammogram and/or bone density: °[facility]  °Address: [address], [place], [place] [address]  °Phone: [phone] ° °

## 2021-08-14 LAB — URINALYSIS, ROUTINE W REFLEX MICROSCOPIC
Bilirubin, UA: NEGATIVE
Ketones, UA: NEGATIVE
Nitrite, UA: NEGATIVE
Protein,UA: NEGATIVE
RBC, UA: NEGATIVE
Specific Gravity, UA: 1.02 (ref 1.005–1.030)
Urobilinogen, Ur: 0.2 mg/dL (ref 0.2–1.0)
pH, UA: 6 (ref 5.0–7.5)

## 2021-08-14 LAB — LIPID PANEL W/O CHOL/HDL RATIO
Cholesterol, Total: 185 mg/dL (ref 100–199)
HDL: 54 mg/dL (ref >39)
LDL Chol Calc (NIH): 102 mg/dL — ABNORMAL HIGH (ref 0–99)
Triglycerides: 165 mg/dL — ABNORMAL HIGH (ref 0–149)
VLDL Cholesterol Cal: 29 mg/dL (ref 5–40)

## 2021-08-14 LAB — COMPREHENSIVE METABOLIC PANEL
ALT: 13 IU/L (ref 0–32)
AST: 24 IU/L (ref 0–40)
Albumin/Globulin Ratio: 1.5 (ref 1.2–2.2)
Albumin: 4.5 g/dL (ref 3.8–4.8)
Alkaline Phosphatase: 98 IU/L (ref 44–121)
BUN/Creatinine Ratio: 14 (ref 9–23)
BUN: 14 mg/dL (ref 6–24)
Bilirubin Total: 0.3 mg/dL (ref 0.0–1.2)
CO2: 29 mmol/L (ref 20–29)
Calcium: 9.7 mg/dL (ref 8.7–10.2)
Chloride: 96 mmol/L (ref 96–106)
Creatinine, Ser: 0.99 mg/dL (ref 0.57–1.00)
Globulin, Total: 3.1 g/dL (ref 1.5–4.5)
Glucose: 210 mg/dL — ABNORMAL HIGH (ref 70–99)
Potassium: 3.5 mmol/L (ref 3.5–5.2)
Sodium: 140 mmol/L (ref 134–144)
Total Protein: 7.6 g/dL (ref 6.0–8.5)
eGFR: 70 mL/min/{1.73_m2} (ref >59)

## 2021-08-14 LAB — MICROALBUMIN, URINE WAIVED
Creatinine, Urine Waived: 200 mg/dL (ref 10–300)
Microalb, Ur Waived: 10 mg/L (ref 0–19)
Microalb/Creat Ratio: <30 mg/g (ref <30)

## 2021-08-14 LAB — CBC WITH DIFFERENTIAL/PLATELET
Basophils Absolute: 0.1 10*3/uL (ref 0.0–0.2)
Basos: 1 %
EOS (ABSOLUTE): 0.3 10*3/uL (ref 0.0–0.4)
Eos: 3 %
Hematocrit: 35.6 % (ref 34.0–46.6)
Hemoglobin: 11.3 g/dL (ref 11.1–15.9)
Immature Grans (Abs): 0 10*3/uL (ref 0.0–0.1)
Immature Granulocytes: 0 %
Lymphocytes Absolute: 2.7 10*3/uL (ref 0.7–3.1)
Lymphs: 27 %
MCH: 26.2 pg — ABNORMAL LOW (ref 26.6–33.0)
MCHC: 31.7 g/dL (ref 31.5–35.7)
MCV: 83 fL (ref 79–97)
Monocytes Absolute: 0.8 10*3/uL (ref 0.1–0.9)
Monocytes: 8 %
Neutrophils Absolute: 6.1 10*3/uL (ref 1.4–7.0)
Neutrophils: 61 %
Platelets: 317 10*3/uL (ref 150–450)
RBC: 4.31 x10E6/uL (ref 3.77–5.28)
RDW: 14.3 % (ref 11.7–15.4)
WBC: 9.9 10*3/uL (ref 3.4–10.8)

## 2021-08-14 LAB — MICROSCOPIC EXAMINATION

## 2021-08-14 LAB — VITAMIN D 25 HYDROXY (VIT D DEFICIENCY, FRACTURES): Vit D, 25-Hydroxy: 27.3 ng/mL — ABNORMAL LOW (ref 30.0–100.0)

## 2021-08-14 LAB — IRON AND TIBC
Iron Saturation: 9 % — CL (ref 15–55)
Iron: 42 ug/dL (ref 27–159)
Total Iron Binding Capacity: 463 ug/dL — ABNORMAL HIGH (ref 250–450)
UIBC: 421 ug/dL (ref 131–425)

## 2021-08-14 LAB — URIC ACID: Uric Acid: 5.9 mg/dL (ref 2.6–6.2)

## 2021-08-14 LAB — FERRITIN: Ferritin: 11 ng/mL — ABNORMAL LOW (ref 15–150)

## 2021-08-14 LAB — TSH: TSH: 1.44 u[IU]/mL (ref 0.450–4.500)

## 2021-09-18 ENCOUNTER — Other Ambulatory Visit: Payer: Self-pay

## 2021-09-18 ENCOUNTER — Ambulatory Visit (INDEPENDENT_AMBULATORY_CARE_PROVIDER_SITE_OTHER): Payer: 59 | Admitting: Nurse Practitioner

## 2021-09-18 ENCOUNTER — Encounter: Payer: Self-pay | Admitting: Nurse Practitioner

## 2021-09-18 VITALS — BP 132/84 | HR 65 | Temp 98.0°F | Ht 65.98 in | Wt 195.8 lb

## 2021-09-18 DIAGNOSIS — E538 Deficiency of other specified B group vitamins: Secondary | ICD-10-CM | POA: Diagnosis not present

## 2021-09-18 DIAGNOSIS — G43009 Migraine without aura, not intractable, without status migrainosus: Secondary | ICD-10-CM | POA: Diagnosis not present

## 2021-09-18 MED ORDER — AIMOVIG 140 MG/ML ~~LOC~~ SOAJ: 140.0000 mg | SUBCUTANEOUS | 6 refills | Status: DC

## 2021-09-18 MED ORDER — CYANOCOBALAMIN 1000 MCG/ML IJ SOLN: 1000.0000 ug | INTRAMUSCULAR | 5 refills | Status: DC
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
## 2021-09-18 NOTE — Assessment & Plan Note (Signed)
Chronic, well controlled with aimovig. Will send in refill. Continue collaboration and recommendations from neurology.

## 2021-09-18 NOTE — Assessment & Plan Note (Signed)
Latest B12 lab result reviewed. Will continue vitamin B12 injections monthly. Refill sent to the pharmacy.

## 2021-10-02 ENCOUNTER — Telehealth: Payer: Self-pay | Admitting: Family Medicine

## 2021-10-02 NOTE — Telephone Encounter (Signed)
[employer] [facility] [place] is calling to request a prior authorization on AIMOVIG 140 MG/ML SOAJ [[id number]] Pt states that she will be in [place] for 6 months.   [phone]

## 2021-10-02 NOTE — Telephone Encounter (Signed)
Pt calling in stating that pharmacy needs prior auth for AIMOVIG  medication, needs it sent to [facility] :Pharmacy [address] [place] [address] phone: [phone]

## 2021-10-03 NOTE — Telephone Encounter (Signed)
PA for Aimovig initiated and submitted via Cover My Meds. Key: [id number]

## 2021-10-04 NOTE — Telephone Encounter (Signed)
PA approved. Called and notified patient of approval.  

## 2021-11-08 ENCOUNTER — Ambulatory Visit: Payer: 59 | Admitting: Psychology

## 2021-11-12 ENCOUNTER — Ambulatory Visit: Payer: 59 | Admitting: Psychology

## 2021-12-20 ENCOUNTER — Encounter: Payer: Self-pay | Admitting: Internal Medicine

## 2021-12-20 ENCOUNTER — Other Ambulatory Visit: Payer: Self-pay

## 2021-12-20 ENCOUNTER — Ambulatory Visit: Payer: Self-pay | Admitting: Internal Medicine

## 2021-12-20 VITALS — BP 113/71 | HR 86 | Temp 98.4°F | Wt 188.8 lb

## 2021-12-20 DIAGNOSIS — Z8639 Personal history of other endocrine, nutritional and metabolic disease: Secondary | ICD-10-CM | POA: Insufficient documentation

## 2021-12-20 DIAGNOSIS — R202 Paresthesia of skin: Secondary | ICD-10-CM

## 2021-12-20 DIAGNOSIS — Z9884 Bariatric surgery status: Secondary | ICD-10-CM

## 2021-12-20 DIAGNOSIS — D649 Anemia, unspecified: Secondary | ICD-10-CM | POA: Insufficient documentation

## 2021-12-20 LAB — CBC WITH DIFFERENTIAL/PLATELET
Hematocrit: 35.1 % (ref 34.0–46.6)
Hemoglobin: 11.7 g/dL (ref 11.1–15.9)
Lymphocytes Absolute: 2.3 10*3/uL (ref 0.7–3.1)
Lymphs: 33 %
MCH: 27 pg (ref 26.6–33.0)
MCHC: 33.3 g/dL (ref 31.5–35.7)
MCV: 81 fL (ref 79–97)
MID (Absolute): 0.7 10*3/uL (ref 0.1–1.6)
MID: 10 %
Neutrophils Absolute: 3.9 10*3/uL (ref 1.4–7.0)
Neutrophils: 57 %
Platelets: 325 10*3/uL (ref 150–450)
RBC: 4.33 x10E6/uL (ref 3.77–5.28)
RDW: 14.8 % (ref 11.7–15.4)
WBC: 6.9 10*3/uL (ref 3.4–10.8)

## 2021-12-20 LAB — BAYER DCA HB A1C WAIVED: HB A1C (BAYER DCA - WAIVED): 5.2 % (ref 4.8–5.6)

## 2021-12-20 NOTE — Progress Notes (Signed)
? ?BP 113/71   Pulse 86   Temp 98.4 ?F (36.9 ?C) (Oral)   Wt 188 lb 12.8 oz (85.6 kg)   SpO2 99%   BMI 30.49 kg/m?   ? ?Subjective:  ? ? Patient ID: Tina Harvey, female    DOB: May 22, 1971, 51 y.o.   MRN: [id number] ? ?Chief Complaint  ?Patient presents with  ? Fatigue  ?  Dry skin, freezing, foggy, and gaining weight, and wants lab work  ? ? ?HPI: ?Tina Harvey is a 51 y.o. female ? ?Pt says she is feeling cold fingers and toes stay tingling and chilly all the time. Pt has a pmh of gastric bypass. Tired fatigue,  ? ? ?Anemia ?Presents for initial visit. Symptoms include leg swelling, light-headedness, malaise/fatigue, palpitations, paresthesias and pica. There has been no abdominal pain, anorexia, bruising/bleeding easily, confusion, fever, pallor or weight loss.  ? ?Chief Complaint  ?Patient presents with  ? Fatigue  ?  Dry skin, freezing, foggy, and gaining weight, and wants lab work  ? ? ?Relevant past medical, surgical, family and social history reviewed and updated as indicated. Interim medical history since our last visit reviewed. ?Allergies and medications reviewed and updated. ? ?Review of Systems  ?Constitutional:  Positive for malaise/fatigue. Negative for fever and weight loss.  ?Cardiovascular:  Positive for palpitations.  ?Gastrointestinal:  Negative for abdominal pain and anorexia.  ?Skin:  Negative for pallor.  ?Neurological:  Positive for light-headedness and paresthesias.  ?Hematological:  Does not bruise/bleed easily.  ?Psychiatric/Behavioral:  Negative for confusion.   ? ?Per HPI unless specifically indicated above ? ?   ?Objective:  ?  ?BP 113/71   Pulse 86   Temp 98.4 ?F (36.9 ?C) (Oral)   Wt 188 lb 12.8 oz (85.6 kg)   SpO2 99%   BMI 30.49 kg/m?   ?Wt Readings from Last 3 Encounters:  ?12/20/21 188 lb 12.8 oz (85.6 kg)  ?09/18/21 195 lb 12.8 oz (88.8 kg)  ?08/13/21 195 lb (88.5 kg)  ?  ?Physical Exam ?Vitals and nursing note reviewed.  ?Constitutional:   ?   General: She is not in  acute distress. ?   Appearance: Normal appearance. She is not ill-appearing or diaphoretic.  ?Eyes:  ?   Conjunctiva/sclera: Conjunctivae normal.  ?Pulmonary:  ?   Breath sounds: No rhonchi.  ?Abdominal:  ?   General: Abdomen is flat. Bowel sounds are normal. There is no distension.  ?   Palpations: Abdomen is soft. There is no mass.  ?   Tenderness: There is no abdominal tenderness. There is no guarding.  ?Skin: ?   General: Skin is warm and dry.  ?   Coloration: Skin is not jaundiced.  ?   Findings: No erythema.  ?Neurological:  ?   Mental Status: She is alert.  ? ? ? ?   ? ? ?Current Outpatient Medications:  ?  albuterol (VENTOLIN HFA) 108 (90 Base) MCG/ACT inhaler, SMARTSIG:2 Puff(s) By Mouth 4 Times Daily PRN, Disp: , Rfl:  ?  atorvastatin (LIPITOR) 10 MG tablet, Take 1 tablet (10 mg total) by mouth daily., Disp: 90 tablet, Rfl: 1 ?  busPIRone (BUSPAR) 5 MG tablet, Take 1 tablet (5 mg total) by mouth 3 (three) times daily., Disp: 90 tablet, Rfl: 6 ?  cyanocobalamin (,VITAMIN B-12,) 1000 MCG/ML injection, Inject 1 mL (1,000 mcg total) into the muscle every 30 (thirty) days., Disp: 2 mL, Rfl: 5 ?  docusate sodium (COLACE) 100 MG capsule, Take 1 capsule (100  mg total) by mouth 2 (two) times daily., Disp: 10 capsule, Rfl: 12 ?  escitalopram (LEXAPRO) 10 MG tablet, Take 1 tablet (10 mg total) by mouth daily., Disp: 90 tablet, Rfl: 1 ?  gabapentin (NEURONTIN) 300 MG capsule, Take 300 mg by mouth 3 (three) times daily., Disp: , Rfl:  ?  ibandronate (BONIVA) 150 MG tablet, Take 1 tablet (150 mg total) by mouth every 30 (thirty) days. Take in the morning with a full glass of water, on an empty stomach, and do not take anything else by mouth or lie down for the next 30 min., Disp: 12 tablet, Rfl: 4 ?  lisinopril-hydrochlorothiazide (ZESTORETIC) 20-25 MG tablet, Take 1 tablet by mouth daily., Disp: 90 tablet, Rfl: 1 ?  Multiple Vitamin (MULTIVITAMIN) capsule, Take 1 capsule by mouth 2 (two) times daily. , Disp: , Rfl:  ?   ondansetron (ZOFRAN ODT) 4 MG disintegrating tablet, Take 1 tablet (4 mg total) by mouth every 8 (eight) hours as needed for nausea or vomiting. (Patient taking differently: Take 8 mg by mouth every 8 (eight) hours as needed for nausea or vomiting.), Disp: 20 tablet, Rfl: 0 ?  pantoprazole (PROTONIX) 40 MG tablet, Take 1 tablet (40 mg total) by mouth daily., Disp: 90 tablet, Rfl: 1 ?  Potassium 99 MG TABS, Take by mouth., Disp: , Rfl:  ?  promethazine (PHENERGAN) 25 MG tablet, Take 25 mg by mouth every 6 (six) hours as needed., Disp: , Rfl:  ?  sucralfate (CARAFATE) 1 GM/10ML suspension, SMARTSIG:2 Teaspoon By Mouth 4 Times Daily, Disp: , Rfl:  ?  Syringe/Needle, Disp, 25G X 1" 1 ML MISC, Patient is to use the needle and syringe with cyanocobalamin 1073mg per 156mevery 14 days, Disp: 25 each, Rfl: 12 ?  tiZANidine (ZANAFLEX) 2 MG tablet, Take by mouth., Disp: , Rfl:  ?  Vitamin D, Ergocalciferol, (DRISDOL) 1.25 MG (50000 UNIT) CAPS capsule, Take 1 capsule (50,000 Units total) by mouth once a week., Disp: 12 capsule, Rfl: 1 ?  XIIDRA 5 % SOLN, INSTILL 1 DROP INTO EACH EYE TWICE DAILY, Disp: , Rfl:  ?  zonisamide (ZONEGRAN) 100 MG capsule, Take 300 mg by mouth daily., Disp: , Rfl:  ?  AIMOVIG 140 MG/ML SOAJ, Inject 140 mg into the skin every 28 (twenty-eight) days. (Patient not taking: Reported on 12/20/2021), Disp: 1.12 mL, Rfl: 6 ?  EMGALITY 120 MG/ML SOAJ, SMARTSIG:120 Milligram(s) SUB-Q Once a Month, Disp: , Rfl:  ?  EPINEPHrine 0.3 mg/0.3 mL IJ SOAJ injection, Inject 0.3 mg into the muscle once for 1 dose., Disp: 0.3 mL, Rfl: 12 ?  ketorolac (TORADOL) 10 MG tablet, Take 10 mg by mouth every 6 (six) hours as needed. (Patient not taking: Reported on 12/20/2021), Disp: , Rfl:   ? ? ?Assessment & Plan:  ? ?Fatigue/ Dry skin ? Sec to hypothyroidism ?Will check labs as below ?Check for anemia as well since symtpoms ? Sec to such pt has a ho gastric bypass ? Neuropathy sec to b12 def ? ? ?Problem List Items Addressed  This Visit   ? ?  ? Other  ? Anemia - Primary  ? Relevant Orders  ? CBC With Differential/Platelet (Completed)  ? Vitamin B12 (Completed)  ? Folate (Completed)  ? CBC with Differential/Platelet (Completed)  ? Ferritin (Completed)  ? Iron and TIBC (Completed)  ? TSH (Completed)  ? Ambulatory referral to Hematology / Oncology  ? H/O gastric bypass  ? Relevant Orders  ? CBC With Differential/Platelet (Completed)  ?  Vitamin B12 (Completed)  ? Folate (Completed)  ? CBC with Differential/Platelet (Completed)  ? Ferritin (Completed)  ? Iron and TIBC (Completed)  ? TSH (Completed)  ? Ambulatory referral to Hematology / Oncology  ? Tingling in extremities  ? Relevant Orders  ? Bayer DCA Hb A1c Waived (STAT) (Completed)  ? H/O diabetes mellitus  ?  ? ?Orders Placed This Encounter  ?Procedures  ? CBC With Differential/Platelet  ? Vitamin B12  ? Folate  ? CBC with Differential/Platelet  ? Ferritin  ? Iron and TIBC  ? TSH  ? Bayer DCA Hb A1c Waived (STAT)  ? Ambulatory referral to Hematology / Oncology  ?  ? ?No orders of the defined types were placed in this encounter. ?  ? ?Follow up plan: ?No follow-ups on file. ? ? ? ?

## 2021-12-21 LAB — CBC WITH DIFFERENTIAL/PLATELET
Basophils Absolute: 0 10*3/uL (ref 0.0–0.2)
Basos: 1 %
EOS (ABSOLUTE): 0.3 10*3/uL (ref 0.0–0.4)
Eos: 4 %
Hematocrit: 34.1 % (ref 34.0–46.6)
Hemoglobin: 11.5 g/dL (ref 11.1–15.9)
Immature Grans (Abs): 0 10*3/uL (ref 0.0–0.1)
Immature Granulocytes: 0 %
Lymphocytes Absolute: 2.1 10*3/uL (ref 0.7–3.1)
Lymphs: 32 %
MCH: 27.1 pg (ref 26.6–33.0)
MCHC: 33.7 g/dL (ref 31.5–35.7)
MCV: 80 fL (ref 79–97)
Monocytes Absolute: 0.6 10*3/uL (ref 0.1–0.9)
Monocytes: 9 %
Neutrophils Absolute: 3.6 10*3/uL (ref 1.4–7.0)
Neutrophils: 54 %
Platelets: 318 10*3/uL (ref 150–450)
RBC: 4.24 x10E6/uL (ref 3.77–5.28)
RDW: 14.1 % (ref 11.7–15.4)
WBC: 6.7 10*3/uL (ref 3.4–10.8)

## 2021-12-21 LAB — FOLATE: Folate: >20 ng/mL (ref >3.0)

## 2021-12-21 LAB — IRON AND TIBC
Iron Saturation: 18 % (ref 15–55)
Iron: 83 ug/dL (ref 27–159)
Total Iron Binding Capacity: 453 ug/dL — ABNORMAL HIGH (ref 250–450)
UIBC: 370 ug/dL (ref 131–425)

## 2021-12-21 LAB — VITAMIN B12: Vitamin B-12: 1123 pg/mL (ref 232–1245)

## 2021-12-21 LAB — FERRITIN: Ferritin: 13 ng/mL — ABNORMAL LOW (ref 15–150)

## 2021-12-21 LAB — TSH: TSH: 0.737 u[IU]/mL (ref 0.450–4.500)

## 2022-01-03 ENCOUNTER — Ambulatory Visit (INDEPENDENT_AMBULATORY_CARE_PROVIDER_SITE_OTHER): Payer: Self-pay | Admitting: Family Medicine

## 2022-01-03 ENCOUNTER — Encounter: Payer: Self-pay | Admitting: Family Medicine

## 2022-01-03 VITALS — BP 118/79 | HR 77 | Temp 97.8°F | Wt 189.0 lb

## 2022-01-03 DIAGNOSIS — D509 Iron deficiency anemia, unspecified: Secondary | ICD-10-CM

## 2022-01-03 NOTE — Progress Notes (Signed)
? ?BP 118/79   Pulse 77   Temp 97.8 ?F (36.6 ?C)   Wt 189 lb (85.7 kg)   SpO2 100%   BMI 30.52 kg/m?   ? ?Subjective:  ? ? Patient ID: Tina Harvey, female    DOB: 1971/09/27, 51 y.o.   MRN: [id number] ? ?HPI: ?Tina Harvey is a 51 y.o. female ? ?Chief Complaint  ?Patient presents with  ? Fatigue  ?  Patient was seen a week ago for feeling fatigue and cold, states she has appointment with hematology on Monday.   ? ?FATIGUE ?Duration:  months ?Severity: severe  ?Onset: gradual ?Context when symptoms started:  unknown ?Symptoms improve with rest: no  ?Depressive symptoms: yes ?Stress/anxiety: no ?Insomnia: no  ?Snoring: yes ?Observed apnea by bed partner: no ?Daytime hypersomnolence:yes ?Wakes feeling refreshed: no ?History of sleep study: yes ?Dysnea on exertion:  yes ?Orthopnea/PND: no ?Chest pain: no ?Chronic cough: no ?Lower extremity edema: yes ?Arthralgias:no ?Myalgias: no ?Weakness: no ?Rash: no ? ?Relevant past medical, surgical, family and social history reviewed and updated as indicated. Interim medical history since our last visit reviewed. ?Allergies and medications reviewed and updated. ? ?Review of Systems  ?Constitutional:  Positive for chills and fatigue. Negative for activity change, appetite change, diaphoresis, fever and unexpected weight change.  ?Respiratory:  Positive for shortness of breath. Negative for apnea, cough, choking, chest tightness, wheezing and stridor.   ?Cardiovascular: Negative.   ?Gastrointestinal: Negative.   ?Musculoskeletal: Negative.   ?Psychiatric/Behavioral: Negative.    ? ?Per HPI unless specifically indicated above ? ?   ?Objective:  ?  ?BP 118/79   Pulse 77   Temp 97.8 ?F (36.6 ?C)   Wt 189 lb (85.7 kg)   SpO2 100%   BMI 30.52 kg/m?   ?Wt Readings from Last 3 Encounters:  ?01/03/22 189 lb (85.7 kg)  ?12/20/21 188 lb 12.8 oz (85.6 kg)  ?09/18/21 195 lb 12.8 oz (88.8 kg)  ?  ?Physical Exam ?Vitals and nursing note reviewed.  ?Constitutional:   ?   General: She is  not in acute distress. ?   Appearance: Normal appearance. She is not ill-appearing, toxic-appearing or diaphoretic.  ?HENT:  ?   Head: Normocephalic and atraumatic.  ?   Right Ear: External ear normal.  ?   Left Ear: External ear normal.  ?   Nose: Nose normal.  ?   Mouth/Throat:  ?   Mouth: Mucous membranes are moist.  ?   Pharynx: Oropharynx is clear.  ?Eyes:  ?   General: No scleral icterus.    ?   Right eye: No discharge.     ?   Left eye: No discharge.  ?   Extraocular Movements: Extraocular movements intact.  ?   Conjunctiva/sclera: Conjunctivae normal.  ?   Pupils: Pupils are equal, round, and reactive to light.  ?Cardiovascular:  ?   Rate and Rhythm: Normal rate and regular rhythm.  ?   Pulses: Normal pulses.  ?   Heart sounds: Normal heart sounds. No murmur heard. ?  No friction rub. No gallop.  ?Pulmonary:  ?   Effort: Pulmonary effort is normal. No respiratory distress.  ?   Breath sounds: Normal breath sounds. No stridor. No wheezing, rhonchi or rales.  ?Chest:  ?   Chest wall: No tenderness.  ?Musculoskeletal:     ?   General: Normal range of motion.  ?   Cervical back: Normal range of motion and neck supple.  ?Skin: ?  General: Skin is warm and dry.  ?   Capillary Refill: Capillary refill takes less than 2 seconds.  ?   Coloration: Skin is not jaundiced or pale.  ?   Findings: No bruising, erythema, lesion or rash.  ?Neurological:  ?   General: No focal deficit present.  ?   Mental Status: She is alert and oriented to person, place, and time. Mental status is at baseline.  ?Psychiatric:     ?   Mood and Affect: Mood normal.     ?   Behavior: Behavior normal.     ?   Thought Content: Thought content normal.     ?   Judgment: Judgment normal.  ? ? ?Results for orders placed or performed in visit on 12/20/21  ?CBC With Differential/Platelet  ?Result Value Ref Range  ? WBC 6.9 3.4 - 10.8 x10E3/uL  ? RBC 4.33 3.77 - 5.28 x10E6/uL  ? Hemoglobin 11.7 11.1 - 15.9 g/dL  ? Hematocrit 35.1 34.0 - 46.6 %  ? MCV  81 79 - 97 fL  ? MCH 27.0 26.6 - 33.0 pg  ? MCHC 33.3 31.5 - 35.7 g/dL  ? RDW 14.8 11.7 - 15.4 %  ? Platelets 325 150 - 450 x10E3/uL  ? Neutrophils 57 Not Estab. %  ? Lymphs 33 Not Estab. %  ? MID 10 Not Estab. %  ? Neutrophils Absolute 3.9 1.4 - 7.0 x10E3/uL  ? Lymphocytes Absolute 2.3 0.7 - 3.1 x10E3/uL  ? MID (Absolute) 0.7 0.1 - 1.6 X10E3/uL  ?Vitamin B12  ?Result Value Ref Range  ? Vitamin B-12 1,123 232 - 1,245 pg/mL  ?Folate  ?Result Value Ref Range  ? Folate >20.0 >3.0 ng/mL  ?CBC with Differential/Platelet  ?Result Value Ref Range  ? WBC 6.7 3.4 - 10.8 x10E3/uL  ? RBC 4.24 3.77 - 5.28 x10E6/uL  ? Hemoglobin 11.5 11.1 - 15.9 g/dL  ? Hematocrit 34.1 34.0 - 46.6 %  ? MCV 80 79 - 97 fL  ? MCH 27.1 26.6 - 33.0 pg  ? MCHC 33.7 31.5 - 35.7 g/dL  ? RDW 14.1 11.7 - 15.4 %  ? Platelets 318 150 - 450 x10E3/uL  ? Neutrophils 54 Not Estab. %  ? Lymphs 32 Not Estab. %  ? Monocytes 9 Not Estab. %  ? Eos 4 Not Estab. %  ? Basos 1 Not Estab. %  ? Neutrophils Absolute 3.6 1.4 - 7.0 x10E3/uL  ? Lymphocytes Absolute 2.1 0.7 - 3.1 x10E3/uL  ? Monocytes Absolute 0.6 0.1 - 0.9 x10E3/uL  ? EOS (ABSOLUTE) 0.3 0.0 - 0.4 x10E3/uL  ? Basophils Absolute 0.0 0.0 - 0.2 x10E3/uL  ? Immature Granulocytes 0 Not Estab. %  ? Immature Grans (Abs) 0.0 0.0 - 0.1 x10E3/uL  ?Ferritin  ?Result Value Ref Range  ? Ferritin 13 (L) 15 - 150 ng/mL  ?Iron and TIBC  ?Result Value Ref Range  ? Total Iron Binding Capacity 453 (H) 250 - 450 ug/dL  ? UIBC 370 131 - 425 ug/dL  ? Iron 83 27 - 159 ug/dL  ? Iron Saturation 18 15 - 55 %  ?TSH  ?Result Value Ref Range  ? TSH 0.737 0.450 - 4.500 uIU/mL  ?Bayer DCA Hb A1c Waived (STAT)  ?Result Value Ref Range  ? HB A1C (BAYER DCA - WAIVED) 5.2 4.8 - 5.6 %  ? ?   ?Assessment & Plan:  ? ?Problem List Items Addressed This Visit   ? ?  ? Other  ? Anemia -  Primary  ?  Has failed oral iron supplements. Still iron deficinent with severe symptoms. Will check FOBT to make sure no occult blood loss. Seeing hematology on  Monday. Await their input, ?IV iron. Call with any concerns.  ?  ?  ? Relevant Orders  ? Fecal occult blood, imunochemical([facility])  ?  ? ?Follow up plan: ?Return As scheduled. ? ? ? ? ? ?

## 2022-01-03 NOTE — Assessment & Plan Note (Signed)
Has failed oral iron supplements. Still iron deficinent with severe symptoms. Will check FOBT to make sure no occult blood loss. Seeing hematology on Monday. Await their input, ?IV iron. Call with any concerns.  ?

## 2022-01-07 ENCOUNTER — Inpatient Hospital Stay: Payer: BLUE CROSS/BLUE SHIELD

## 2022-01-07 ENCOUNTER — Inpatient Hospital Stay: Payer: BLUE CROSS/BLUE SHIELD | Attending: Internal Medicine | Admitting: Internal Medicine

## 2022-01-07 ENCOUNTER — Encounter: Payer: Self-pay | Admitting: Internal Medicine

## 2022-01-07 ENCOUNTER — Other Ambulatory Visit: Payer: Self-pay | Admitting: Family Medicine

## 2022-01-07 DIAGNOSIS — Z87891 Personal history of nicotine dependence: Secondary | ICD-10-CM | POA: Insufficient documentation

## 2022-01-07 DIAGNOSIS — I10 Essential (primary) hypertension: Secondary | ICD-10-CM | POA: Diagnosis not present

## 2022-01-07 DIAGNOSIS — R5383 Other fatigue: Secondary | ICD-10-CM | POA: Insufficient documentation

## 2022-01-07 DIAGNOSIS — D508 Other iron deficiency anemias: Secondary | ICD-10-CM | POA: Diagnosis present

## 2022-01-07 DIAGNOSIS — Z808 Family history of malignant neoplasm of other organs or systems: Secondary | ICD-10-CM | POA: Diagnosis not present

## 2022-01-07 DIAGNOSIS — Z803 Family history of malignant neoplasm of breast: Secondary | ICD-10-CM | POA: Diagnosis not present

## 2022-01-07 DIAGNOSIS — E611 Iron deficiency: Secondary | ICD-10-CM
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
## 2022-01-07 NOTE — Progress Notes (Signed)
New patient evaluation.   

## 2022-01-07 NOTE — Assessment & Plan Note (Addendum)
#  Severe iron deficiency-mild anemia; secondary to bariatric surgery.  Poor tolerance/response to oral iron.  Recommend IV iron infusions. ? Discussed the potential acute infusion reactions with IV iron; which are quite rare.  Patient understands the risk; will proceed with infusions.  ? ?#Etiology of iron deficiency: sec to gastric by pass ? ?# Family History of breast cancer - sister with breast cancer in 69s [[place]]; son [75 or 50 years of age] Skin cancer [? Melanoma]; personal history of cervical cancer in 64s; mom- ?cervical cancer/? Frozen.  We will make a referral to genetics. ? ?# DISPOSITION: ?# genetics counseling referral re: family history of breast cancer ?# no labs today ?# venofer weekly x4 ?# follow up in 3 months- MD; labs- cbc/bmp; Iron studies; ferritin;b12 levels possible venofer- Dr.B ? ?Thank you Dr.Johnson for allowing me to participate in the care of your pleasant patient. Please do not hesitate to contact me with questions or concerns in the interim. ?

## 2022-01-08 NOTE — Telephone Encounter (Signed)
Requested medication (s) are due for refill today - unsure ? ?Requested medication (s) are on the active medication list -yes ? ?Future visit scheduled -yes ? ?Last refill: 02/11/21 #12 1RF ? ?Notes to clinic: Request RF: Requires provider review  ? ?Requested Prescriptions  ?Pending Prescriptions Disp Refills  ? Vitamin D, Ergocalciferol, (DRISDOL) 1.25 MG (50000 UNIT) CAPS capsule [Pharmacy Med Name: Vitamin D (Ergocalciferol) 50000 UNIT Oral Capsule] 4 capsule 0  ?  Sig: Take 1 capsule by mouth once a week  ?  ? Endocrinology:  Vitamins - Vitamin D Supplementation 2 Failed - 01/07/2022  2:55 PM  ?  ?  Failed - Manual Review: Route requests for 50,000 IU strength to the provider  ?  ?  Failed - Vitamin D in normal range and within 360 days  ?  Vit D, 1,25-Dihydroxy  ?Date Value Ref Range Status  ?04/06/2015 57.7 19.9 - 79.3 pg/mL Final  ? ?Vit D, 25-Hydroxy  ?Date Value Ref Range Status  ?08/13/2021 27.3 (L) 30.0 - 100.0 ng/mL Final  ?  Comment:  ?  Vitamin D deficiency has been defined by the Institute of ?Medicine and an Endocrine Society practice guideline as a ?level of serum 25-OH vitamin D less than 20 ng/mL (1,2). ?The Endocrine Society went on to further define vitamin D ?insufficiency as a level between 21 and 29 ng/mL (2). ?1. IOM (Institute of Medicine). 2010. Dietary reference ?   intakes for calcium and D. [place]: The ?   [employer]. ?2. Holick MF, Binkley [place], Bischoff-Ferrari HA, et al. ?   Evaluation, treatment, and prevention of vitamin D ?   deficiency: an Endocrine Society clinical practice ?   guideline. JCEM. 2011 Jul; 96(7):1911-30. ?  ?  ?  ?  ?  Passed - Ca in normal range and within 360 days  ?  Calcium  ?Date Value Ref Range Status  ?08/13/2021 9.7 8.7 - 10.2 mg/dL Final  ? ?Calcium, Total  ?Date Value Ref Range Status  ?10/10/2014 9.2 8.5 - 10.1 mg/dL Final  ? ?Calcium, Ion  ?Date Value Ref Range Status  ?07/12/2008 1.20  Final  ?  ?  ?  ?  Passed - Valid encounter  within last 12 months  ?  Recent Outpatient Visits   ? ?      ? 5 days ago Iron deficiency anemia, unspecified iron deficiency anemia type  ? [place], [place] P, DO  ? 2 weeks ago Anemia, unspecified type  ? [facility] Vigg, Avanti, MD  ? 3 months ago Migraine without aura and without status migrainosus, not intractable  ? [place], NP  ? 4 months ago Routine general medical examination at a health care facility  ? [place], DO  ? 8 months ago Upper respiratory tract infection, unspecified type  ? [facility], Lauren A, NP  ? ?  ?  ?Future Appointments   ? ?        ? In 2 months [name], Barb Merino, DO [facility]  ? ?  ? ?  ?  ?  ? ? ? ?Requested Prescriptions  ?Pending Prescriptions Disp Refills  ? Vitamin D, Ergocalciferol, (DRISDOL) 1.25 MG (50000 UNIT) CAPS capsule [Pharmacy Med Name: Vitamin D (Ergocalciferol) 50000 UNIT Oral Capsule] 4 capsule 0  ?  Sig: Take 1 capsule by mouth once a week  ?  ? Endocrinology:  Vitamins -  Vitamin D Supplementation 2 Failed - 01/07/2022  2:55 PM  ?  ?  Failed - Manual Review: Route requests for 50,000 IU strength to the provider  ?  ?  Failed - Vitamin D in normal range and within 360 days  ?  Vit D, 1,25-Dihydroxy  ?Date Value Ref Range Status  ?04/06/2015 57.7 19.9 - 79.3 pg/mL Final  ? ?Vit D, 25-Hydroxy  ?Date Value Ref Range Status  ?08/13/2021 27.3 (L) 30.0 - 100.0 ng/mL Final  ?  Comment:  ?  Vitamin D deficiency has been defined by the Institute of ?Medicine and an Endocrine Society practice guideline as a ?level of serum 25-OH vitamin D less than 20 ng/mL (1,2). ?The Endocrine Society went on to further define vitamin D ?insufficiency as a level between 21 and 29 ng/mL (2). ?1. IOM (Institute of Medicine). 2010. Dietary reference ?   intakes for calcium and D. [place]: The ?   [employer]. ?2. Holick MF, Binkley  [place], Bischoff-Ferrari HA, et al. ?   Evaluation, treatment, and prevention of vitamin D ?   deficiency: an Endocrine Society clinical practice ?   guideline. JCEM. 2011 Jul; 96(7):1911-30. ?  ?  ?  ?  ?  Passed - Ca in normal range and within 360 days  ?  Calcium  ?Date Value Ref Range Status  ?08/13/2021 9.7 8.7 - 10.2 mg/dL Final  ? ?Calcium, Total  ?Date Value Ref Range Status  ?10/10/2014 9.2 8.5 - 10.1 mg/dL Final  ? ?Calcium, Ion  ?Date Value Ref Range Status  ?07/12/2008 1.20  Final  ?  ?  ?  ?  Passed - Valid encounter within last 12 months  ?  Recent Outpatient Visits   ? ?      ? 5 days ago Iron deficiency anemia, unspecified iron deficiency anemia type  ? [place], [place] P, DO  ? 2 weeks ago Anemia, unspecified type  ? [facility] Vigg, Avanti, MD  ? 3 months ago Migraine without aura and without status migrainosus, not intractable  ? [employer], NP  ? 4 months ago Routine general medical examination at a health care facility  ? [place], DO  ? 8 months ago Upper respiratory tract infection, unspecified type  ? [facility], Lauren A, NP  ? ?  ?  ?Future Appointments   ? ?        ? In 2 months [name], Barb Merino, DO [facility]  ? ?  ? ?  ?  ?  ? ? ? ?

## 2022-01-09 LAB — FECAL OCCULT BLOOD, IMMUNOCHEMICAL: Fecal Occult Bld: NEGATIVE

## 2022-01-14 ENCOUNTER — Inpatient Hospital Stay: Payer: BLUE CROSS/BLUE SHIELD

## 2022-01-14 VITALS — BP 135/78 | HR 78 | Temp 99.3°F | Resp 18

## 2022-01-14 DIAGNOSIS — E611 Iron deficiency: Secondary | ICD-10-CM

## 2022-01-14 DIAGNOSIS — D508 Other iron deficiency anemias: Secondary | ICD-10-CM | POA: Diagnosis not present

## 2022-01-14 MED ORDER — SODIUM CHLORIDE 0.9 % IV SOLN: 200.0000 mg | Freq: Once | INTRAVENOUS | Status: DC

## 2022-01-14 MED ORDER — IRON SUCROSE 20 MG/ML IV SOLN
200.0000 mg | Freq: Once | INTRAVENOUS | Status: AC
  Administered 2022-01-14: 200 mg via INTRAVENOUS
  Filled 2022-01-14: qty 10

## 2022-01-14 MED ORDER — SODIUM CHLORIDE 0.9 % IV SOLN
Freq: Once | INTRAVENOUS | Status: AC
  Filled 2022-01-14: qty 250

## 2022-01-14 NOTE — Patient Instructions (Signed)
[facility]  Discharge Instructions: ?Thank you for choosing [facility] to provide your oncology and hematology care.  ?If you have a lab appointment with the Cancer Center, please go directly to the Cancer Center and check in at the registration area. ? ?Wear comfortable clothing and clothing appropriate for easy access to any Portacath or PICC line.  ? ?We strive to give you quality time with your provider. You may need to reschedule your appointment if you arrive late (15 or more minutes).  Arriving late affects you and other patients whose appointments are after yours.  Also, if you miss three or more appointments without notifying the office, you may be dismissed from the clinic at the provider?s discretion.    ?  ?For prescription refill requests, have your pharmacy contact our office and allow 72 hours for refills to be completed.   ? ?Today you received the following chemotherapy and/or immunotherapy agents VENOFER ?    ?  ?To help prevent nausea and vomiting after your treatment, we encourage you to take your nausea medication as directed. ? ?BELOW ARE SYMPTOMS THAT SHOULD BE REPORTED IMMEDIATELY: ?*FEVER GREATER THAN 100.4 F (38 ?C) OR HIGHER ?*CHILLS OR SWEATING ?*NAUSEA AND VOMITING THAT IS NOT CONTROLLED WITH YOUR NAUSEA MEDICATION ?*UNUSUAL SHORTNESS OF BREATH ?*UNUSUAL BRUISING OR BLEEDING ?*URINARY PROBLEMS (pain or burning when urinating, or frequent urination) ?*BOWEL PROBLEMS (unusual diarrhea, constipation, pain near the anus) ?TENDERNESS IN MOUTH AND THROAT WITH OR WITHOUT PRESENCE OF ULCERS (sore throat, sores in mouth, or a toothache) ?UNUSUAL RASH, SWELLING OR PAIN  ?UNUSUAL VAGINAL DISCHARGE OR ITCHING  ? ?Items with * indicate a potential emergency and should be followed up as soon as possible or go to the Emergency Department if any problems should occur. ? ?Please show the CHEMOTHERAPY ALERT CARD or IMMUNOTHERAPY ALERT CARD at check-in to  the Emergency Department and triage nurse. ? ?Should you have questions after your visit or need to cancel or reschedule your appointment, please contact [facility]  [phone] and follow the prompts.  Office hours are 8:00 a.m. to 4:30 p.m. Monday - Friday. Please note that voicemails left after 4:00 p.m. may not be returned until the following business day.  We are closed weekends and major holidays. You have access to a nurse at all times for urgent questions. Please call the main number to the clinic [phone] and follow the prompts. ? ?For any non-urgent questions, you may also contact your provider using MyChart. We now offer e-Visits for anyone 18 and older to request care online for non-urgent symptoms. For details visit [email or web address]. ?  ?Also download the MyChart app! Go to the app store, search "MyChart", open the app, select [facility], and log in with your MyChart username and password. ? ?Due to Covid, a mask is required upon entering the hospital/clinic. If you do not have a mask, one will be given to you upon arrival. For doctor visits, patients may have 1 support person aged 18 or older with them. For treatment visits, patients cannot have anyone with them due to current Covid guidelines and our immunocompromised population.  ? ?Iron Sucrose Injection ?What is this medication? ?IRON SUCROSE (EYE ern SOO krose) treats low levels of iron (iron deficiency anemia) in people with kidney disease. Iron is a mineral that plays an important role in making red blood cells, which carry oxygen from your lungs to the rest of your body. ?This medicine   may be used for other purposes; ask your health care provider or pharmacist if you have questions. ?COMMON BRAND NAME(S): Venofer ?What should I tell my care team before I take this medication? ?They need to know if you have any of these conditions: ?Anemia not caused by low iron levels ?Heart disease ?High levels of  iron in the blood ?Kidney disease ?Liver disease ?An unusual or allergic reaction to iron, other medications, foods, dyes, or preservatives ?Pregnant or trying to get pregnant ?Breast-feeding ?How should I use this medication? ?This medication is for infusion into a vein. It is given in a hospital or clinic setting. ?Talk to your care team about the use of this medication in children. While this medication may be prescribed for children as young as 2 years for selected conditions, precautions do apply. ?Overdosage: If you think you have taken too much of this medicine contact a poison control center or emergency room at once. ?NOTE: This medicine is only for you. Do not share this medicine with others. ?What if I miss a dose? ?It is important not to miss your dose. Call your care team if you are unable to keep an appointment. ?What may interact with this medication? ?Do not take this medication with any of the following: ?Deferoxamine ?Dimercaprol ?Other iron products ?This medication may also interact with the following: ?Chloramphenicol ?Deferasirox ?This list may not describe all possible interactions. Give your health care provider a list of all the medicines, herbs, non-prescription drugs, or dietary supplements you use. Also tell them if you smoke, drink alcohol, or use illegal drugs. Some items may interact with your medicine. ?What should I watch for while using this medication? ?Visit your care team regularly. Tell your care team if your symptoms do not start to get better or if they get worse. You may need blood work done while you are taking this medication. ?You may need to follow a special diet. Talk to your care team. Foods that contain iron include: whole grains/cereals, dried fruits, beans, or peas, leafy green vegetables, and organ meats (liver, kidney). ?What side effects may I notice from receiving this medication? ?Side effects that you should report to your care team as soon as  possible: ?Allergic reactions--skin rash, itching, hives, swelling of the face, lips, tongue, or throat ?Low blood pressure--dizziness, feeling faint or lightheaded, blurry vision ?Shortness of breath ?Side effects that usually do not require medical attention (report to your care team if they continue or are bothersome): ?Flushing ?Headache ?Joint pain ?Muscle pain ?Nausea ?Pain, redness, or irritation at injection site ?This list may not describe all possible side effects. Call your doctor for medical advice about side effects. You may report side effects to FDA at 1-800-FDA-1088. ?Where should I keep my medication? ?This medication is given in a hospital or clinic and will not be stored at home. ?NOTE: This sheet is a summary. It may not cover all possible information. If you have questions about this medicine, talk to your doctor, pharmacist, or health care provider. ?? 2023 Elsevier/Gold Standard (2021-02-09 00:00:00) ? ?

## 2022-01-21 ENCOUNTER — Inpatient Hospital Stay: Payer: BLUE CROSS/BLUE SHIELD

## 2022-01-21 ENCOUNTER — Other Ambulatory Visit: Payer: Self-pay | Admitting: Internal Medicine

## 2022-01-21 VITALS — BP 121/80 | HR 85 | Temp 96.7°F | Resp 18

## 2022-01-21 DIAGNOSIS — D508 Other iron deficiency anemias: Secondary | ICD-10-CM | POA: Diagnosis not present

## 2022-01-21 DIAGNOSIS — E611 Iron deficiency: Secondary | ICD-10-CM

## 2022-01-21 MED ORDER — ONDANSETRON HCL 4 MG/2ML IJ SOLN
4.0000 mg | Freq: Once | INTRAMUSCULAR | Status: AC
  Administered 2022-01-21: 4 mg via INTRAVENOUS
  Filled 2022-01-21: qty 2

## 2022-01-21 MED ORDER — SODIUM CHLORIDE 0.9 % IV SOLN
Freq: Once | INTRAVENOUS | Status: AC
  Filled 2022-01-21: qty 250

## 2022-01-21 MED ORDER — SODIUM CHLORIDE 0.9 % IV SOLN: 200.0000 mg | Freq: Once | INTRAVENOUS | Status: DC

## 2022-01-21 MED ORDER — IRON SUCROSE 20 MG/ML IV SOLN
200.0000 mg | Freq: Once | INTRAVENOUS | Status: AC
  Administered 2022-01-21: 200 mg via INTRAVENOUS
  Filled 2022-01-21: qty 10

## 2022-01-21 NOTE — Progress Notes (Signed)
Zofran ordered ?GB ?

## 2022-01-21 NOTE — Patient Instructions (Signed)
[facility] [place]  Discharge Instructions: ?Thank you for choosing [facility] to provide your oncology and hematology care.  ?If you have a lab appointment with the [facility], please go directly to the [facility] and check in at the registration area. ? ?Wear comfortable clothing and clothing appropriate for easy access to any Portacath or PICC line.  ? ?We strive to give you quality time with your provider. You may need to reschedule your appointment if you arrive late (15 or more minutes).  Arriving late affects you and other patients whose appointments are after yours.  Also, if you miss three or more appointments without notifying the office, you may be dismissed from the clinic at the provider?s discretion.    ?  ?For prescription refill requests, have your pharmacy contact our office and allow 72 hours for refills to be completed.   ? ?Today you received the following chemotherapy and/or immunotherapy agents VENOFEr    ?  ?To help prevent nausea and vomiting after your treatment, we encourage you to take your nausea medication as directed. ? ?BELOW ARE SYMPTOMS THAT SHOULD BE REPORTED IMMEDIATELY: ?*FEVER GREATER THAN 100.4 F (38 ?C) OR HIGHER ?*CHILLS OR SWEATING ?*NAUSEA AND VOMITING THAT IS NOT CONTROLLED WITH YOUR NAUSEA MEDICATION ?*UNUSUAL SHORTNESS OF BREATH ?*UNUSUAL BRUISING OR BLEEDING ?*URINARY PROBLEMS (pain or burning when urinating, or frequent urination) ?*BOWEL PROBLEMS (unusual diarrhea, constipation, pain near the anus) ?TENDERNESS IN MOUTH AND THROAT WITH OR WITHOUT PRESENCE OF ULCERS (sore throat, sores in mouth, or a toothache) ?UNUSUAL RASH, SWELLING OR PAIN  ?UNUSUAL VAGINAL DISCHARGE OR ITCHING  ? ?Items with * indicate a potential emergency and should be followed up as soon as possible or go to the Emergency Department if any problems should occur. ? ?Please show the CHEMOTHERAPY ALERT CARD or IMMUNOTHERAPY ALERT CARD at check-in to the  Emergency Department and triage nurse. ? ?Should you have questions after your visit or need to cancel or reschedule your appointment, please contact [facility] [place] [facility] [place]  [phone] and follow the prompts.  Office hours are 8:00 a.m. to 4:30 p.m. Monday - Friday. Please note that voicemails left after 4:00 p.m. may not be returned until the following business day.  We are closed weekends and major holidays. You have access to a nurse at all times for urgent questions. Please call the main number to the clinic [phone] and follow the prompts. ? ?For any non-urgent questions, you may also contact your provider using MyChart. We now offer e-Visits for anyone 34 and older to request care online for non-urgent symptoms. For details visit [email or web address]. ?  ?Also download the MyChart app! Go to the app store, search "MyChart", open the app, select [facility], and log in with your MyChart username and password. ? ?Due to Covid, a mask is required upon entering the hospital/clinic. If you do not have a mask, one will be given to you upon arrival. For doctor visits, patients may have 1 support person aged 47 or older with them. For treatment visits, patients cannot have anyone with them due to current Covid guidelines and our immunocompromised population.  ? ?Iron Sucrose Injection ?What is this medication? ?IRON SUCROSE (EYE ern SOO krose) treats low levels of iron (iron deficiency anemia) in people with kidney disease. Iron is a mineral that plays an important role in making red blood cells, which carry oxygen from your lungs to the rest of your body. ?This medicine may  be used for other purposes; ask your health care provider or pharmacist if you have questions. ?COMMON BRAND NAME(S): Venofer ?What should I tell my care team before I take this medication? ?They need to know if you have any of these conditions: ?Anemia not caused by low iron levels ?Heart disease ?High levels of  iron in the blood ?Kidney disease ?Liver disease ?An unusual or allergic reaction to iron, other medications, foods, dyes, or preservatives ?Pregnant or trying to get pregnant ?Breast-feeding ?How should I use this medication? ?This medication is for infusion into a vein. It is given in a hospital or clinic setting. ?Talk to your care team about the use of this medication in children. While this medication may be prescribed for children as young as 2 years for selected conditions, precautions do apply. ?Overdosage: If you think you have taken too much of this medicine contact a poison control center or emergency room at once. ?NOTE: This medicine is only for you. Do not share this medicine with others. ?What if I miss a dose? ?It is important not to miss your dose. Call your care team if you are unable to keep an appointment. ?What may interact with this medication? ?Do not take this medication with any of the following: ?Deferoxamine ?Dimercaprol ?Other iron products ?This medication may also interact with the following: ?Chloramphenicol ?Deferasirox ?This list may not describe all possible interactions. Give your health care provider a list of all the medicines, herbs, non-prescription drugs, or dietary supplements you use. Also tell them if you smoke, drink alcohol, or use illegal drugs. Some items may interact with your medicine. ?What should I watch for while using this medication? ?Visit your care team regularly. Tell your care team if your symptoms do not start to get better or if they get worse. You may need blood work done while you are taking this medication. ?You may need to follow a special diet. Talk to your care team. Foods that contain iron include: whole grains/cereals, dried fruits, beans, or peas, leafy green vegetables, and organ meats (liver, kidney). ?What side effects may I notice from receiving this medication? ?Side effects that you should report to your care team as soon as  possible: ?Allergic reactions--skin rash, itching, hives, swelling of the face, lips, tongue, or throat ?Low blood pressure--dizziness, feeling faint or lightheaded, blurry vision ?Shortness of breath ?Side effects that usually do not require medical attention (report to your care team if they continue or are bothersome): ?Flushing ?Headache ?Joint pain ?Muscle pain ?Nausea ?Pain, redness, or irritation at injection site ?This list may not describe all possible side effects. Call your doctor for medical advice about side effects. You may report side effects to FDA at 1-800-FDA-1088. ?Where should I keep my medication? ?This medication is given in a hospital or clinic and will not be stored at home. ?NOTE: This sheet is a summary. It may not cover all possible information. If you have questions about this medicine, talk to your doctor, pharmacist, or health care provider. ?? 2023 Elsevier/Gold Standard (2021-02-09 00:00:00) ? ?

## 2022-01-22 ENCOUNTER — Encounter: Payer: Self-pay | Admitting: Internal Medicine

## 2022-01-23 ENCOUNTER — Inpatient Hospital Stay: Payer: BLUE CROSS/BLUE SHIELD | Attending: Oncology | Admitting: Licensed Clinical Social Worker

## 2022-01-23 ENCOUNTER — Encounter: Payer: Self-pay | Admitting: Licensed Clinical Social Worker

## 2022-01-23 ENCOUNTER — Inpatient Hospital Stay: Payer: BLUE CROSS/BLUE SHIELD

## 2022-01-23 DIAGNOSIS — Z8541 Personal history of malignant neoplasm of cervix uteri: Secondary | ICD-10-CM

## 2022-01-23 DIAGNOSIS — Z8049 Family history of malignant neoplasm of other genital organs: Secondary | ICD-10-CM

## 2022-01-23 DIAGNOSIS — Z803 Family history of malignant neoplasm of breast: Secondary | ICD-10-CM

## 2022-01-23 DIAGNOSIS — Z808 Family history of malignant neoplasm of other organs or systems: Secondary | ICD-10-CM
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
## 2022-01-28 ENCOUNTER — Inpatient Hospital Stay: Payer: BLUE CROSS/BLUE SHIELD | Attending: Internal Medicine

## 2022-01-28 ENCOUNTER — Encounter: Payer: Self-pay | Admitting: Internal Medicine

## 2022-01-28 VITALS — BP 116/63 | HR 79 | Temp 98.1°F | Resp 16

## 2022-01-28 DIAGNOSIS — E611 Iron deficiency: Secondary | ICD-10-CM

## 2022-01-28 DIAGNOSIS — D508 Other iron deficiency anemias: Secondary | ICD-10-CM | POA: Diagnosis present

## 2022-01-28 MED ORDER — ONDANSETRON HCL 4 MG/2ML IJ SOLN
4.0000 mg | Freq: Once | INTRAMUSCULAR | Status: AC
  Administered 2022-01-28: 4 mg via INTRAVENOUS
  Filled 2022-01-28: qty 2

## 2022-01-28 MED ORDER — IRON SUCROSE 20 MG/ML IV SOLN
200.0000 mg | Freq: Once | INTRAVENOUS | Status: AC
  Administered 2022-01-28: 200 mg via INTRAVENOUS
  Filled 2022-01-28: qty 10

## 2022-01-28 MED ORDER — SODIUM CHLORIDE 0.9 % IV SOLN
Freq: Once | INTRAVENOUS | Status: AC
  Filled 2022-01-28: qty 250

## 2022-01-28 MED ORDER — SODIUM CHLORIDE 0.9 % IV SOLN: 200.0000 mg | Freq: Once | INTRAVENOUS | Status: DC

## 2022-02-04 ENCOUNTER — Inpatient Hospital Stay: Payer: BLUE CROSS/BLUE SHIELD

## 2022-02-04 VITALS — BP 124/85 | HR 75 | Resp 16

## 2022-02-04 DIAGNOSIS — E611 Iron deficiency: Secondary | ICD-10-CM

## 2022-02-04 DIAGNOSIS — D508 Other iron deficiency anemias: Secondary | ICD-10-CM | POA: Diagnosis not present

## 2022-02-04 MED ORDER — IRON SUCROSE 20 MG/ML IV SOLN
200.0000 mg | Freq: Once | INTRAVENOUS | Status: AC
  Administered 2022-02-04: 200 mg via INTRAVENOUS
  Filled 2022-02-04: qty 10

## 2022-02-04 MED ORDER — SODIUM CHLORIDE 0.9 % IV SOLN: 200.0000 mg | Freq: Once | INTRAVENOUS | Status: DC

## 2022-02-04 MED ORDER — ONDANSETRON HCL 4 MG/2ML IJ SOLN
4.0000 mg | Freq: Once | INTRAMUSCULAR | Status: AC
  Administered 2022-02-04: 4 mg via INTRAVENOUS
  Filled 2022-02-04: qty 2

## 2022-02-04 MED ORDER — SODIUM CHLORIDE 0.9 % IV SOLN
Freq: Once | INTRAVENOUS | Status: AC
  Filled 2022-02-04: qty 250

## 2022-02-05 ENCOUNTER — Ambulatory Visit: Payer: Self-pay | Admitting: Licensed Clinical Social Worker

## 2022-02-05 ENCOUNTER — Encounter: Payer: Self-pay | Admitting: Licensed Clinical Social Worker

## 2022-02-05 ENCOUNTER — Telehealth: Payer: Self-pay | Admitting: Licensed Clinical Social Worker

## 2022-02-05 DIAGNOSIS — Z1379 Encounter for other screening for genetic and chromosomal anomalies: Secondary | ICD-10-CM | POA: Insufficient documentation

## 2022-02-05 NOTE — Progress Notes (Signed)
HPI:  [name] was previously seen in the [facility] due to a family history of cancer and concerns regarding a hereditary predisposition to cancer. Please refer to our prior cancer genetics clinic note for more information regarding our discussion, assessment and recommendations, at the time. [name] recent genetic test results were disclosed to her, as were recommendations warranted by these results. These results and recommendations are discussed in more detail below.  CANCER HISTORY:  Oncology History   No history exists.    FAMILY HISTORY:  We obtained a detailed, 4-generation family history.  Significant diagnoses are listed below: Family History  Problem Relation Age of Onset   Heart disease Mother    Hypertension Mother    Migraines Mother    Diabetes Mother    Breast cancer Mother        dx 48s-40s   Migraines Father    Lupus Sister    Breast cancer Sister 15   Seizures Brother    Migraines Brother    Breast cancer Paternal Aunt    Stroke Maternal Grandmother    Diabetes Maternal Grandmother    Breast cancer Maternal Grandmother    COPD Maternal Grandfather    Heart disease Maternal Grandfather    Melanoma Maternal Grandfather    Diabetes Paternal Grandmother    Heart disease Paternal Grandmother    Stroke Paternal Grandmother    Breast cancer Paternal Grandmother    Dementia Paternal Grandfather    ADD / ADHD Son    Skin cancer Son     [name] had 3 sons, 1 passed at birth. Her living sons are 21 and 77, her oldest son has had skin cancer. [name] also has 1 full brother, 2 paternal half sisters and 3 paternal half brothers. One of her half sisters had breast cancer at 29 and is living at 5, unknown if she has had genetic testing.    [name] mother had breast cancer in her 35s-40s treated with lumpectomy, she is living at 54. Patient has 1 maternal aunt, 1 uncle. Her aunt possibly had breast cancer. Maternal grandmother had breast cancer  and passed at 51. Grandfather had history of melanoma, he passed at 61. His mother also had breast cancer.   [name] father died at 59 in a car accident. Patient has 3 paternal aunts. One aunt has metastatic breast cancer, another aunt has had cervical cancer. Paternal grandmother had breast cancer and passed in her 14s, grandfather passed at 42.   [name] is unaware of previous family history of genetic testing for hereditary cancer risks. Patient's ancestors are of Tina Harvey, Tina Harvey, Tina Harvey, Tina Harvey, Tina Harvey, Tina Harvey, Tina Harvey, Tina Harvey Tina Harvey and Tina Harvey descent.  There is reported Tina Harvey ancestry (7% on Ancestry DNA). There is no known consanguinity.     GENETIC TEST RESULTS: Genetic testing reported out on 02/01/2022 through the [facility] CancerNext-Expanded+RNA cancer panel found no pathogenic mutations.   The CancerNext-Expanded + RNAinsight gene panel offered by [employer] and includes sequencing and rearrangement analysis for the following 77 genes: IP, ALK, APC*, ATM*, AXIN2, BAP1, BARD1, BLM, BMPR1A, BRCA1*, BRCA2*, BRIP1*, CDC73, CDH1*,CDK4, CDKN1B, CDKN2A, CHEK2*, CTNNA1, DICER1, FANCC, FH, FLCN, GALNT12, KIF1B, LZTR1, MAX, MEN1, MET, MLH1*, MSH2*, MSH3, MSH6*, MUTYH*, NBN, NF1*, NF2, NTHL1, PALB2*, PHOX2B, PMS2*, POT1, PRKAR1A, PTCH1, PTEN*, RAD51C*, RAD51D*,RB1, RECQL, RET, SDHA, SDHAF2, SDHB, SDHC, SDHD, SMAD4, SMARCA4, SMARCB1, SMARCE1, STK11, SUFU, TMEM127, TP53*,TSC1, TSC2, VHL and XRCC2 (sequencing and deletion/duplication); EGFR, EGLN1, HOXB13, KIT, MITF,  PDGFRA, POLD1 and POLE (sequencing only); EPCAM and GREM1 (deletion/duplication only).   The test report has been scanned into EPIC and is located under the Molecular Pathology section of the Results Review tab.  A portion of the result report is included below for reference.    We discussed that because current genetic testing is not perfect, it is possible there may be a gene mutation in one of these genes that  current testing cannot detect, but that chance is small.  There could be another gene that has not yet been discovered, or that we have not yet tested, that is responsible for the cancer diagnoses in the family. It is also possible there is a hereditary cause for the cancer in the family that [name] did not inherit and therefore was not identified in her testing.  Therefore, it is important to remain in touch with cancer genetics in the future so that we can continue to offer [name] the most up to date genetic testing.   ADDITIONAL GENETIC TESTING: We discussed with [name] that her genetic testing was fairly extensive.  If there are genes identified to increase cancer risk that can be analyzed in the future, we would be happy to discuss and coordinate this testing at that time.    CANCER SCREENING RECOMMENDATIONS: [name] test result is considered negative (normal).  This means that we have not identified a hereditary cause for her family history of cancer at this time.   While reassuring, this does not definitively rule out a hereditary predisposition to cancer. It is still possible that there could be genetic mutations that are undetectable by current technology. There could be genetic mutations in genes that have not been tested or identified to increase cancer risk.  Therefore, it is recommended she continue to follow the cancer management and screening guidelines provided by her  primary healthcare provider.   An individual's cancer risk and medical management are not determined by genetic test results alone. Overall cancer risk assessment incorporates additional factors, including personal medical history, family history, and any available genetic information that may result in a personalized plan for cancer prevention and surveillance.  Based on [name] personal and family history of cancer as well as her genetic test results, risk model Rockne Menghini was used to estimate her risk of  developing breast cancer. This estimates her lifetime risk of developing breast cancer to be approximately 17.8%.  The patient's lifetime breast cancer risk is a preliminary estimate based on available information using one of several models endorsed by the [employer] (NCCN). The NCCN recommends consideration of breast MRI screening as an adjunct to mammography for patients at high risk (defined as 20% or greater lifetime risk).  This risk estimate can change over time and may be repeated to reflect new information in her personal or family history in the future.   RECOMMENDATIONS FOR FAMILY MEMBERS:  Relatives in this family might be at some increased risk of developing cancer, over the general population risk, simply due to the family history of cancer.  We recommended female relatives in this family have a yearly mammogram beginning at age 25, or 23 years younger than the earliest onset of cancer, an annual clinical breast exam, and perform monthly breast self-exams. Female relatives in this family should also have a gynecological exam as recommended by their primary provider.  All family members should be referred for colonoscopy starting at age 38.    It is  also possible there is a hereditary cause for the cancer in [name] family that she did not inherit and therefore was not identified in her.  Based on [name] family history, we recommended her sister and those who have had cancer in the family have genetic counseling and testing. [name] will let us know if we can be of any assistance in coordinating genetic counseling and/or testing for these family members.  FOLLOW-UP: Lastly, we discussed with [name] that cancer genetics is a rapidly advancing field and it is possible that new genetic tests will be appropriate for her and/or her family members in the future. We encouraged her to remain in contact with cancer genetics on an annual basis so we can update her personal  and family histories and let her know of advances in cancer genetics that may benefit this family.   Our contact number was provided. [name] questions were answered to her satisfaction, and she knows she is welcome to call us at anytime with additional questions or concerns.   Lacy Duverney, MS, [facility] Genetic Counselor [email or web address] Phone: [phone]

## 2022-02-05 NOTE — Telephone Encounter (Signed)
Revealed negative genetic testing.  This normal result is reassuring.  It is unlikely that there is an increased risk of another cancer due to a mutation in one of these genes.  However, genetic testing is not perfect, and cannot definitively rule out a hereditary cause.  It will be important for her to keep in contact with genetics to learn if any additional testing may be needed in the future.    ? ? ?

## 2022-03-20 ENCOUNTER — Encounter: Payer: Self-pay | Admitting: Internal Medicine

## 2022-04-01 ENCOUNTER — Encounter: Payer: Self-pay | Admitting: Family Medicine

## 2022-04-01 ENCOUNTER — Encounter: Payer: Self-pay | Admitting: Internal Medicine

## 2022-04-01 ENCOUNTER — Ambulatory Visit (INDEPENDENT_AMBULATORY_CARE_PROVIDER_SITE_OTHER): Payer: BLUE CROSS/BLUE SHIELD | Admitting: Family Medicine

## 2022-04-01 VITALS — BP 134/60 | HR 76 | Temp 98.2°F | Ht 65.98 in | Wt 190.8 lb

## 2022-04-01 DIAGNOSIS — E611 Iron deficiency: Secondary | ICD-10-CM | POA: Diagnosis not present

## 2022-04-01 DIAGNOSIS — F419 Anxiety disorder, unspecified: Secondary | ICD-10-CM

## 2022-04-01 DIAGNOSIS — R1310 Dysphagia, unspecified: Secondary | ICD-10-CM | POA: Diagnosis not present

## 2022-04-01 DIAGNOSIS — E782 Mixed hyperlipidemia: Secondary | ICD-10-CM

## 2022-04-01 DIAGNOSIS — E559 Vitamin D deficiency, unspecified: Secondary | ICD-10-CM | POA: Diagnosis not present

## 2022-04-01 DIAGNOSIS — G43009 Migraine without aura, not intractable, without status migrainosus: Secondary | ICD-10-CM | POA: Diagnosis not present

## 2022-04-01 DIAGNOSIS — M109 Gout, unspecified: Secondary | ICD-10-CM

## 2022-04-01 DIAGNOSIS — I10 Essential (primary) hypertension: Secondary | ICD-10-CM

## 2022-04-01 DIAGNOSIS — K219 Gastro-esophageal reflux disease without esophagitis: Secondary | ICD-10-CM

## 2022-04-01 DIAGNOSIS — E538 Deficiency of other specified B group vitamins: Secondary | ICD-10-CM

## 2022-04-01 DIAGNOSIS — Z8639 Personal history of other endocrine, nutritional and metabolic disease: Secondary | ICD-10-CM

## 2022-04-01 MED ORDER — LISINOPRIL-HYDROCHLOROTHIAZIDE 20-25 MG PO TABS
1.0000 | ORAL_TABLET | Freq: Every day | ORAL | 1 refills | Status: DC
Start: 2022-04-01 — End: 2022-10-03

## 2022-04-01 MED ORDER — ATORVASTATIN CALCIUM 10 MG PO TABS: 10.0000 mg | ORAL_TABLET | Freq: Every day | ORAL | 1 refills | Status: DC

## 2022-04-01 MED ORDER — ESCITALOPRAM OXALATE 10 MG PO TABS: 10.0000 mg | ORAL_TABLET | Freq: Every day | ORAL | 1 refills | Status: DC

## 2022-04-01 MED ORDER — BUSPIRONE HCL 5 MG PO TABS: 5.0000 mg | ORAL_TABLET | Freq: Three times a day (TID) | ORAL | 6 refills | Status: DC

## 2022-04-01 MED ORDER — PANTOPRAZOLE SODIUM 40 MG PO TBEC: 40.0000 mg | DELAYED_RELEASE_TABLET | Freq: Every day | ORAL | 1 refills | Status: DC

## 2022-04-01 MED ORDER — KETOROLAC TROMETHAMINE 60 MG/2ML IM SOLN
60.0000 mg | Freq: Once | INTRAMUSCULAR | Status: AC
  Administered 2022-04-01: 60 mg via INTRAMUSCULAR

## 2022-04-01 MED ORDER — CYANOCOBALAMIN 1000 MCG/ML IJ SOLN: 1000.0000 ug | INTRAMUSCULAR | 5 refills | Status: DC

## 2022-04-01 NOTE — Assessment & Plan Note (Signed)
Under good control on current regimen. Continue current regimen. Continue to monitor. Call with any concerns. Refills given. Labs drawn today.   

## 2022-04-01 NOTE — Progress Notes (Signed)
BP 134/60   Pulse 76   Temp 98.2 F (36.8 C) (Oral)   Ht 5' 5.98" (1.676 m)   Wt 190 lb 12.8 oz (86.5 kg)   SpO2 100%   BMI 30.81 kg/m    Subjective:    Patient ID: Tina Harvey, female    DOB: November 20, 1970, 51 y.o.   MRN: [id number]  HPI: Tina Harvey is a 51 y.o. female  Chief Complaint  Patient presents with   Hypertension   Iron Deficiency Anemia   Dysphagia    Feels like she is having a hard time swallowing and feels like she needs to drink to make food go down.    DYSPHAGIA Duration: about a month Description of symptom: having trouble swallowing Onset: Immediately upon swallowing Location of dysphagia: throat Dysphagia to solids only: no Dysphagia to solids & liquids: yes  Frequency:intermittent  Progressively getting worse: yes Alleviatiating factors: nothing Provoking factors: unknown Status: stable EGD: yes Weight loss: no Sensation of lump in throat: no Heartburn: no Odynophagia: no Nausea: yes Vomiting: no Drooling/nasal regurgitation/food spillage: no Coughing/choking/dysphonia: no Dysarthria: no Hematemesis: no Regurgitation of undigested food/halitosis: yes Chest pain: no  HYPERTENSION / HYPERLIPIDEMIA Satisfied with current treatment? yes Duration of hypertension: chronic BP monitoring frequency: not checking BP medication side effects: no Past BP meds: lisinopril-HCTZ Duration of hyperlipidemia: chronic Cholesterol medication side effects: no Cholesterol supplements: none Past cholesterol medications: atorvastatin Medication compliance: excellent compliance Aspirin: no Recent stressors: no Recurrent headaches: no Visual changes: no Palpitations: no Dyspnea: no Chest pain: no Lower extremity edema: no Dizzy/lightheaded: no  DEPRESSION Mood status: controlled Satisfied with current treatment?: yes Symptom severity: mild  Duration of current treatment : chronic Side effects: no Medication compliance: excellent  compliance Psychotherapy/counseling: yes  Previous psychiatric medications: lexapro Depressed mood: no Anxious mood: no Anhedonia: no Significant weight loss or gain: no Insomnia: no  Fatigue: yes Feelings of worthlessness or guilt: no Impaired concentration/indecisiveness: no Suicidal ideations: no Hopelessness: no Crying spells: no    04/01/2022    8:54 AM 12/20/2021    3:05 PM 09/18/2021    9:52 AM 08/13/2021    2:36 PM 02/07/2021    8:13 AM  Depression screen PHQ 2/9  Decreased Interest 0 0 0 0 0  Down, Depressed, Hopeless 0 0 0 0 0  PHQ - 2 Score 0 0 0 0 0  Altered sleeping 0 2 0 3 0  Tired, decreased energy 0 3 0 2 0  Change in appetite 0 3 0 0 0  Feeling bad or failure about yourself  0 0 0 0 0  Trouble concentrating 0 2 0 0 0  Moving slowly or fidgety/restless 0 0 0 0 0  Suicidal thoughts 0 0 0 0 0  PHQ-9 Score 0 10 0 5 0  Difficult doing work/chores Not difficult at all  Not difficult at all  Not difficult at all   Migraines have been bad. Having issues getting her emgality. Has a terrible headache today. Following with neurology.  Relevant past medical, surgical, family and social history reviewed and updated as indicated. Interim medical history since our last visit reviewed. Allergies and medications reviewed and updated.  Review of Systems  Constitutional: Negative.   Respiratory: Negative.    Cardiovascular: Negative.   Gastrointestinal: Negative.   Genitourinary: Negative.   Musculoskeletal: Negative.   Neurological:  Positive for headaches. Negative for dizziness, tremors, seizures, syncope, facial asymmetry, speech difficulty, weakness, light-headedness and numbness.  Hematological: Negative.  Psychiatric/Behavioral: Negative.      Per HPI unless specifically indicated above     Objective:    BP 134/60   Pulse 76   Temp 98.2 F (36.8 C) (Oral)   Ht 5' 5.98" (1.676 m)   Wt 190 lb 12.8 oz (86.5 kg)   SpO2 100%   BMI 30.81 kg/m   Wt  Readings from Last 3 Encounters:  04/01/22 190 lb 12.8 oz (86.5 kg)  01/07/22 189 lb 6.4 oz (85.9 kg)  01/03/22 189 lb (85.7 kg)    Physical Exam Vitals and nursing note reviewed.  Constitutional:      General: She is not in acute distress.    Appearance: Normal appearance. She is not ill-appearing, toxic-appearing or diaphoretic.  HENT:     Head: Normocephalic and atraumatic.     Right Ear: External ear normal.     Left Ear: External ear normal.     Nose: Nose normal.     Mouth/Throat:     Mouth: Mucous membranes are moist.     Pharynx: Oropharynx is clear.  Eyes:     General: No scleral icterus.       Right eye: No discharge.        Left eye: No discharge.     Extraocular Movements: Extraocular movements intact.     Conjunctiva/sclera: Conjunctivae normal.     Pupils: Pupils are equal, round, and reactive to light.  Cardiovascular:     Rate and Rhythm: Normal rate and regular rhythm.     Pulses: Normal pulses.     Heart sounds: Normal heart sounds. No murmur heard.    No friction rub. No gallop.  Pulmonary:     Effort: Pulmonary effort is normal. No respiratory distress.     Breath sounds: Normal breath sounds. No stridor. No wheezing, rhonchi or rales.  Chest:     Chest wall: No tenderness.  Musculoskeletal:        General: Normal range of motion.     Cervical back: Normal range of motion and neck supple.  Skin:    General: Skin is warm and dry.     Capillary Refill: Capillary refill takes less than 2 seconds.     Coloration: Skin is not jaundiced or pale.     Findings: No bruising, erythema, lesion or rash.  Neurological:     General: No focal deficit present.     Mental Status: She is alert and oriented to person, place, and time. Mental status is at baseline.  Psychiatric:        Mood and Affect: Mood normal.        Behavior: Behavior normal.        Thought Content: Thought content normal.        Judgment: Judgment normal.     Results for orders placed or  performed in visit on 01/03/22  Fecal occult blood, imunochemical([facility])   Specimen: Stool   ST  Result Value Ref Range   Fecal Occult Bld Negative Negative      Assessment & Plan:   Problem List Items Addressed This Visit       Cardiovascular and Mediastinum   Migraine without aura and without status migrainosus, not intractable    Acute migraine today. Will give toradol shot. Call with any concerns. Continue to follow with neurology.       Relevant Medications   baclofen (LIORESAL) 10 MG tablet   tiZANidine (ZANAFLEX) 2 MG tablet   lisinopril-hydrochlorothiazide (ZESTORETIC) 20-25 MG tablet  escitalopram (LEXAPRO) 10 MG tablet   atorvastatin (LIPITOR) 10 MG tablet   Other Relevant Orders   Comprehensive metabolic panel   Benign essential hypertension    Under good control on current regimen. Continue current regimen. Continue to monitor. Call with any concerns. Refills given. Labs drawn today.        Relevant Medications   lisinopril-hydrochlorothiazide (ZESTORETIC) 20-25 MG tablet   atorvastatin (LIPITOR) 10 MG tablet   Other Relevant Orders   Comprehensive metabolic panel     Digestive   Gastroesophageal reflux disease    Under good control on current regimen. Continue current regimen. Continue to monitor. Call with any concerns. Refills given. Labs drawn today.        Relevant Medications   pantoprazole (PROTONIX) 40 MG tablet     Other   Hyperlipidemia    Under good control on current regimen. Continue current regimen. Continue to monitor. Call with any concerns. Refills given. Labs drawn today.        Relevant Medications   lisinopril-hydrochlorothiazide (ZESTORETIC) 20-25 MG tablet   atorvastatin (LIPITOR) 10 MG tablet   Other Relevant Orders   Comprehensive metabolic panel   Lipid Panel w/o Chol/HDL Ratio   Gout    Rechecking labs today. Await results. Treat as needed.       Relevant Orders   Comprehensive metabolic panel   Uric  acid   Vitamin D deficiency    Rechecking labs today. Await results. Treat as needed.       Relevant Orders   Comprehensive metabolic panel   VITAMIN D 25 Hydroxy (Vit-D Deficiency, Fractures)   B12 deficiency    Rechecking labs today. Await results. Treat as needed.       Relevant Orders   Comprehensive metabolic panel   N17   Anxiety    Under good control on current regimen. Continue current regimen. Continue to monitor. Call with any concerns. Refills given.        Relevant Medications   escitalopram (LEXAPRO) 10 MG tablet   busPIRone (BUSPAR) 5 MG tablet   Other Relevant Orders   Comprehensive metabolic panel   H/O diabetes mellitus    Rechecking labs today. Await results. Treat as needed.       Relevant Orders   Hgb A1c w/o eAG   Iron deficiency    Rechecking labs today. Await results. Treat as needed.       Relevant Orders   Comprehensive metabolic panel   CBC with Differential/Platelet   Ferritin   Iron Binding Cap (TIBC)([facility])   Other Visit Diagnoses     Dysphagia, unspecified type    -  Primary   Will get her back into GI. Referral generated today. Await their input.    Relevant Orders   Ambulatory referral to Gastroenterology        Follow up plan: Return in about 6 months (around 10/02/2022) for physical.

## 2022-04-01 NOTE — Assessment & Plan Note (Signed)
Rechecking labs today. Await results. Treat as needed.  °

## 2022-04-01 NOTE — Assessment & Plan Note (Signed)
Acute migraine today. Will give toradol shot. Call with any concerns. Continue to follow with neurology.

## 2022-04-01 NOTE — Assessment & Plan Note (Signed)
Under good control on current regimen. Continue current regimen. Continue to monitor. Call with any concerns. Refills given.   

## 2022-04-02 LAB — COMPREHENSIVE METABOLIC PANEL
ALT: 16 IU/L (ref 0–32)
AST: 18 IU/L (ref 0–40)
Albumin/Globulin Ratio: 1.6 (ref 1.2–2.2)
Albumin: 4.7 g/dL (ref 3.8–4.8)
Alkaline Phosphatase: 92 IU/L (ref 44–121)
BUN/Creatinine Ratio: 15 (ref 9–23)
BUN: 14 mg/dL (ref 6–24)
Bilirubin Total: 0.3 mg/dL (ref 0.0–1.2)
CO2: 28 mmol/L (ref 20–29)
Calcium: 9.9 mg/dL (ref 8.7–10.2)
Chloride: 99 mmol/L (ref 96–106)
Creatinine, Ser: 0.91 mg/dL (ref 0.57–1.00)
Globulin, Total: 2.9 g/dL (ref 1.5–4.5)
Glucose: 90 mg/dL (ref 70–99)
Potassium: 3.6 mmol/L (ref 3.5–5.2)
Sodium: 143 mmol/L (ref 134–144)
Total Protein: 7.6 g/dL (ref 6.0–8.5)
eGFR: 77 mL/min/{1.73_m2} (ref >59)

## 2022-04-02 LAB — CBC WITH DIFFERENTIAL/PLATELET
Basophils Absolute: 0 10*3/uL (ref 0.0–0.2)
Basos: 1 %
EOS (ABSOLUTE): 0.3 10*3/uL (ref 0.0–0.4)
Eos: 4 %
Hematocrit: 40 % (ref 34.0–46.6)
Hemoglobin: 13 g/dL (ref 11.1–15.9)
Immature Grans (Abs): 0 10*3/uL (ref 0.0–0.1)
Immature Granulocytes: 0 %
Lymphocytes Absolute: 2.5 10*3/uL (ref 0.7–3.1)
Lymphs: 34 %
MCH: 28.3 pg (ref 26.6–33.0)
MCHC: 32.5 g/dL (ref 31.5–35.7)
MCV: 87 fL (ref 79–97)
Monocytes Absolute: 0.6 10*3/uL (ref 0.1–0.9)
Monocytes: 9 %
Neutrophils Absolute: 3.8 10*3/uL (ref 1.4–7.0)
Neutrophils: 52 %
Platelets: 266 10*3/uL (ref 150–450)
RBC: 4.6 x10E6/uL (ref 3.77–5.28)
RDW: 14.8 % (ref 11.7–15.4)
WBC: 7.2 10*3/uL (ref 3.4–10.8)

## 2022-04-02 LAB — HGB A1C W/O EAG: Hgb A1c MFr Bld: 5.6 % (ref 4.8–5.6)

## 2022-04-02 LAB — LIPID PANEL W/O CHOL/HDL RATIO
Cholesterol, Total: 182 mg/dL (ref 100–199)
HDL: 49 mg/dL (ref >39)
LDL Chol Calc (NIH): 104 mg/dL — ABNORMAL HIGH (ref 0–99)
Triglycerides: 166 mg/dL — ABNORMAL HIGH (ref 0–149)
VLDL Cholesterol Cal: 29 mg/dL (ref 5–40)

## 2022-04-02 LAB — VITAMIN B12: Vitamin B-12: 829 pg/mL (ref 232–1245)

## 2022-04-02 LAB — IRON AND TIBC
Iron Saturation: 23 % (ref 15–55)
Iron: 81 ug/dL (ref 27–159)
Total Iron Binding Capacity: 360 ug/dL (ref 250–450)
UIBC: 279 ug/dL (ref 131–425)

## 2022-04-02 LAB — VITAMIN D 25 HYDROXY (VIT D DEFICIENCY, FRACTURES): Vit D, 25-Hydroxy: 27.5 ng/mL — ABNORMAL LOW (ref 30.0–100.0)

## 2022-04-02 LAB — FERRITIN: Ferritin: 104 ng/mL (ref 15–150)

## 2022-04-02 LAB — URIC ACID: Uric Acid: 5 mg/dL (ref 2.6–6.2)

## 2022-04-08 ENCOUNTER — Inpatient Hospital Stay: Payer: BLUE CROSS/BLUE SHIELD | Attending: Internal Medicine

## 2022-04-08 DIAGNOSIS — E611 Iron deficiency: Secondary | ICD-10-CM

## 2022-04-08 DIAGNOSIS — M8580 Other specified disorders of bone density and structure, unspecified site: Secondary | ICD-10-CM | POA: Insufficient documentation

## 2022-04-08 DIAGNOSIS — D508 Other iron deficiency anemias: Secondary | ICD-10-CM | POA: Diagnosis not present

## 2022-04-08 DIAGNOSIS — Z8541 Personal history of malignant neoplasm of cervix uteri: Secondary | ICD-10-CM | POA: Insufficient documentation

## 2022-04-08 LAB — CBC WITH DIFFERENTIAL/PLATELET
Abs Immature Granulocytes: 0.04 10*3/uL (ref 0.00–0.07)
Basophils Absolute: 0 10*3/uL (ref 0.0–0.1)
Basophils Relative: 0 %
Eosinophils Absolute: 0.3 10*3/uL (ref 0.0–0.5)
Eosinophils Relative: 3 %
HCT: 38.4 % (ref 36.0–46.0)
Hemoglobin: 12.7 g/dL (ref 12.0–15.0)
Immature Granulocytes: 0 %
Lymphocytes Relative: 32 %
Lymphs Abs: 2.9 10*3/uL (ref 0.7–4.0)
MCH: 28.4 pg (ref 26.0–34.0)
MCHC: 33.1 g/dL (ref 30.0–36.0)
MCV: 85.9 fL (ref 80.0–100.0)
Monocytes Absolute: 0.8 10*3/uL (ref 0.1–1.0)
Monocytes Relative: 9 %
Neutro Abs: 4.9 10*3/uL (ref 1.7–7.7)
Neutrophils Relative %: 56 %
Platelets: 270 10*3/uL (ref 150–400)
RBC: 4.47 MIL/uL (ref 3.87–5.11)
RDW: 14.4 % (ref 11.5–15.5)
WBC: 9 10*3/uL (ref 4.0–10.5)
nRBC: 0 % (ref 0.0–0.2)

## 2022-04-08 LAB — BASIC METABOLIC PANEL
Anion gap: 9 (ref 5–15)
BUN: 13 mg/dL (ref 6–20)
CO2: 29 mmol/L (ref 22–32)
Calcium: 9.3 mg/dL (ref 8.9–10.3)
Chloride: 98 mmol/L (ref 98–111)
Creatinine, Ser: 0.95 mg/dL (ref 0.44–1.00)
GFR, Estimated: >60 mL/min (ref >60)
Glucose, Bld: 82 mg/dL (ref 70–99)
Potassium: 3 mmol/L — ABNORMAL LOW (ref 3.5–5.1)
Sodium: 136 mmol/L (ref 135–145)

## 2022-04-08 LAB — FERRITIN: Ferritin: 62 ng/mL (ref 11–307)

## 2022-04-08 LAB — IRON AND TIBC
Iron: 90 ug/dL (ref 28–170)
Saturation Ratios: 22 % (ref 10.4–31.8)
TIBC: 405 ug/dL (ref 250–450)
UIBC: 315 ug/dL

## 2022-04-08 LAB — VITAMIN B12: Vitamin B-12: 818 pg/mL (ref 180–914)

## 2022-04-09 ENCOUNTER — Inpatient Hospital Stay (HOSPITAL_BASED_OUTPATIENT_CLINIC_OR_DEPARTMENT_OTHER): Payer: BLUE CROSS/BLUE SHIELD | Admitting: Internal Medicine

## 2022-04-09 ENCOUNTER — Encounter: Payer: Self-pay | Admitting: Internal Medicine

## 2022-04-09 ENCOUNTER — Inpatient Hospital Stay: Payer: BLUE CROSS/BLUE SHIELD

## 2022-04-09 DIAGNOSIS — D508 Other iron deficiency anemias: Secondary | ICD-10-CM | POA: Diagnosis not present

## 2022-04-09 DIAGNOSIS — E611 Iron deficiency: Secondary | ICD-10-CM

## 2022-04-09 MED ORDER — ONDANSETRON HCL 4 MG/2ML IJ SOLN
4.0000 mg | Freq: Once | INTRAMUSCULAR | Status: AC
  Administered 2022-04-09: 4 mg via INTRAVENOUS
  Filled 2022-04-09: qty 2

## 2022-04-09 MED ORDER — SODIUM CHLORIDE 0.9 % IV SOLN
Freq: Once | INTRAVENOUS | Status: AC
  Filled 2022-04-09: qty 250

## 2022-04-09 MED ORDER — SODIUM CHLORIDE 0.9 % IV SOLN
200.0000 mg | Freq: Once | INTRAVENOUS | Status: AC
  Administered 2022-04-09: 200 mg via INTRAVENOUS
  Filled 2022-04-09: qty 10

## 2022-04-09 NOTE — Patient Instructions (Signed)
[facility]  Discharge Instructions: Thank you for choosing [facility] to provide your oncology and hematology care.  If you have a lab appointment with the Cancer Center, please go directly to the Cancer Center and check in at the registration area.  Wear comfortable clothing and clothing appropriate for easy access to any Portacath or PICC line.   We strive to give you quality time with your provider. You may need to reschedule your appointment if you arrive late (15 or more minutes).  Arriving late affects you and other patients whose appointments are after yours.  Also, if you miss three or more appointments without notifying the office, you may be dismissed from the clinic at the provider's discretion.      For prescription refill requests, have your pharmacy contact our office and allow 72 hours for refills to be completed.    Today you received the following chemotherapy and/or immunotherapy agents VENOFER      To help prevent nausea and vomiting after your treatment, we encourage you to take your nausea medication as directed.  BELOW ARE SYMPTOMS THAT SHOULD BE REPORTED IMMEDIATELY: *FEVER GREATER THAN 100.4 F (38 C) OR HIGHER *CHILLS OR SWEATING *NAUSEA AND VOMITING THAT IS NOT CONTROLLED WITH YOUR NAUSEA MEDICATION *UNUSUAL SHORTNESS OF BREATH *UNUSUAL BRUISING OR BLEEDING *URINARY PROBLEMS (pain or burning when urinating, or frequent urination) *BOWEL PROBLEMS (unusual diarrhea, constipation, pain near the anus) TENDERNESS IN MOUTH AND THROAT WITH OR WITHOUT PRESENCE OF ULCERS (sore throat, sores in mouth, or a toothache) UNUSUAL RASH, SWELLING OR PAIN  UNUSUAL VAGINAL DISCHARGE OR ITCHING   Items with * indicate a potential emergency and should be followed up as soon as possible or go to the Emergency Department if any problems should occur.  Please show the CHEMOTHERAPY ALERT CARD or IMMUNOTHERAPY ALERT CARD at check-in to the  Emergency Department and triage nurse.  Should you have questions after your visit or need to cancel or reschedule your appointment, please contact [facility]  [phone] and follow the prompts.  Office hours are 8:00 a.m. to 4:30 p.m. Monday - Friday. Please note that voicemails left after 4:00 p.m. may not be returned until the following business day.  We are closed weekends and major holidays. You have access to a nurse at all times for urgent questions. Please call the main number to the clinic [phone] and follow the prompts.  For any non-urgent questions, you may also contact your provider using MyChart. We now offer e-Visits for anyone 18 and older to request care online for non-urgent symptoms. For details visit [email or web address].   Also download the MyChart app! Go to the app store, search "MyChart", open the app, select [facility], and log in with your MyChart username and password.  Masks are optional in the cancer centers. If you would like for your care team to wear a mask while they are taking care of you, please let them know. For doctor visits, patients may have with them one support person who is at least 50 years old. At this time, visitors are not allowed in the infusion area.   Iron Sucrose Injection What is this medication? IRON SUCROSE (EYE ern SOO krose) treats low levels of iron (iron deficiency anemia) in people with kidney disease. Iron is a mineral that plays an important role in making red blood cells, which carry oxygen from your lungs to the rest of your body. This medicine may   be used for other purposes; ask your health care provider or pharmacist if you have questions. COMMON BRAND NAME(S): Venofer What should I tell my care team before I take this medication? They need to know if you have any of these conditions: Anemia not caused by low iron levels Heart disease High levels of iron in the blood Kidney disease Liver  disease An unusual or allergic reaction to iron, other medications, foods, dyes, or preservatives Pregnant or trying to get pregnant Breast-feeding How should I use this medication? This medication is for infusion into a vein. It is given in a hospital or clinic setting. Talk to your care team about the use of this medication in children. While this medication may be prescribed for children as young as 2 years for selected conditions, precautions do apply. Overdosage: If you think you have taken too much of this medicine contact a poison control center or emergency room at once. NOTE: This medicine is only for you. Do not share this medicine with others. What if I miss a dose? It is important not to miss your dose. Call your care team if you are unable to keep an appointment. What may interact with this medication? Do not take this medication with any of the following: Deferoxamine Dimercaprol Other iron products This medication may also interact with the following: Chloramphenicol Deferasirox This list may not describe all possible interactions. Give your health care provider a list of all the medicines, herbs, non-prescription drugs, or dietary supplements you use. Also tell them if you smoke, drink alcohol, or use illegal drugs. Some items may interact with your medicine. What should I watch for while using this medication? Visit your care team regularly. Tell your care team if your symptoms do not start to get better or if they get worse. You may need blood work done while you are taking this medication. You may need to follow a special diet. Talk to your care team. Foods that contain iron include: whole grains/cereals, dried fruits, beans, or peas, leafy green vegetables, and organ meats (liver, kidney). What side effects may I notice from receiving this medication? Side effects that you should report to your care team as soon as possible: Allergic reactions--skin rash, itching, hives,  swelling of the face, lips, tongue, or throat Low blood pressure--dizziness, feeling faint or lightheaded, blurry vision Shortness of breath Side effects that usually do not require medical attention (report to your care team if they continue or are bothersome): Flushing Headache Joint pain Muscle pain Nausea Pain, redness, or irritation at injection site This list may not describe all possible side effects. Call your doctor for medical advice about side effects. You may report side effects to FDA at 1-800-FDA-1088. Where should I keep my medication? This medication is given in a hospital or clinic and will not be stored at home. NOTE: This sheet is a summary. It may not cover all possible information. If you have questions about this medicine, talk to your doctor, pharmacist, or health care provider.  2023 Elsevier/Gold Standard (2021-02-09 00:00:00)   

## 2022-04-09 NOTE — Progress Notes (Signed)
Pt in for follow up, reports being very fatigued.

## 2022-04-09 NOTE — Assessment & Plan Note (Addendum)
#  Severe iron deficiency-mild anemia; secondary to bariatric surgery.  Poor tolerance/response to oral iron.   #Hemoglobin improved.  July 2023 iron saturation 23% ferritin 60. Proceed with IV iron infusion given the malabsorption.   # Family History of breast cancer - sister with breast cancer in 58s [[place]]; son [40 or 50 years of age] Skin cancer [? Melanoma]; personal history of cervical cancer in 8s; mom- ?cervical cancer/? Frozen.  Status post -genetic counseling evaluation NEGATIVE.   # DISPOSITION: # venofer today # follow up in 4 months- MD; labs- cbc/bmp; Iron studies; ferritin;b12 levels possible venofer- Dr.B
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
## 2022-04-11 ENCOUNTER — Encounter: Payer: Self-pay | Admitting: Internal Medicine

## 2022-05-10 ENCOUNTER — Ambulatory Visit: Payer: Self-pay | Admitting: *Deleted

## 2022-05-10 ENCOUNTER — Telehealth: Payer: Self-pay | Admitting: Family Medicine

## 2022-05-10 DIAGNOSIS — E611 Iron deficiency: Secondary | ICD-10-CM

## 2022-05-10 DIAGNOSIS — R1310 Dysphagia, unspecified: Secondary | ICD-10-CM

## 2022-05-10 NOTE — Telephone Encounter (Signed)
Patient has MRI scheduled 05/14/22 and is requesting medication- neurologist that ordered test is closed today and patient is unable to send request to them.

## 2022-05-10 NOTE — Telephone Encounter (Signed)
Routing to provider to advise.  

## 2022-05-10 NOTE — Telephone Encounter (Signed)
Copied from [place] [phone]. Topic: Referral - Question >> May 10, 2022  1:34 PM Tiffany B wrote: Patient insurance has change to [facility] and she would like to be refereed to gastro for an endoscopy in [place]. Patient unsure if she should request this from her bariatric physician or PCP.

## 2022-05-10 NOTE — Telephone Encounter (Signed)
Summary: claustrophobic   Patient is having a MRI on 05/14/2022 and she is claustrophobic and would like PCP to prescribe her a medication to help her relax.     [place], [place]  Phone: [phone]  Fax: [phone]      Reason for Disposition . Prescription request for new medicine (not a refill)  Answer Assessment - Initial Assessment Questions 1. NAME of MEDICINE: "What medicine(s) are you calling about?"     Patient requesting new Rx- short term- anti anxiety 2. QUESTION: "What is your question?" (e.g., double dose of medicine, side effect)     Patient is having MRI and request medication to help her relax 3. PRESCRIBER: "Who prescribed the medicine?" Reason: if prescribed by specialist, call should be referred to that group.     Neurologist ordered test- but office is not open today 4. SYMPTOMS: "Do you have any symptoms?" If Yes, ask: "What symptoms are you having?"  "How bad are the symptoms (e.g., mild, moderate, severe)     Claustrophobic- having MRI  Protocols used: Medication Question Call-A-AH

## 2022-05-13 ENCOUNTER — Other Ambulatory Visit: Payer: Self-pay | Admitting: Student

## 2022-05-13 DIAGNOSIS — G43011 Migraine without aura, intractable, with status migrainosus: Secondary | ICD-10-CM

## 2022-05-13 DIAGNOSIS — Z87820 Personal history of traumatic brain injury: Secondary | ICD-10-CM

## 2022-05-13 MED ORDER — ALPRAZOLAM 0.5 MG PO TABS: ORAL_TABLET | ORAL | 0 refills | Status: DC

## 2022-05-13 NOTE — Addendum Note (Signed)
Addended by: Valerie Roys on: 05/13/2022 09:40 AM   Modules accepted: Orders

## 2022-05-14 ENCOUNTER — Encounter: Payer: Self-pay | Admitting: Internal Medicine

## 2022-05-21 ENCOUNTER — Telehealth: Payer: Self-pay

## 2022-05-21 NOTE — Telephone Encounter (Signed)
Pt lvm on my voicemail requesting to schedule appt for dysphagia.  She has a referral in the workque.  I've informed her that I will have front office to call her to schedule her appt.  Thank you,,  Sharyn Lull, CMA

## 2022-06-13 ENCOUNTER — Ambulatory Visit
Payer: No Typology Code available for payment source | Attending: Student in an Organized Health Care Education/Training Program | Admitting: Student in an Organized Health Care Education/Training Program

## 2022-06-13 ENCOUNTER — Encounter: Payer: Self-pay | Admitting: Student in an Organized Health Care Education/Training Program

## 2022-06-13 ENCOUNTER — Encounter: Payer: Self-pay | Admitting: Internal Medicine

## 2022-06-13 VITALS — BP 130/91 | HR 76 | Temp 97.3°F | Resp 16 | Ht 67.0 in | Wt 190.0 lb

## 2022-06-13 DIAGNOSIS — F419 Anxiety disorder, unspecified: Secondary | ICD-10-CM | POA: Diagnosis present

## 2022-06-13 DIAGNOSIS — G43009 Migraine without aura, not intractable, without status migrainosus: Secondary | ICD-10-CM | POA: Insufficient documentation

## 2022-06-13 DIAGNOSIS — G894 Chronic pain syndrome: Secondary | ICD-10-CM | POA: Diagnosis present

## 2022-06-13 DIAGNOSIS — Z9884 Bariatric surgery status: Secondary | ICD-10-CM | POA: Insufficient documentation

## 2022-06-13 DIAGNOSIS — M797 Fibromyalgia: Secondary | ICD-10-CM | POA: Insufficient documentation

## 2022-06-13 DIAGNOSIS — F32A Depression, unspecified: Secondary | ICD-10-CM | POA: Diagnosis present

## 2022-06-13 DIAGNOSIS — M109 Gout, unspecified: Secondary | ICD-10-CM | POA: Diagnosis present
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
## 2022-06-13 NOTE — Progress Notes (Signed)
Safety precautions to be maintained throughout the outpatient stay will include: orient to surroundings, keep bed in low position, maintain call bell within reach at all times, provide assistance with transfer out of bed and ambulation.  

## 2022-06-18 ENCOUNTER — Other Ambulatory Visit: Payer: Self-pay | Admitting: Family Medicine

## 2022-06-18 NOTE — Telephone Encounter (Signed)
Medication Refill - Medication: albuterol (VENTOLIN HFA) 108 (90 Base) MCG/ACT inhaler  Has the patient contacted their pharmacy? Yes.   Pharmacy said patient needed a new rx.  Preferred Pharmacy (with phone number or street name):  [facility], [place] Phone:  [phone]  Fax:  [phone]     Has the patient been seen for an appointment in the last year OR does the patient have an upcoming appointment? Yes.    Agent: Please be advised that RX refills may take up to 3 business days. We ask that you follow-up with your pharmacy.

## 2022-06-18 NOTE — Telephone Encounter (Signed)
Requested medications are due for refill today.  unsure  Requested medications are on the active medications list.  Yes - as historical  Last refill. 11/29/2019  Future visit scheduled.   Yes   Notes to clinic.  Medication listed as historical.    Requested Prescriptions  Pending Prescriptions Disp Refills   albuterol (VENTOLIN HFA) 108 (90 Base) MCG/ACT inhaler       Pulmonology:  Beta Agonists 2 Failed - 06/18/2022 10:58 AM      Failed - Last BP in normal range    BP Readings from Last 1 Encounters:  06/13/22 (!) 130/91         Passed - Last Heart Rate in normal range    Pulse Readings from Last 1 Encounters:  06/13/22 76         Passed - Valid encounter within last 12 months    Recent Outpatient Visits           2 months ago Dysphagia, unspecified type   [facility] [place], Megan P, DO   5 months ago Iron deficiency anemia, unspecified iron deficiency anemia type   [facility] [place], Megan P, DO   6 months ago Anemia, unspecified type   [facility] [place], Avanti, MD   9 months ago Migraine without aura and without status migrainosus, not intractable   [place], Lauren A, NP   10 months ago Routine general medical examination at a health care facility   [place], Valinda, DO       Future Appointments             In 3 months [name], Barb Merino, DO [employer], [facility]

## 2022-06-24 MED ORDER — ALBUTEROL SULFATE HFA 108 (90 BASE) MCG/ACT IN AERS
INHALATION_SPRAY | RESPIRATORY_TRACT | 1 refills | Status: DC
Start: 2022-06-24 — End: 2022-10-03

## 2022-08-07 ENCOUNTER — Ambulatory Visit: Payer: No Typology Code available for payment source | Admitting: Family Medicine

## 2022-08-07 ENCOUNTER — Encounter: Payer: Self-pay | Admitting: Family Medicine

## 2022-08-07 ENCOUNTER — Encounter: Payer: Self-pay | Admitting: Internal Medicine

## 2022-08-07 VITALS — BP 112/77 | HR 73 | Temp 97.9°F | Wt 194.5 lb

## 2022-08-07 DIAGNOSIS — N3001 Acute cystitis with hematuria: Secondary | ICD-10-CM

## 2022-08-07 DIAGNOSIS — H6992 Unspecified Eustachian tube disorder, left ear: Secondary | ICD-10-CM

## 2022-08-07 DIAGNOSIS — Z23 Encounter for immunization: Secondary | ICD-10-CM

## 2022-08-07 DIAGNOSIS — R300 Dysuria: Secondary | ICD-10-CM

## 2022-08-07 LAB — MICROSCOPIC EXAMINATION: Bacteria, UA: NONE SEEN

## 2022-08-07 LAB — URINALYSIS, ROUTINE W REFLEX MICROSCOPIC
Bilirubin, UA: NEGATIVE
Glucose, UA: NEGATIVE
Ketones, UA: NEGATIVE
Nitrite, UA: NEGATIVE
Specific Gravity, UA: 1.015 (ref 1.005–1.030)
Urobilinogen, Ur: 1 mg/dL (ref 0.2–1.0)
pH, UA: 6.5 (ref 5.0–7.5)

## 2022-08-07 MED ORDER — NITROFURANTOIN MONOHYD MACRO 100 MG PO CAPS: 100.0000 mg | ORAL_CAPSULE | Freq: Two times a day (BID) | ORAL | 0 refills | Status: DC

## 2022-08-07 MED ORDER — PREDNISONE 50 MG PO TABS: 50.0000 mg | ORAL_TABLET | Freq: Every day | ORAL | 0 refills | Status: DC

## 2022-08-07 NOTE — Progress Notes (Signed)
BP 112/77   Pulse 73   Temp 97.9 F (36.6 C)   Wt 194 lb 8 oz (88.2 kg)   SpO2 99%   BMI 30.46 kg/m    Subjective:    Patient ID: Tina Harvey, female    DOB: Aug 03, 1971, 51 y.o.   MRN: [id number]  HPI: Tina Harvey is a 51 y.o. female  Chief Complaint  Patient presents with   Urinary Tract Infection    Patient states she is having burning with urination, urinary frequency and urgency for a week and a half    Ear Pain    Patient states her left ear has been hurting off and on for about 3 weeks.    URINARY SYMPTOMS Duration: 10 days Dysuria: yes Urinary frequency: yes Urgency: yes Small volume voids: yes Symptom severity: moderate Urinary incontinence: yes Foul odor: yes Hematuria: no Abdominal pain: no Back pain: yes Suprapubic pain/pressure: no Flank pain: yes Fever:  yes Vomiting: no Relief with cranberry juice: no Relief with pyridium: no Status: stable Previous urinary tract infection: no Recurrent urinary tract infection: no Sexual activity: monogomous History of sexually transmitted disease: no Vaginal discharge: no Treatments attempted: increasing fluids   EAR PAIN Duration: 3 weeks Involved ear(s): left Severity:  moderate  Quality:  aching pain Fever: yes Otorrhea: no Upper respiratory infection symptoms: yes Pruritus: yes Hearing loss: yes Water immersion no Using Q-tips: no Recurrent otitis media: no Status: stable Treatments attempted: none  Relevant past medical, surgical, family and social history reviewed and updated as indicated. Interim medical history since our last visit reviewed. Allergies and medications reviewed and updated.  Review of Systems  Constitutional:  Positive for fever. Negative for activity change, appetite change, chills, diaphoresis, fatigue and unexpected weight change.  HENT: Negative.    Respiratory: Negative.    Cardiovascular: Negative.   Gastrointestinal: Negative.   Genitourinary:  Positive for  dysuria, frequency and urgency. Negative for decreased urine volume, difficulty urinating, dyspareunia, enuresis, flank pain, genital sores, hematuria, menstrual problem, pelvic pain, vaginal bleeding, vaginal discharge and vaginal pain.  Musculoskeletal: Negative.   Neurological: Negative.   Psychiatric/Behavioral: Negative.      Per HPI unless specifically indicated above     Objective:    BP 112/77   Pulse 73   Temp 97.9 F (36.6 C)   Wt 194 lb 8 oz (88.2 kg)   SpO2 99%   BMI 30.46 kg/m   Wt Readings from Last 3 Encounters:  08/07/22 194 lb 8 oz (88.2 kg)  06/13/22 190 lb (86.2 kg)  04/09/22 190 lb (86.2 kg)    Physical Exam Vitals and nursing note reviewed.  Constitutional:      General: She is not in acute distress.    Appearance: Normal appearance. She is normal weight. She is not ill-appearing, toxic-appearing or diaphoretic.  HENT:     Head: Normocephalic and atraumatic.     Right Ear: Tympanic membrane, ear canal and external ear normal. There is no impacted cerumen.     Left Ear: Tympanic membrane, ear canal and external ear normal. There is no impacted cerumen.     Nose: Nose normal. No congestion or rhinorrhea.     Mouth/Throat:     Mouth: Mucous membranes are moist.     Pharynx: Oropharynx is clear. No oropharyngeal exudate or posterior oropharyngeal erythema.  Eyes:     General: No scleral icterus.       Right eye: No discharge.  Left eye: No discharge.     Extraocular Movements: Extraocular movements intact.     Conjunctiva/sclera: Conjunctivae normal.     Pupils: Pupils are equal, round, and reactive to light.  Cardiovascular:     Rate and Rhythm: Normal rate and regular rhythm.     Pulses: Normal pulses.     Heart sounds: Normal heart sounds. No murmur heard.    No friction rub. No gallop.  Pulmonary:     Effort: Pulmonary effort is normal. No respiratory distress.     Breath sounds: Normal breath sounds. No stridor. No wheezing, rhonchi or  rales.  Chest:     Chest wall: No tenderness.  Musculoskeletal:        General: Normal range of motion.     Cervical back: Normal range of motion and neck supple.  Skin:    General: Skin is warm and dry.     Capillary Refill: Capillary refill takes less than 2 seconds.     Coloration: Skin is not jaundiced or pale.     Findings: No bruising, erythema, lesion or rash.  Neurological:     General: No focal deficit present.     Mental Status: She is alert and oriented to person, place, and time. Mental status is at baseline.  Psychiatric:        Mood and Affect: Mood normal.        Behavior: Behavior normal.        Thought Content: Thought content normal.        Judgment: Judgment normal.     Results for orders placed or performed in visit on 04/08/22  Vitamin B12  Result Value Ref Range   Vitamin B-12 818 180 - 914 pg/mL  Ferritin  Result Value Ref Range   Ferritin 62 11 - 307 ng/mL  Iron and TIBC  Result Value Ref Range   Iron 90 28 - 170 ug/dL   TIBC 405 250 - 450 ug/dL   Saturation Ratios 22 10.4 - 31.8 %   UIBC 315 ug/dL  Basic metabolic panel  Result Value Ref Range   Sodium 136 135 - 145 mmol/L   Potassium 3.0 (L) 3.5 - 5.1 mmol/L   Chloride 98 98 - 111 mmol/L   CO2 29 22 - 32 mmol/L   Glucose, Bld 82 70 - 99 mg/dL   BUN 13 6 - 20 mg/dL   Creatinine, Ser 0.95 0.44 - 1.00 mg/dL   Calcium 9.3 8.9 - 10.3 mg/dL   GFR, Estimated >60 >60 mL/min   Anion gap 9 5 - 15  CBC with Differential/Platelet  Result Value Ref Range   WBC 9.0 4.0 - 10.5 K/uL   RBC 4.47 3.87 - 5.11 MIL/uL   Hemoglobin 12.7 12.0 - 15.0 g/dL   HCT 38.4 36.0 - 46.0 %   MCV 85.9 80.0 - 100.0 fL   MCH 28.4 26.0 - 34.0 pg   MCHC 33.1 30.0 - 36.0 g/dL   RDW 14.4 11.5 - 15.5 %   Platelets 270 150 - 400 K/uL   nRBC 0.0 0.0 - 0.2 %   Neutrophils Relative % 56 %   Neutro Abs 4.9 1.7 - 7.7 K/uL   Lymphocytes Relative 32 %   Lymphs Abs 2.9 0.7 - 4.0 K/uL   Monocytes Relative 9 %   Monocytes Absolute  0.8 0.1 - 1.0 K/uL   Eosinophils Relative 3 %   Eosinophils Absolute 0.3 0.0 - 0.5 K/uL   Basophils Relative 0 %  Basophils Absolute 0.0 0.0 - 0.1 K/uL   Immature Granulocytes 0 %   Abs Immature Granulocytes 0.04 0.00 - 0.07 K/uL      Assessment & Plan:   Problem List Items Addressed This Visit   None Visit Diagnoses     Acute cystitis with hematuria    -  Primary   Will treat with nitrofurantoin and await culture. Call with any concerns. Continue to monitor.   Relevant Orders   Urine Culture   ETD (Eustachian tube dysfunction), left       Will treat with prednisone burst. Call if not getting better or getting worse. Continue to monitor.   Dysuria       Checking urine today.   Relevant Orders   Urinalysis, Routine w reflex microscopic        Follow up plan: Return if symptoms worsen or fail to improve.

## 2022-08-09 LAB — URINE CULTURE

## 2022-08-14 ENCOUNTER — Inpatient Hospital Stay: Payer: No Typology Code available for payment source

## 2022-08-14 ENCOUNTER — Inpatient Hospital Stay: Payer: No Typology Code available for payment source | Attending: Internal Medicine | Admitting: Medical Oncology

## 2022-08-14 ENCOUNTER — Encounter: Payer: Self-pay | Admitting: Medical Oncology

## 2022-08-14 VITALS — BP 113/89 | HR 91 | Temp 98.0°F | Wt 193.0 lb

## 2022-08-14 DIAGNOSIS — Z803 Family history of malignant neoplasm of breast: Secondary | ICD-10-CM | POA: Insufficient documentation

## 2022-08-14 DIAGNOSIS — Z8541 Personal history of malignant neoplasm of cervix uteri: Secondary | ICD-10-CM | POA: Diagnosis not present

## 2022-08-14 DIAGNOSIS — D508 Other iron deficiency anemias: Secondary | ICD-10-CM | POA: Insufficient documentation

## 2022-08-14 DIAGNOSIS — E611 Iron deficiency: Secondary | ICD-10-CM

## 2022-08-14 DIAGNOSIS — Z9884 Bariatric surgery status: Secondary | ICD-10-CM | POA: Insufficient documentation

## 2022-08-14 DIAGNOSIS — Z87891 Personal history of nicotine dependence: Secondary | ICD-10-CM | POA: Insufficient documentation

## 2022-08-14 DIAGNOSIS — Z808 Family history of malignant neoplasm of other organs or systems: Secondary | ICD-10-CM | POA: Diagnosis not present

## 2022-08-14 LAB — COMPREHENSIVE METABOLIC PANEL
ALT: 35 U/L (ref 0–44)
AST: 29 U/L (ref 15–41)
Albumin: 4 g/dL (ref 3.5–5.0)
Alkaline Phosphatase: 76 U/L (ref 38–126)
Anion gap: 11 (ref 5–15)
BUN: 19 mg/dL (ref 6–20)
CO2: 28 mmol/L (ref 22–32)
Calcium: 9.2 mg/dL (ref 8.9–10.3)
Chloride: 96 mmol/L — ABNORMAL LOW (ref 98–111)
Creatinine, Ser: 1.02 mg/dL — ABNORMAL HIGH (ref 0.44–1.00)
GFR, Estimated: >60 mL/min (ref >60)
Glucose, Bld: 177 mg/dL — ABNORMAL HIGH (ref 70–99)
Potassium: 3.2 mmol/L — ABNORMAL LOW (ref 3.5–5.1)
Sodium: 135 mmol/L (ref 135–145)
Total Bilirubin: 0.5 mg/dL (ref 0.3–1.2)
Total Protein: 8 g/dL (ref 6.5–8.1)

## 2022-08-14 LAB — CBC WITH DIFFERENTIAL/PLATELET
Abs Immature Granulocytes: 0.05 10*3/uL (ref 0.00–0.07)
Basophils Absolute: 0 10*3/uL (ref 0.0–0.1)
Basophils Relative: 0 %
Eosinophils Absolute: 0.1 10*3/uL (ref 0.0–0.5)
Eosinophils Relative: 1 %
HCT: 41.4 % (ref 36.0–46.0)
Hemoglobin: 14.1 g/dL (ref 12.0–15.0)
Immature Granulocytes: 1 %
Lymphocytes Relative: 23 %
Lymphs Abs: 2.4 10*3/uL (ref 0.7–4.0)
MCH: 29.5 pg (ref 26.0–34.0)
MCHC: 34.1 g/dL (ref 30.0–36.0)
MCV: 86.6 fL (ref 80.0–100.0)
Monocytes Absolute: 0.6 10*3/uL (ref 0.1–1.0)
Monocytes Relative: 6 %
Neutro Abs: 7.3 10*3/uL (ref 1.7–7.7)
Neutrophils Relative %: 69 %
Platelets: 326 10*3/uL (ref 150–400)
RBC: 4.78 MIL/uL (ref 3.87–5.11)
RDW: 13.2 % (ref 11.5–15.5)
WBC: 10.6 10*3/uL — ABNORMAL HIGH (ref 4.0–10.5)
nRBC: 0 % (ref 0.0–0.2)

## 2022-08-14 LAB — IRON AND TIBC
Iron: 93 ug/dL (ref 28–170)
Saturation Ratios: 25 % (ref 10.4–31.8)
TIBC: 368 ug/dL (ref 250–450)
UIBC: 275 ug/dL

## 2022-08-14 LAB — FERRITIN: Ferritin: 102 ng/mL (ref 11–307)

## 2022-08-14 LAB — VITAMIN B12: Vitamin B-12: 669 pg/mL (ref 180–914)

## 2022-08-14 MED ORDER — SODIUM CHLORIDE 0.9 % IV SOLN
200.0000 mg | Freq: Once | INTRAVENOUS | Status: AC
  Administered 2022-08-14: 200 mg via INTRAVENOUS
  Filled 2022-08-14: qty 200

## 2022-08-14 MED ORDER — SODIUM CHLORIDE 0.9 % IV SOLN
Freq: Once | INTRAVENOUS | Status: AC
  Filled 2022-08-14: qty 250

## 2022-08-14 MED ORDER — ONDANSETRON HCL 4 MG/2ML IJ SOLN
4.0000 mg | Freq: Once | INTRAMUSCULAR | Status: AC
  Administered 2022-08-14: 4 mg via INTRAVENOUS
  Filled 2022-08-14: qty 2

## 2022-08-14 NOTE — Patient Instructions (Signed)
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
## 2022-08-19 ENCOUNTER — Telehealth: Payer: Self-pay | Admitting: Family Medicine

## 2022-08-19 MED ORDER — FLUCONAZOLE 150 MG PO TABS: 150.0000 mg | ORAL_TABLET | Freq: Every day | ORAL | 0 refills | Status: DC

## 2022-08-19 NOTE — Telephone Encounter (Signed)
The patient called in stating she forgot to mention she needs a script called in for Diflucan to prevent yeast infection while taking her antibiotic. She uses  [facility], [place] Phone: [phone]  Fax: [phone]      Please assist patient further

## 2022-09-17 ENCOUNTER — Other Ambulatory Visit: Payer: Self-pay | Admitting: Family Medicine

## 2022-09-17 NOTE — Telephone Encounter (Signed)
Rx 04/01/22 #90 1RF- too soon Requested Prescriptions  Pending Prescriptions Disp Refills   pantoprazole (PROTONIX) 40 MG tablet [Pharmacy Med Name: Pantoprazole Sodium 40 MG Oral Tablet Delayed Release] 90 tablet 0    Sig: Take 1 tablet by mouth once daily     Gastroenterology: Proton Pump Inhibitors Passed - 09/17/2022  9:05 AM      Passed - Valid encounter within last 12 months    Recent Outpatient Visits           1 month ago Acute cystitis with hematuria   [place], Megan P, DO   5 months ago Dysphagia, unspecified type   [facility], Megan P, DO   8 months ago Iron deficiency anemia, unspecified iron deficiency anemia type   [facility], Megan P, DO   9 months ago Anemia, unspecified type   [facility] [place], Avanti, MD   12 months ago Migraine without aura and without status migrainosus, not intractable   [place], Lauren A, NP       Future Appointments             In 2 weeks [place], Barb Merino, DO [place]

## 2022-10-03 ENCOUNTER — Ambulatory Visit (INDEPENDENT_AMBULATORY_CARE_PROVIDER_SITE_OTHER): Payer: No Typology Code available for payment source | Admitting: Family Medicine

## 2022-10-03 ENCOUNTER — Encounter: Payer: Self-pay | Admitting: Family Medicine

## 2022-10-03 VITALS — BP 128/85 | HR 67 | Temp 97.9°F | Ht 67.4 in | Wt 196.7 lb

## 2022-10-03 DIAGNOSIS — I10 Essential (primary) hypertension: Secondary | ICD-10-CM

## 2022-10-03 DIAGNOSIS — Z0000 Encounter for general adult medical examination without abnormal findings: Secondary | ICD-10-CM

## 2022-10-03 DIAGNOSIS — E782 Mixed hyperlipidemia: Secondary | ICD-10-CM | POA: Diagnosis not present

## 2022-10-03 DIAGNOSIS — M109 Gout, unspecified: Secondary | ICD-10-CM

## 2022-10-03 DIAGNOSIS — Z23 Encounter for immunization: Secondary | ICD-10-CM | POA: Diagnosis not present

## 2022-10-03 DIAGNOSIS — Z8639 Personal history of other endocrine, nutritional and metabolic disease: Secondary | ICD-10-CM

## 2022-10-03 DIAGNOSIS — F419 Anxiety disorder, unspecified: Secondary | ICD-10-CM | POA: Diagnosis not present

## 2022-10-03 DIAGNOSIS — E538 Deficiency of other specified B group vitamins: Secondary | ICD-10-CM | POA: Diagnosis not present

## 2022-10-03 DIAGNOSIS — Z1231 Encounter for screening mammogram for malignant neoplasm of breast: Secondary | ICD-10-CM

## 2022-10-03 DIAGNOSIS — M8000XS Age-related osteoporosis with current pathological fracture, unspecified site, sequela: Secondary | ICD-10-CM

## 2022-10-03 DIAGNOSIS — D509 Iron deficiency anemia, unspecified: Secondary | ICD-10-CM

## 2022-10-03 DIAGNOSIS — E559 Vitamin D deficiency, unspecified: Secondary | ICD-10-CM

## 2022-10-03 DIAGNOSIS — F32A Depression, unspecified: Secondary | ICD-10-CM

## 2022-10-03 DIAGNOSIS — I83812 Varicose veins of left lower extremities with pain: Secondary | ICD-10-CM

## 2022-10-03 LAB — URINALYSIS, ROUTINE W REFLEX MICROSCOPIC
Bilirubin, UA: NEGATIVE
Glucose, UA: NEGATIVE
Ketones, UA: NEGATIVE
Nitrite, UA: NEGATIVE
Protein,UA: NEGATIVE
Specific Gravity, UA: 1.02 (ref 1.005–1.030)
Urobilinogen, Ur: 1 mg/dL (ref 0.2–1.0)
pH, UA: 7 (ref 5.0–7.5)

## 2022-10-03 LAB — MICROALBUMIN, URINE WAIVED
Creatinine, Urine Waived: 50 mg/dL (ref 10–300)
Microalb, Ur Waived: 30 mg/L — ABNORMAL HIGH (ref 0–19)

## 2022-10-03 LAB — MICROSCOPIC EXAMINATION: Bacteria, UA: NONE SEEN

## 2022-10-03 LAB — BAYER DCA HB A1C WAIVED: HB A1C (BAYER DCA - WAIVED): 5.7 % — ABNORMAL HIGH (ref 4.8–5.6)

## 2022-10-03 MED ORDER — VITAMIN D (ERGOCALCIFEROL) 1.25 MG (50000 UNIT) PO CAPS: 50000.0000 [IU] | ORAL_CAPSULE | ORAL | 0 refills | Status: DC

## 2022-10-03 MED ORDER — LISINOPRIL-HYDROCHLOROTHIAZIDE 20-25 MG PO TABS: 1.0000 | ORAL_TABLET | Freq: Every day | ORAL | 1 refills | Status: DC

## 2022-10-03 MED ORDER — ATORVASTATIN CALCIUM 10 MG PO TABS: 10.0000 mg | ORAL_TABLET | Freq: Every day | ORAL | 1 refills | Status: DC

## 2022-10-03 MED ORDER — ALBUTEROL SULFATE HFA 108 (90 BASE) MCG/ACT IN AERS: INHALATION_SPRAY | RESPIRATORY_TRACT | 1 refills | Status: DC

## 2022-10-03 MED ORDER — ESCITALOPRAM OXALATE 10 MG PO TABS: 10.0000 mg | ORAL_TABLET | Freq: Every day | ORAL | 1 refills | Status: DC

## 2022-10-03 MED ORDER — PANTOPRAZOLE SODIUM 40 MG PO TBEC: 40.0000 mg | DELAYED_RELEASE_TABLET | Freq: Every day | ORAL | 1 refills | Status: DC

## 2022-10-03 MED ORDER — BUSPIRONE HCL 5 MG PO TABS: 5.0000 mg | ORAL_TABLET | Freq: Three times a day (TID) | ORAL | 6 refills | Status: DC

## 2022-10-03 MED ORDER — CYANOCOBALAMIN 1000 MCG/ML IJ SOLN: 1000.0000 ug | INTRAMUSCULAR | 5 refills | Status: DC
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
## 2022-10-04 ENCOUNTER — Other Ambulatory Visit: Payer: Self-pay | Admitting: Family Medicine

## 2022-10-04 LAB — COMPREHENSIVE METABOLIC PANEL
ALT: 20 IU/L (ref 0–32)
AST: 27 IU/L (ref 0–40)
Albumin/Globulin Ratio: 1.6 (ref 1.2–2.2)
Albumin: 4.6 g/dL (ref 3.9–4.9)
Alkaline Phosphatase: 88 IU/L (ref 44–121)
BUN/Creatinine Ratio: 13 (ref 9–23)
BUN: 11 mg/dL (ref 6–24)
Bilirubin Total: 0.4 mg/dL (ref 0.0–1.2)
CO2: 30 mmol/L — ABNORMAL HIGH (ref 20–29)
Calcium: 9.6 mg/dL (ref 8.7–10.2)
Chloride: 99 mmol/L (ref 96–106)
Creatinine, Ser: 0.82 mg/dL (ref 0.57–1.00)
Globulin, Total: 2.9 g/dL (ref 1.5–4.5)
Glucose: 80 mg/dL (ref 70–99)
Potassium: 3.5 mmol/L (ref 3.5–5.2)
Sodium: 143 mmol/L (ref 134–144)
Total Protein: 7.5 g/dL (ref 6.0–8.5)
eGFR: 87 mL/min/{1.73_m2} (ref >59)

## 2022-10-04 LAB — TSH: TSH: 1.82 u[IU]/mL (ref 0.450–4.500)

## 2022-10-04 LAB — CBC WITH DIFFERENTIAL/PLATELET
Basophils Absolute: 0 10*3/uL (ref 0.0–0.2)
Basos: 0 %
EOS (ABSOLUTE): 0.4 10*3/uL (ref 0.0–0.4)
Eos: 5 %
Hematocrit: 38.4 % (ref 34.0–46.6)
Hemoglobin: 12.8 g/dL (ref 11.1–15.9)
Immature Grans (Abs): 0 10*3/uL (ref 0.0–0.1)
Immature Granulocytes: 0 %
Lymphocytes Absolute: 2.6 10*3/uL (ref 0.7–3.1)
Lymphs: 35 %
MCH: 29.6 pg (ref 26.6–33.0)
MCHC: 33.3 g/dL (ref 31.5–35.7)
MCV: 89 fL (ref 79–97)
Monocytes Absolute: 0.6 10*3/uL (ref 0.1–0.9)
Monocytes: 7 %
Neutrophils Absolute: 4 10*3/uL (ref 1.4–7.0)
Neutrophils: 53 %
Platelets: 299 10*3/uL (ref 150–450)
RBC: 4.32 x10E6/uL (ref 3.77–5.28)
RDW: 13.7 % (ref 11.7–15.4)
WBC: 7.5 10*3/uL (ref 3.4–10.8)

## 2022-10-04 LAB — LIPID PANEL W/O CHOL/HDL RATIO
Cholesterol, Total: 208 mg/dL — ABNORMAL HIGH (ref 100–199)
HDL: 45 mg/dL (ref >39)
LDL Chol Calc (NIH): 123 mg/dL — ABNORMAL HIGH (ref 0–99)
Triglycerides: 227 mg/dL — ABNORMAL HIGH (ref 0–149)
VLDL Cholesterol Cal: 40 mg/dL (ref 5–40)

## 2022-10-04 LAB — URIC ACID: Uric Acid: 5.8 mg/dL (ref 2.6–6.2)

## 2022-10-04 LAB — VITAMIN B12: Vitamin B-12: 1034 pg/mL (ref 232–1245)

## 2022-10-04 LAB — FERRITIN: Ferritin: 216 ng/mL — ABNORMAL HIGH (ref 15–150)

## 2022-10-04 LAB — VITAMIN D 25 HYDROXY (VIT D DEFICIENCY, FRACTURES): Vit D, 25-Hydroxy: 34 ng/mL (ref 30.0–100.0)

## 2022-10-06 ENCOUNTER — Encounter: Payer: Self-pay | Admitting: Family Medicine

## 2022-10-06 NOTE — Assessment & Plan Note (Signed)
Rechecking labs today. Await results. Treat as needed.  °

## 2022-10-06 NOTE — Assessment & Plan Note (Signed)
Under good control on current regimen. Continue current regimen. Continue to monitor. Call with any concerns. Refills given. Labs drawn today.   

## 2022-10-06 NOTE — Assessment & Plan Note (Signed)
Due for DEXA. Ordered today. Await results.

## 2022-10-23 ENCOUNTER — Encounter: Payer: Self-pay | Admitting: Family Medicine

## 2022-11-02 ENCOUNTER — Other Ambulatory Visit: Payer: Self-pay | Admitting: Family Medicine

## 2022-11-04 NOTE — Telephone Encounter (Signed)
Unable to refill per protocol, Rx request is too soon. Last refill 10/03/22 for 90 and 1 refill.  Requested Prescriptions  Pending Prescriptions Disp Refills   lisinopril-hydrochlorothiazide (ZESTORETIC) 20-25 MG tablet [Pharmacy Med Name: Lisinopril-hydroCHLOROthiazide 20-25 MG Oral Tablet] 90 tablet 0    Sig: Take 1 tablet by mouth once daily     Cardiovascular:  ACEI + Diuretic Combos Passed - 11/02/2022 10:38 AM      Passed - Na in normal range and within 180 days    Sodium  Date Value Ref Range Status  10/03/2022 143 134 - 144 mmol/L Final  10/10/2014 140 136 - 145 mmol/L Final         Passed - K in normal range and within 180 days    Potassium  Date Value Ref Range Status  10/03/2022 3.5 3.5 - 5.2 mmol/L Final  10/10/2014 3.6 3.5 - 5.1 mmol/L Final         Passed - Cr in normal range and within 180 days    Creatinine  Date Value Ref Range Status  10/10/2014 0.86 0.60 - 1.30 mg/dL Final   Creatinine, Ser  Date Value Ref Range Status  10/03/2022 0.82 0.57 - 1.00 mg/dL Final         Passed - eGFR is 30 or above and within 180 days    EGFR (African American)  Date Value Ref Range Status  10/10/2014 >60 >79m/min Final  02/23/2014 >60  Final   GFR calc Af Amer  Date Value Ref Range Status  08/08/2020 91 >59 mL/min/1.73 Final    Comment:    **In accordance with recommendations from the NKF-ASN Task force,**   [facility] is in the process of updating its eGFR calculation to the   2021 CKD-EPI creatinine equation that estimates kidney function   without a race variable.    EGFR (Non-African Amer.)  Date Value Ref Range Status  10/10/2014 >60 >672mmin Final    Comment:    eGFR values <6020min/1.73 m2 may be an indication of chronic kidney disease (CKD). Calculated eGFR, using the MRDR Study equation, is useful in  patients with stable renal function. The eGFR calculation will not be reliable in acutely ill patients when serum creatinine is changing rapidly. It is not  useful in patients on dialysis. The eGFR calculation may not be applicable to patients at the low and high extremes of body sizes, pregnant women, and vegetarians.   02/23/2014 >60  Final    Comment:    eGFR values <60m76mn/1.73 m2 may be an indication of chronic kidney disease (CKD). Calculated eGFR is useful in patients with stable renal function. The eGFR calculation will not be reliable in acutely ill patients when serum creatinine is changing rapidly. It is not useful in  patients on dialysis. The eGFR calculation may not be applicable to patients at the low and high extremes of body sizes, pregnant women, and vegetarians.    GFR, Estimated  Date Value Ref Range Status  08/14/2022 >60 >60 mL/min Final    Comment:    (NOTE) Calculated using the CKD-EPI Creatinine Equation (2021)    eGFR  Date Value Ref Range Status  10/03/2022 87 >59 mL/min/1.73 Final         Passed - Patient is not pregnant      Passed - Last BP in normal range    BP Readings from Last 1 Encounters:  10/03/22 128/85         Passed - Valid encounter within last 6  months    Recent Outpatient Visits           1 month ago Routine general medical examination at a health care facility   [place], [place] P, DO   2 months ago Acute cystitis with hematuria   [facility] [place], [place] P, DO   7 months ago Dysphagia, unspecified type   [facility] [place], Megan P, DO   10 months ago Iron deficiency anemia, unspecified iron deficiency anemia type   [facility] [place], Megan P, DO   10 months ago Anemia, unspecified type   [place], MD       Future Appointments             In 5 months [name], Barb Merino, DO [place]

## 2022-11-05 ENCOUNTER — Other Ambulatory Visit (INDEPENDENT_AMBULATORY_CARE_PROVIDER_SITE_OTHER): Payer: Self-pay | Admitting: Nurse Practitioner

## 2022-11-05 DIAGNOSIS — I83812 Varicose veins of left lower extremities with pain: Secondary | ICD-10-CM

## 2022-11-07 ENCOUNTER — Ambulatory Visit (INDEPENDENT_AMBULATORY_CARE_PROVIDER_SITE_OTHER): Payer: No Typology Code available for payment source

## 2022-11-07 ENCOUNTER — Ambulatory Visit (INDEPENDENT_AMBULATORY_CARE_PROVIDER_SITE_OTHER): Payer: No Typology Code available for payment source | Admitting: Nurse Practitioner

## 2022-11-07 ENCOUNTER — Encounter (INDEPENDENT_AMBULATORY_CARE_PROVIDER_SITE_OTHER): Payer: Self-pay | Admitting: Nurse Practitioner

## 2022-11-07 VITALS — BP 165/84 | HR 76 | Resp 18 | Ht 67.5 in | Wt 194.8 lb

## 2022-11-07 DIAGNOSIS — I83812 Varicose veins of left lower extremities with pain: Secondary | ICD-10-CM

## 2022-11-07 DIAGNOSIS — M797 Fibromyalgia: Secondary | ICD-10-CM

## 2022-11-25 ENCOUNTER — Encounter (INDEPENDENT_AMBULATORY_CARE_PROVIDER_SITE_OTHER): Payer: Self-pay | Admitting: Nurse Practitioner
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
## 2022-11-28 ENCOUNTER — Ambulatory Visit
Admission: RE | Admit: 2022-11-28 | Discharge: 2022-11-28 | Disposition: A | Discharge status: Home / Self Care | Payer: No Typology Code available for payment source | Source: Ambulatory Visit | Attending: Family Medicine | Admitting: Family Medicine

## 2022-11-28 DIAGNOSIS — M8000XS Age-related osteoporosis with current pathological fracture, unspecified site, sequela: Secondary | ICD-10-CM | POA: Insufficient documentation

## 2022-11-28 DIAGNOSIS — Z1231 Encounter for screening mammogram for malignant neoplasm of breast: Secondary | ICD-10-CM

## 2022-12-01 ENCOUNTER — Other Ambulatory Visit: Payer: Self-pay | Admitting: Family Medicine

## 2022-12-01 MED ORDER — IBANDRONATE SODIUM 150 MG PO TABS: 150.0000 mg | ORAL_TABLET | ORAL | 4 refills | Status: DC

## 2022-12-10 ENCOUNTER — Encounter: Payer: Self-pay | Admitting: Nurse Practitioner

## 2022-12-10 ENCOUNTER — Ambulatory Visit: Payer: Self-pay

## 2022-12-10 ENCOUNTER — Telehealth (INDEPENDENT_AMBULATORY_CARE_PROVIDER_SITE_OTHER): Payer: No Typology Code available for payment source | Admitting: Nurse Practitioner

## 2022-12-10 DIAGNOSIS — H1033 Unspecified acute conjunctivitis, bilateral: Secondary | ICD-10-CM | POA: Diagnosis not present

## 2022-12-10 MED ORDER — CIPROFLOXACIN HCL 0.3 % OP SOLN: 1.0000 [drp] | Freq: Three times a day (TID) | OPHTHALMIC | 0 refills | Status: AC

## 2022-12-10 NOTE — Progress Notes (Signed)
There were no vitals taken for this visit.   Subjective:    Patient ID: Tina Harvey, female    DOB: [birth year], 52 y.o.   MRN: [id number]  HPI: Tina Harvey is a 52 y.o. female  Chief Complaint  Patient presents with   Eye Problem    Patient states she thinks she has pink eye in her R eye. States her eye started swelling, being red, and itching for the last 3 days. States she woke up this morning with her eye crusty.    EYE PROBLEM Duration:  days Involved eye:  bilateral Onset: sudden Severity: moderate  Quality: burning and itchy Foreign body sensation:yes Visual impairment: yes Eye redness: yes Discharge: yes Crusting or matting of eyelids: yes Swelling: yes Photophobia: yes Itching: yes Tearing: yes Headache: no Floaters: no URI symptoms: no Contact lens use: no Close contacts with similar problems: no Eye trauma: no Aggravating factors:  Alleviating factors:  Status: worse Treatments attempted: cold compress  Relevant past medical, surgical, family and social history reviewed and updated as indicated. Interim medical history since our last visit reviewed. Allergies and medications reviewed and updated.  Review of Systems  Eyes:  Positive for photophobia, discharge, redness and itching.    Per HPI unless specifically indicated above     Objective:    There were no vitals taken for this visit.  Wt Readings from Last 3 Encounters:  11/07/22 194 lb 12.8 oz (88.4 kg)  10/03/22 196 lb 11.2 oz (89.2 kg)  08/14/22 193 lb (87.5 kg)    Physical Exam Vitals and nursing note reviewed.  Constitutional:      General: She is not in acute distress.    Appearance: She is not ill-appearing.  HENT:     Head: Normocephalic.     Right Ear: Hearing normal.     Left Ear: Hearing normal.     Nose: Nose normal.  Pulmonary:     Effort: Pulmonary effort is normal. No respiratory distress.  Neurological:     Mental Status: She is alert.  Psychiatric:        Mood  and Affect: Mood normal.        Behavior: Behavior normal.        Thought Content: Thought content normal.        Judgment: Judgment normal.     Results for orders placed or performed in visit on 10/03/22  Microscopic Examination   BLD  Result Value Ref Range   WBC, UA 0-5 0 - 5 /hpf   RBC, Urine 0-2 0 - 2 /hpf   Epithelial Cells (non renal) 0-10 0 - 10 /hpf   Mucus, UA Present (A) Not Estab.   Bacteria, UA None seen None seen/Few  VITAMIN D 25 Hydroxy (Vit-D Deficiency, Fractures)  Result Value Ref Range   Vit D, 25-Hydroxy 34.0 30.0 - 100.0 ng/mL  Uric acid  Result Value Ref Range   Uric Acid 5.8 2.6 - 6.2 mg/dL  B12  Result Value Ref Range   Vitamin B-12 1,034 232 - 1,245 pg/mL  CBC with Differential/Platelet  Result Value Ref Range   WBC 7.5 3.4 - 10.8 x10E3/uL   RBC 4.32 3.77 - 5.28 x10E6/uL   Hemoglobin 12.8 11.1 - 15.9 g/dL   Hematocrit 38.4 34.0 - 46.6 %   MCV 89 79 - 97 fL   MCH 29.6 26.6 - 33.0 pg   MCHC 33.3 31.5 - 35.7 g/dL   RDW 13.7 11.7 - 15.4 %  Platelets 299 150 - 450 x10E3/uL   Neutrophils 53 Not Estab. %   Lymphs 35 Not Estab. %   Monocytes 7 Not Estab. %   Eos 5 Not Estab. %   Basos 0 Not Estab. %   Neutrophils Absolute 4.0 1.4 - 7.0 x10E3/uL   Lymphocytes Absolute 2.6 0.7 - 3.1 x10E3/uL   Monocytes Absolute 0.6 0.1 - 0.9 x10E3/uL   EOS (ABSOLUTE) 0.4 0.0 - 0.4 x10E3/uL   Basophils Absolute 0.0 0.0 - 0.2 x10E3/uL   Immature Granulocytes 0 Not Estab. %   Immature Grans (Abs) 0.0 0.0 - 0.1 x10E3/uL  Comprehensive metabolic panel  Result Value Ref Range   Glucose 80 70 - 99 mg/dL   BUN 11 6 - 24 mg/dL   Creatinine, Ser 0.82 0.57 - 1.00 mg/dL   eGFR 87 >59 mL/min/1.73   BUN/Creatinine Ratio 13 9 - 23   Sodium 143 134 - 144 mmol/L   Potassium 3.5 3.5 - 5.2 mmol/L   Chloride 99 96 - 106 mmol/L   CO2 30 (H) 20 - 29 mmol/L   Calcium 9.6 8.7 - 10.2 mg/dL   Total Protein 7.5 6.0 - 8.5 g/dL   Albumin 4.6 3.9 - 4.9 g/dL   Globulin, Total 2.9 1.5  - 4.5 g/dL   Albumin/Globulin Ratio 1.6 1.2 - 2.2   Bilirubin Total 0.4 0.0 - 1.2 mg/dL   Alkaline Phosphatase 88 44 - 121 IU/L   AST 27 0 - 40 IU/L   ALT 20 0 - 32 IU/L  Lipid Panel w/o Chol/HDL Ratio  Result Value Ref Range   Cholesterol, Total 208 (H) 100 - 199 mg/dL   Triglycerides 227 (H) 0 - 149 mg/dL   HDL 45 >39 mg/dL   VLDL Cholesterol Cal 40 5 - 40 mg/dL   LDL Chol Calc (NIH) 123 (H) 0 - 99 mg/dL  Urinalysis, Routine w reflex microscopic  Result Value Ref Range   Specific Gravity, UA 1.020 1.005 - 1.030   pH, UA 7.0 5.0 - 7.5   Color, UA Yellow Yellow   Appearance Ur Clear Clear   Leukocytes,UA 1+ (A) Negative   Protein,UA Negative Negative/Trace   Glucose, UA Negative Negative   Ketones, UA Negative Negative   RBC, UA Trace (A) Negative   Bilirubin, UA Negative Negative   Urobilinogen, Ur 1.0 0.2 - 1.0 mg/dL   Nitrite, UA Negative Negative   Microscopic Examination See below:   TSH  Result Value Ref Range   TSH 1.820 0.450 - 4.500 uIU/mL  Microalbumin, Urine Waived  Result Value Ref Range   Microalb, Ur Waived 30 (H) 0 - 19 mg/L   Creatinine, Urine Waived 50 10 - 300 mg/dL   Microalb/Creat Ratio 30-300 (H) <30 mg/g  Bayer DCA Hb A1c Waived  Result Value Ref Range   HB A1C (BAYER DCA - WAIVED) 5.7 (H) 4.8 - 5.6 %  Ferritin  Result Value Ref Range   Ferritin 216 (H) 15 - 150 ng/mL      Assessment & Plan:   Problem List Items Addressed This Visit   None Visit Diagnoses     Acute bacterial conjunctivitis of both eyes    -  Primary   Complete course of Ciprofloxacin drops. Continue with cold compress.  Follow up if not improved.        Follow up plan: No follow-ups on file.   This visit was completed via MyChart due to the restrictions of the COVID-19 pandemic. All issues as above  were discussed and addressed. Physical exam was done as above through visual confirmation on MyChart. If it was felt that the patient should be evaluated in the office, they  were directed there. The patient verbally consented to this visit. Location of the patient: Home Location of the provider: Office Those involved with this call:  Provider: [name], NP CMA: Yvonna Alanis, CMA Front Desk/Registration: Lynnell Catalan This encounter was conducted via video.  I spent 20 dedicated to the care of this patient on the date of this encounter to include previsit review of symptoms, plan of care, and follow up, face to face time with the patient, and post visit ordering of testing.

## 2022-12-10 NOTE — Telephone Encounter (Signed)
  Chief Complaint: Pink eye Symptoms: Crusty swollen light sensitive. Red sclera Frequency: 2 days ago Pertinent Negatives: Patient denies fever Disposition: [] ED /[] Urgent Care (no appt availability in office) / [x] Appointment(In office/virtual)/ []  [name]/ [] Home Care/ [x] Refused Recommended Disposition /[] [facility]/ []  Follow-up with PCP Additional Notes: PT called with red swollen painful light sensitive eyes. Both eyes are affected. Pt  would like provider to call in medication for pink eye.   Made virtual appt with [name] for this afternoon.  Pt would really rather have provider call in medication.   Please advise.    Summary: pink eye   Pt called saying she has pink eye.  She wants to know if dr. [name] or someone can call in something at the pharmacy.  She does not want to come in.  She uses [facility]     Reason for Disposition  Eyelid is red and painful (or tender to touch)  Answer Assessment - Initial Assessment Questions 1. LOCATION: Location: "What's red, the eyeball or the outer eyelids?" (Note: when callers say the eye is red, they usually mean the sclera is red)       White,  2. REDNESS OF SCLERA: "Is the redness in one or both eyes?" "When did the redness start?"      Both 3. ONSET: "When did the eye become red?" (e.g., hours, days)      2 days ago 4. EYELIDS: "Are the eyelids red or swollen?" If Yes, ask: "How much?"      Swollen and a little red 5. VISION: "Is there any difficulty seeing clearly?"      Yes - very light sensitive 6. ITCHING: "Does it feel itchy?" If so ask: "How bad is it" (e.g., Scale 1-10; or mild, moderate, severe)     itchy 7. PAIN: "Is there any pain? If Yes, ask: "How bad is it?" (e.g., Scale 1-10; or mild, moderate, severe)     7/10 8. CONTACT LENS: "Do you wear contacts?"     no 9. CAUSE: "What do you think is causing the redness?"     Pink eye 10. OTHER  SYMPTOMS: "Do you have any other symptoms?" (e.g., fever, runny nose, cough, vomiting)       no  Protocols used: Eye - Red Without Pus-A-AH

## 2022-12-12 ENCOUNTER — Other Ambulatory Visit: Payer: Self-pay | Admitting: *Deleted

## 2022-12-12 DIAGNOSIS — E611 Iron deficiency: Secondary | ICD-10-CM

## 2022-12-13 ENCOUNTER — Inpatient Hospital Stay: Payer: No Typology Code available for payment source | Attending: Internal Medicine

## 2022-12-13 ENCOUNTER — Inpatient Hospital Stay: Payer: No Typology Code available for payment source

## 2022-12-13 ENCOUNTER — Inpatient Hospital Stay (HOSPITAL_BASED_OUTPATIENT_CLINIC_OR_DEPARTMENT_OTHER): Payer: No Typology Code available for payment source | Admitting: Internal Medicine

## 2022-12-13 ENCOUNTER — Encounter: Payer: Self-pay | Admitting: Internal Medicine

## 2022-12-13 VITALS — BP 117/85 | HR 66

## 2022-12-13 VITALS — BP 105/75 | HR 82 | Temp 97.7°F | Resp 16 | Wt 191.7 lb

## 2022-12-13 DIAGNOSIS — Z8541 Personal history of malignant neoplasm of cervix uteri: Secondary | ICD-10-CM | POA: Diagnosis not present

## 2022-12-13 DIAGNOSIS — D508 Other iron deficiency anemias: Secondary | ICD-10-CM | POA: Insufficient documentation

## 2022-12-13 DIAGNOSIS — I10 Essential (primary) hypertension: Secondary | ICD-10-CM | POA: Diagnosis not present

## 2022-12-13 DIAGNOSIS — Z90710 Acquired absence of both cervix and uterus: Secondary | ICD-10-CM | POA: Diagnosis not present

## 2022-12-13 DIAGNOSIS — Z9884 Bariatric surgery status: Secondary | ICD-10-CM | POA: Diagnosis not present

## 2022-12-13 DIAGNOSIS — E876 Hypokalemia: Secondary | ICD-10-CM | POA: Diagnosis not present

## 2022-12-13 DIAGNOSIS — E538 Deficiency of other specified B group vitamins: Secondary | ICD-10-CM | POA: Diagnosis not present

## 2022-12-13 DIAGNOSIS — E611 Iron deficiency: Secondary | ICD-10-CM

## 2022-12-13 DIAGNOSIS — R5383 Other fatigue: Secondary | ICD-10-CM | POA: Insufficient documentation

## 2022-12-13 DIAGNOSIS — Z803 Family history of malignant neoplasm of breast: Secondary | ICD-10-CM | POA: Insufficient documentation

## 2022-12-13 DIAGNOSIS — Z87891 Personal history of nicotine dependence: Secondary | ICD-10-CM | POA: Diagnosis not present

## 2022-12-13 LAB — BASIC METABOLIC PANEL - CANCER CENTER ONLY
Anion gap: 7 (ref 5–15)
BUN: 13 mg/dL (ref 6–20)
CO2: 29 mmol/L (ref 22–32)
Calcium: 9.3 mg/dL (ref 8.9–10.3)
Chloride: 101 mmol/L (ref 98–111)
Creatinine: 0.97 mg/dL (ref 0.44–1.00)
GFR, Estimated: >60 mL/min (ref >60)
Glucose, Bld: 140 mg/dL — ABNORMAL HIGH (ref 70–99)
Potassium: 3.1 mmol/L — ABNORMAL LOW (ref 3.5–5.1)
Sodium: 137 mmol/L (ref 135–145)

## 2022-12-13 LAB — CBC WITH DIFFERENTIAL (CANCER CENTER ONLY)
Abs Immature Granulocytes: 0.03 10*3/uL (ref 0.00–0.07)
Basophils Absolute: 0 10*3/uL (ref 0.0–0.1)
Basophils Relative: 1 %
Eosinophils Absolute: 0.3 10*3/uL (ref 0.0–0.5)
Eosinophils Relative: 4 %
HCT: 39.6 % (ref 36.0–46.0)
Hemoglobin: 13 g/dL (ref 12.0–15.0)
Immature Granulocytes: 0 %
Lymphocytes Relative: 29 %
Lymphs Abs: 2.5 10*3/uL (ref 0.7–4.0)
MCH: 28.8 pg (ref 26.0–34.0)
MCHC: 32.8 g/dL (ref 30.0–36.0)
MCV: 87.8 fL (ref 80.0–100.0)
Monocytes Absolute: 0.6 10*3/uL (ref 0.1–1.0)
Monocytes Relative: 7 %
Neutro Abs: 5.1 10*3/uL (ref 1.7–7.7)
Neutrophils Relative %: 59 %
Platelet Count: 272 10*3/uL (ref 150–400)
RBC: 4.51 MIL/uL (ref 3.87–5.11)
RDW: 12.9 % (ref 11.5–15.5)
WBC Count: 8.6 10*3/uL (ref 4.0–10.5)
nRBC: 0 % (ref 0.0–0.2)

## 2022-12-13 LAB — IRON AND TIBC
Iron: 94 ug/dL (ref 28–170)
Saturation Ratios: 26 % (ref 10.4–31.8)
TIBC: 368 ug/dL (ref 250–450)
UIBC: 274 ug/dL

## 2022-12-13 LAB — VITAMIN B12: Vitamin B-12: 1222 pg/mL — ABNORMAL HIGH (ref 180–914)

## 2022-12-13 LAB — FERRITIN: Ferritin: 118 ng/mL (ref 11–307)

## 2022-12-13 MED ORDER — ONDANSETRON HCL 4 MG/2ML IJ SOLN
4.0000 mg | Freq: Once | INTRAMUSCULAR | Status: AC
  Administered 2022-12-13: 4 mg via INTRAVENOUS
  Filled 2022-12-13: qty 2

## 2022-12-13 MED ORDER — SODIUM CHLORIDE 0.9 % IV SOLN
Freq: Once | INTRAVENOUS | Status: AC
  Filled 2022-12-13: qty 250

## 2022-12-13 MED ORDER — SODIUM CHLORIDE 0.9 % IV SOLN
200.0000 mg | Freq: Once | INTRAVENOUS | Status: AC
  Administered 2022-12-13: 200 mg via INTRAVENOUS
  Filled 2022-12-13: qty 200

## 2022-12-13 NOTE — Progress Notes (Signed)
Patient declined to wait the 30 minutes for post iron infusion observation today.  Post iron education done. Patient verbalized understanding. 
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
## 2022-12-13 NOTE — Assessment & Plan Note (Addendum)
#  Severe iron deficiency-mild anemia; secondary to bariatric surgery.  Poor tolerance/response to oral iron. Currently on  barimelt+ iron [since 2015].    #Hemoglobin improved- 12.3 today.  NOV 2023- ron saturation 25% ferritin 102. Proceed with IV iron infusion given the malabsorption.   # Chronic mild hypokalemia-monitor for now.  # B12 def- continue on home b12 injection.   # DISPOSITION: # venofer today # follow up in 6 months- MD; prior 2-3 days- labs- cbc/bmp; Iron studies; ferritin;b12 levels possible venofer- Dr.B

## 2022-12-13 NOTE — Progress Notes (Signed)
Patient feeling exhausted.  Took home Vitamin B12 inj 2 days ago.

## 2023-02-10 ENCOUNTER — Ambulatory Visit (INDEPENDENT_AMBULATORY_CARE_PROVIDER_SITE_OTHER): Payer: Self-pay | Admitting: Nurse Practitioner

## 2023-02-26 ENCOUNTER — Ambulatory Visit: Payer: No Typology Code available for payment source | Admitting: Family Medicine

## 2023-02-26 ENCOUNTER — Encounter: Payer: Self-pay | Admitting: Family Medicine

## 2023-02-26 VITALS — BP 120/80 | HR 96 | Temp 98.1°F | Ht 67.5 in | Wt 195.8 lb

## 2023-02-26 DIAGNOSIS — M5441 Lumbago with sciatica, right side: Secondary | ICD-10-CM | POA: Diagnosis not present

## 2023-02-26 MED ORDER — BACLOFEN 10 MG PO TABS: 10.0000 mg | ORAL_TABLET | Freq: Two times a day (BID) | ORAL | 1 refills | Status: DC

## 2023-02-26 MED ORDER — KETOROLAC TROMETHAMINE 60 MG/2ML IM SOLN
60.0000 mg | Freq: Once | INTRAMUSCULAR | Status: AC
  Administered 2023-02-26: 60 mg via INTRAMUSCULAR

## 2023-02-26 MED ORDER — PREDNISONE 10 MG PO TABS: ORAL_TABLET | ORAL | 0 refills | Status: DC

## 2023-02-26 NOTE — Progress Notes (Signed)
BP 120/80   Pulse 96   Temp 98.1 F (36.7 C) (Oral)   Ht 5' 7.5" (1.715 m)   Wt 195 lb 12.8 oz (88.8 kg)   SpO2 99%   BMI 30.21 kg/m    Subjective:    Patient ID: Tina Harvey, female    DOB: 29-Nov-1970, 52 y.o.   MRN: [id number]  HPI: Tina Harvey is a 52 y.o. female  Chief Complaint  Patient presents with   Spasms   Back Pain    Patient says about a week and a half ago, she woke up with back pain. Patient says it will just spasms whenever. Patient says she has been using warm and cold compress, Ibuprofen and muscle relaxer. Patient says she is no longer having any relief.    BACK PAIN Duration: 10 days ago Mechanism of injury: woke up like this Location: R side Onset: sudden Severity: severe Quality: shooting Frequency:  waxing and waning Radiation: R leg below the knee Aggravating factors: moving Alleviating factors: nothing Status: stable Treatments attempted: muscle relaxer, heat, ibuprofen,   Relief with NSAIDs?: mild Nighttime pain:  no Paresthesias / decreased sensation:  yes Bowel / bladder incontinence:  no Fevers:  no Dysuria / urinary frequency:  no  Relevant past medical, surgical, family and social history reviewed and updated as indicated. Interim medical history since our last visit reviewed. Allergies and medications reviewed and updated.  Review of Systems  Constitutional: Negative.   Respiratory: Negative.    Cardiovascular: Negative.   Gastrointestinal: Negative.   Musculoskeletal:  Positive for back pain and myalgias. Negative for arthralgias, gait problem, joint swelling, neck pain and neck stiffness.  Skin: Negative.   Neurological: Negative.   Psychiatric/Behavioral: Negative.      Per HPI unless specifically indicated above     Objective:    BP 120/80   Pulse 96   Temp 98.1 F (36.7 C) (Oral)   Ht 5' 7.5" (1.715 m)   Wt 195 lb 12.8 oz (88.8 kg)   SpO2 99%   BMI 30.21 kg/m   Wt Readings from Last 3 Encounters:  02/26/23  195 lb 12.8 oz (88.8 kg)  12/13/22 191 lb 11.2 oz (87 kg)  11/07/22 194 lb 12.8 oz (88.4 kg)    Physical Exam Vitals and nursing note reviewed.  Constitutional:      General: She is not in acute distress.    Appearance: Normal appearance. She is not ill-appearing, toxic-appearing or diaphoretic.  HENT:     Head: Normocephalic and atraumatic.     Right Ear: External ear normal.     Left Ear: External ear normal.     Nose: Nose normal.     Mouth/Throat:     Mouth: Mucous membranes are moist.     Pharynx: Oropharynx is clear.  Eyes:     General: No scleral icterus.       Right eye: No discharge.        Left eye: No discharge.     Extraocular Movements: Extraocular movements intact.     Conjunctiva/sclera: Conjunctivae normal.     Pupils: Pupils are equal, round, and reactive to light.  Cardiovascular:     Rate and Rhythm: Normal rate and regular rhythm.     Pulses: Normal pulses.     Heart sounds: Normal heart sounds. No murmur heard.    No friction rub. No gallop.  Pulmonary:     Effort: Pulmonary effort is normal. No respiratory distress.  Breath sounds: Normal breath sounds. No stridor. No wheezing, rhonchi or rales.  Chest:     Chest wall: No tenderness.  Musculoskeletal:        General: Tenderness (lumbar) present. Normal range of motion.     Cervical back: Normal range of motion and neck supple.  Skin:    General: Skin is warm and dry.     Capillary Refill: Capillary refill takes less than 2 seconds.     Coloration: Skin is not jaundiced or pale.     Findings: No bruising, erythema, lesion or rash.  Neurological:     General: No focal deficit present.     Mental Status: She is alert and oriented to person, place, and time. Mental status is at baseline.  Psychiatric:        Mood and Affect: Mood normal.        Behavior: Behavior normal.        Thought Content: Thought content normal.        Judgment: Judgment normal.     Results for orders placed or performed  in visit on 12/13/22  Vitamin B12  Result Value Ref Range   Vitamin B-12 1,222 (H) 180 - 914 pg/mL  Ferritin  Result Value Ref Range   Ferritin 118 11 - 307 ng/mL  Iron and TIBC  Result Value Ref Range   Iron 94 28 - 170 ug/dL   TIBC 161 096 - 045 ug/dL   Saturation Ratios 26 10.4 - 31.8 %   UIBC 274 ug/dL  Basic Metabolic Panel - Cancer Center Only  Result Value Ref Range   Sodium 137 135 - 145 mmol/L   Potassium 3.1 (L) 3.5 - 5.1 mmol/L   Chloride 101 98 - 111 mmol/L   CO2 29 22 - 32 mmol/L   Glucose, Bld 140 (H) 70 - 99 mg/dL   BUN 13 6 - 20 mg/dL   Creatinine 4.09 8.11 - 1.00 mg/dL   Calcium 9.3 8.9 - 91.4 mg/dL   GFR, Estimated >78 >29 mL/min   Anion gap 7 5 - 15  CBC with Differential (Cancer Center Only)  Result Value Ref Range   WBC Count 8.6 4.0 - 10.5 K/uL   RBC 4.51 3.87 - 5.11 MIL/uL   Hemoglobin 13.0 12.0 - 15.0 g/dL   HCT 56.2 13.0 - 86.5 %   MCV 87.8 80.0 - 100.0 fL   MCH 28.8 26.0 - 34.0 pg   MCHC 32.8 30.0 - 36.0 g/dL   RDW 78.4 69.6 - 29.5 %   Platelet Count 272 150 - 400 K/uL   nRBC 0.0 0.0 - 0.2 %   Neutrophils Relative % 59 %   Neutro Abs 5.1 1.7 - 7.7 K/uL   Lymphocytes Relative 29 %   Lymphs Abs 2.5 0.7 - 4.0 K/uL   Monocytes Relative 7 %   Monocytes Absolute 0.6 0.1 - 1.0 K/uL   Eosinophils Relative 4 %   Eosinophils Absolute 0.3 0.0 - 0.5 K/uL   Basophils Relative 1 %   Basophils Absolute 0.0 0.0 - 0.1 K/uL   Immature Granulocytes 0 %   Abs Immature Granulocytes 0.03 0.00 - 0.07 K/uL      Assessment & Plan:   Problem List Items Addressed This Visit   None Visit Diagnoses     Acute right-sided low back pain with right-sided sciatica    -  Primary   Will treat with toradol, prednisone, baclofen and stretches. Call if not getting better or getting  worse. Call with any concerns.   Relevant Medications   ketorolac (TORADOL) injection 60 mg (Completed)   predniSONE (DELTASONE) 10 MG tablet   baclofen (LIORESAL) 10 MG tablet         Follow up plan: Return if symptoms worsen or fail to improve.

## 2023-03-17 ENCOUNTER — Ambulatory Visit: Payer: Self-pay | Admitting: *Deleted

## 2023-03-17 ENCOUNTER — Other Ambulatory Visit: Payer: Self-pay | Admitting: Family Medicine

## 2023-03-17 DIAGNOSIS — M25561 Pain in right knee: Secondary | ICD-10-CM

## 2023-03-17 NOTE — Telephone Encounter (Signed)
  Chief Complaint: worsening right knee pain requesting xray  Symptoms: medial right knee pain limping some swelling comes and goes. Using compression and cool compresses with out relief.  Frequency: months  Pertinent Negatives: Patient denies chest pain no difficulty breathing no fever Disposition: [] ED /[] Urgent Care (no appt availability in office) / [x] Appointment(In office/virtual)/ []  [facility]/ [] Home Care/ [] Refused Recommended Disposition /[] [facility]/ []  Follow-up with PCP Additional Notes:   Appt tomorrow. Patient requesting if xray can be ordered prior to tomorrow and review results at visit tomorrow due to this has been seen by PCP before. Patient would like a call back .    Reason for Disposition  [1] MODERATE pain (e.g., interferes with normal activities, limping) AND [2] present > 3 days  Answer Assessment - Initial Assessment Questions 1. LOCATION and RADIATION: "Where is the pain located?"      Right knee pain  2. QUALITY: "What does the pain feel like?"  (e.g., sharp, dull, aching, burning)     Medial  sharp dull ache burning  3. SEVERITY: "How bad is the pain?" "What does it keep you from doing?"   (Scale 1-10; or mild, moderate, severe)   -  MILD (1-3): doesn't interfere with normal activities    -  MODERATE (4-7): interferes with normal activities (e.g., work or school) or awakens from sleep, limping    -  SEVERE (8-10): excruciating pain, unable to do any normal activities, unable to walk     Limping. Worsening pain conservative measures not working  4. ONSET: "When did the pain start?" "Does it come and go, or is it there all the time?"     Months ago  5. RECURRENT: "Have you had this pain before?" If Yes, ask: "When, and what happened then?"     Yes  6. SETTING: "Has there been any recent work, exercise or other activity that involved that part of the body?"      No  7. AGGRAVATING FACTORS: "What makes the knee pain worse?" (e.g.,  walking, climbing stairs, running)     Walking  8. ASSOCIATED SYMPTOMS: "Is there any swelling or redness of the knee?"    Some swelling now no redness  9. OTHER SYMPTOMS: "Do you have any other symptoms?" (e.g., chest pain, difficulty breathing, fever, calf pain)     no 10. PREGNANCY: "Is there any chance you are pregnant?" "When was your last menstrual period?"       na  Protocols used: Knee Pain-A-AH

## 2023-03-17 NOTE — Telephone Encounter (Signed)
Spoke with patient and made her aware of Dr Henriette Combs recommendations. Patient verbalized understanding and says she will just wait until her scheduled appointment.

## 2023-03-17 NOTE — Telephone Encounter (Signed)
I will put order in, but radiology has been very slow recently and there is a very good chance it will not be available for review tomorrow.

## 2023-03-18 ENCOUNTER — Encounter: Payer: Self-pay | Admitting: Family Medicine

## 2023-03-18 ENCOUNTER — Ambulatory Visit
Admission: RE | Admit: 2023-03-18 | Discharge: 2023-03-18 | Disposition: A | Discharge status: Home / Self Care | Payer: No Typology Code available for payment source | Source: Ambulatory Visit | Attending: Family Medicine | Admitting: Family Medicine

## 2023-03-18 ENCOUNTER — Ambulatory Visit
Admission: RE | Admit: 2023-03-18 | Discharge: 2023-03-18 | Disposition: A | Discharge status: Home / Self Care | Payer: No Typology Code available for payment source | Source: Home / Self Care | Attending: Family Medicine | Admitting: Family Medicine

## 2023-03-18 ENCOUNTER — Ambulatory Visit: Payer: No Typology Code available for payment source | Admitting: Family Medicine

## 2023-03-18 VITALS — BP 105/71 | HR 73 | Temp 98.2°F | Ht 67.5 in | Wt 194.6 lb

## 2023-03-18 DIAGNOSIS — M25561 Pain in right knee: Secondary | ICD-10-CM

## 2023-03-18 DIAGNOSIS — F419 Anxiety disorder, unspecified: Secondary | ICD-10-CM | POA: Diagnosis not present

## 2023-03-18 DIAGNOSIS — F32A Depression, unspecified: Secondary | ICD-10-CM

## 2023-03-18 LAB — URINALYSIS, ROUTINE W REFLEX MICROSCOPIC
Bilirubin, UA: NEGATIVE
Glucose, UA: NEGATIVE
Nitrite, UA: NEGATIVE
Specific Gravity, UA: 1.02 (ref 1.005–1.030)
Urobilinogen, Ur: 4 mg/dL — ABNORMAL HIGH (ref 0.2–1.0)
pH, UA: 6 (ref 5.0–7.5)

## 2023-03-18 LAB — MICROSCOPIC EXAMINATION: Bacteria, UA: NONE SEEN

## 2023-03-18 MED ORDER — ESCITALOPRAM OXALATE 20 MG PO TABS: 20.0000 mg | ORAL_TABLET | Freq: Every day | ORAL | 3 refills | Status: DC

## 2023-03-18 MED ORDER — BUSPIRONE HCL 5 MG PO TABS: 5.0000 mg | ORAL_TABLET | Freq: Three times a day (TID) | ORAL | 6 refills | Status: DC

## 2023-03-18 NOTE — Assessment & Plan Note (Signed)
Acting up with father in law being on hospice. Will increase her lexapro to 20mg  and recheck in about 6 weeks. Call with any concerns. Continue to monitor.

## 2023-03-18 NOTE — Progress Notes (Signed)
BP 105/71   Pulse 73   Temp 98.2 F (36.8 C) (Oral)   Ht 5' 7.5" (1.715 m)   Wt 194 lb 9.6 oz (88.3 kg)   SpO2 99%   BMI 30.03 kg/m    Subjective:    Patient ID: Tina Harvey, female    DOB: 03-Feb-1971, 52 y.o.   MRN: [id number]  HPI: Tina Harvey is a 52 y.o. female  Chief Complaint  Patient presents with   Knee Pain    Patient says her knee pain has been an ongoing issue, as she thinks she may have pulled or torn something within the knee. Patient says she notices some heat in the R knee. Patient says it is interfering with her every life.    KNEE PAIN Duration: months, but worse Involved knee: right Mechanism of injury: unknown Location:medial Onset: sudden Severity: severe  Quality:  sharp Frequency: constant Radiation: down the leg Aggravating factors: weight bearing, walking, running, stairs, and bending  Alleviating factors: nothing  Status: worse Treatments attempted: rest, ice, heat, and APAP  Relief with NSAIDs?:  No NSAIDs Taken Weakness with weight bearing or walking: yes Sensation of giving way: yes Locking: no Popping: no Bruising: no Swelling: no Redness: no Paresthesias/decreased sensation: no Fevers: no  ANXIETY/DEPRESSED Duration: chronic Status:exacerbated Anxious mood: yes  Excessive worrying: yes Irritability: no  Sweating: no Nausea: no Palpitations:no Hyperventilation: no Panic attacks: no Agoraphobia: no  Obscessions/compulsions: no Depressed mood: yes    08/07/2022    9:22 AM 06/13/2022    8:47 AM 04/01/2022    8:54 AM 12/20/2021    3:05 PM 09/18/2021    9:52 AM  Depression screen PHQ 2/9  Decreased Interest 0 0 0 0 0  Down, Depressed, Hopeless 0 0 0 0 0  PHQ - 2 Score 0 0 0 0 0  Altered sleeping 3  0 2 0  Tired, decreased energy 3  0 3 0  Change in appetite 0  0 3 0  Feeling bad or failure about yourself  0  0 0 0  Trouble concentrating 0  0 2 0  Moving slowly or fidgety/restless 0  0 0 0  Suicidal thoughts 0  0 0 0   PHQ-9 Score 6  0 10 0  Difficult doing work/chores Very difficult  Not difficult at all  Not difficult at all   Anhedonia: no Weight changes: no Insomnia: no   Hypersomnia: no Fatigue/loss of energy: yes Feelings of worthlessness: yes Feelings of guilt: yes Impaired concentration/indecisiveness: no Suicidal ideations: no  Crying spells: no Recent Stressors/Life Changes: yes   Relationship problems: no   Family stress: yes     Financial stress: no    Job stress: no    Recent death/loss: no   Relevant past medical, surgical, family and social history reviewed and updated as indicated. Interim medical history since our last visit reviewed. Allergies and medications reviewed and updated.  Review of Systems  Constitutional: Negative.   Respiratory: Negative.    Cardiovascular: Negative.   Gastrointestinal: Negative.   Musculoskeletal:  Positive for arthralgias and joint swelling. Negative for back pain, gait problem, myalgias, neck pain and neck stiffness.  Skin: Negative.   Psychiatric/Behavioral:  Positive for dysphoric mood and sleep disturbance. Negative for agitation, behavioral problems, confusion, decreased concentration, hallucinations, self-injury and suicidal ideas. The patient is nervous/anxious. The patient is not hyperactive.     Per HPI unless specifically indicated above     Objective:  BP 105/71   Pulse 73   Temp 98.2 F (36.8 C) (Oral)   Ht 5' 7.5" (1.715 m)   Wt 194 lb 9.6 oz (88.3 kg)   SpO2 99%   BMI 30.03 kg/m   Wt Readings from Last 3 Encounters:  03/18/23 194 lb 9.6 oz (88.3 kg)  02/26/23 195 lb 12.8 oz (88.8 kg)  12/13/22 191 lb 11.2 oz (87 kg)    Physical Exam Vitals and nursing note reviewed.  Constitutional:      General: She is not in acute distress.    Appearance: Normal appearance. She is not ill-appearing, toxic-appearing or diaphoretic.  HENT:     Head: Normocephalic and atraumatic.     Right Ear: External ear normal.      Left Ear: External ear normal.     Nose: Nose normal.     Mouth/Throat:     Mouth: Mucous membranes are moist.     Pharynx: Oropharynx is clear.  Eyes:     General: No scleral icterus.       Right eye: No discharge.        Left eye: No discharge.     Extraocular Movements: Extraocular movements intact.     Conjunctiva/sclera: Conjunctivae normal.     Pupils: Pupils are equal, round, and reactive to light.  Cardiovascular:     Rate and Rhythm: Normal rate and regular rhythm.     Pulses: Normal pulses.     Heart sounds: Normal heart sounds. No murmur heard.    No friction rub. No gallop.  Pulmonary:     Effort: Pulmonary effort is normal. No respiratory distress.     Breath sounds: Normal breath sounds. No stridor. No wheezing, rhonchi or rales.  Chest:     Chest wall: No tenderness.  Musculoskeletal:        General: Normal range of motion.     Cervical back: Normal range of motion and neck supple.  Skin:    General: Skin is warm and dry.     Capillary Refill: Capillary refill takes less than 2 seconds.     Coloration: Skin is not jaundiced or pale.     Findings: No bruising, erythema, lesion or rash.  Neurological:     General: No focal deficit present.     Mental Status: She is alert and oriented to person, place, and time. Mental status is at baseline.  Psychiatric:        Mood and Affect: Mood normal.        Behavior: Behavior normal.        Thought Content: Thought content normal.        Judgment: Judgment normal.     Results for orders placed or performed in visit on 03/18/23  Microscopic Examination   Urine  Result Value Ref Range   WBC, UA 0-5 0 - 5 /hpf   RBC, Urine 0-2 0 - 2 /hpf   Epithelial Cells (non renal) 0-10 0 - 10 /hpf   Mucus, UA Present (A) Not Estab.   Bacteria, UA None seen None seen/Few  Urinalysis, Routine w reflex microscopic  Result Value Ref Range   Specific Gravity, UA 1.020 1.005 - 1.030   pH, UA 6.0 5.0 - 7.5   Color, UA Yellow Yellow    Appearance Ur Clear Clear   Leukocytes,UA 1+ (A) Negative   Protein,UA 1+ (A) Negative/Trace   Glucose, UA Negative Negative   Ketones, UA Trace (A) Negative   RBC, UA Trace (A)  Negative   Bilirubin, UA Negative Negative   Urobilinogen, Ur 4.0 (H) 0.2 - 1.0 mg/dL   Nitrite, UA Negative Negative   Microscopic Examination See below:       Assessment & Plan:   Problem List Items Addressed This Visit       Other   Anxiety and depression    Acting up with father in law being on hospice. Will increase her lexapro to 20mg  and recheck in about 6 weeks. Call with any concerns. Continue to monitor.       Relevant Medications   escitalopram (LEXAPRO) 20 MG tablet   busPIRone (BUSPAR) 5 MG tablet   Other Visit Diagnoses     Acute pain of right knee    -  Primary   Will check x-rays. Await results. Treat as needed.   Relevant Orders   Urinalysis, Routine w reflex microscopic (Completed)   Comprehensive metabolic panel        Follow up plan: Return in about 6 weeks (around 04/29/2023).

## 2023-03-19 LAB — COMPREHENSIVE METABOLIC PANEL
ALT: 20 IU/L (ref 0–32)
AST: 17 IU/L (ref 0–40)
Albumin: 4 g/dL (ref 3.9–4.9)
Alkaline Phosphatase: 83 IU/L (ref 44–121)
BUN/Creatinine Ratio: 17 (ref 9–23)
BUN: 13 mg/dL (ref 6–24)
Bilirubin Total: 0.4 mg/dL (ref 0.0–1.2)
CO2: 30 mmol/L — ABNORMAL HIGH (ref 20–29)
Calcium: 9.8 mg/dL (ref 8.7–10.2)
Chloride: 100 mmol/L (ref 96–106)
Creatinine, Ser: 0.78 mg/dL (ref 0.57–1.00)
Globulin, Total: 2.9 g/dL (ref 1.5–4.5)
Glucose: 96 mg/dL (ref 70–99)
Potassium: 3.5 mmol/L (ref 3.5–5.2)
Sodium: 143 mmol/L (ref 134–144)
Total Protein: 6.9 g/dL (ref 6.0–8.5)
eGFR: 92 mL/min/{1.73_m2} (ref >59)

## 2023-03-20 ENCOUNTER — Other Ambulatory Visit: Payer: Self-pay | Admitting: Family Medicine

## 2023-03-20 MED ORDER — DICLOFENAC SODIUM 1 % EX GEL: 4.0000 g | Freq: Four times a day (QID) | CUTANEOUS | 1 refills | Status: DC

## 2023-03-22 ENCOUNTER — Other Ambulatory Visit: Payer: Self-pay | Admitting: Family Medicine

## 2023-03-24 MED ORDER — VITAMIN D (ERGOCALCIFEROL) 1.25 MG (50000 UNIT) PO CAPS: 50000.0000 [IU] | ORAL_CAPSULE | ORAL | 0 refills | Status: DC

## 2023-03-29 ENCOUNTER — Other Ambulatory Visit: Payer: Self-pay | Admitting: Family Medicine

## 2023-03-31 ENCOUNTER — Other Ambulatory Visit: Payer: Self-pay | Admitting: Family Medicine

## 2023-03-31 MED ORDER — SUCRALFATE 1 GM/10ML PO SUSP: 1.0000 g | Freq: Three times a day (TID) | ORAL | 3 refills | Status: DC

## 2023-03-31 NOTE — Telephone Encounter (Signed)
Requested Prescriptions  Pending Prescriptions Disp Refills   atorvastatin (LIPITOR) 10 MG tablet [Pharmacy Med Name: Atorvastatin Calcium 10 MG Oral Tablet] 90 tablet 0    Sig: Take 1 tablet by mouth once daily     Cardiovascular:  Antilipid - Statins Failed - 03/29/2023  3:02 PM      Failed - Lipid Panel in normal range within the last 12 months    Cholesterol, Total  Date Value Ref Range Status  10/03/2022 208 (H) 100 - 199 mg/dL Final   LDL Chol Calc (NIH)  Date Value Ref Range Status  10/03/2022 123 (H) 0 - 99 mg/dL Final   HDL  Date Value Ref Range Status  10/03/2022 45 >39 mg/dL Final   Triglycerides  Date Value Ref Range Status  10/03/2022 227 (H) 0 - 149 mg/dL Final         Passed - Patient is not pregnant      Passed - Valid encounter within last 12 months    Recent Outpatient Visits           1 week ago Acute pain of right knee   [place] [facility], Megan P, DO   1 month ago Acute right-sided low back pain with right-sided sciatica   [place] [facility], Megan P, DO   3 months ago Acute bacterial conjunctivitis of both eyes   [place] [facility] Larae Grooms, NP   5 months ago Routine general medical examination at a health care facility   [facility], Megan P, DO   7 months ago Acute cystitis with hematuria   [place] [facility] Dorcas Carrow, DO       Future Appointments             In 2 weeks Laural Benes, Oralia Rud, DO [place] [facility]

## 2023-04-04 ENCOUNTER — Ambulatory Visit: Payer: No Typology Code available for payment source | Admitting: Family Medicine

## 2023-04-14 ENCOUNTER — Ambulatory Visit: Payer: No Typology Code available for payment source | Admitting: Family Medicine

## 2023-04-15 ENCOUNTER — Telehealth: Payer: Self-pay | Admitting: Family Medicine

## 2023-04-15 NOTE — Telephone Encounter (Signed)
Copied from CRM [id number]. Topic: General - Other >> Apr 14, 2023  4:37 PM Tina Harvey F wrote: Reason for CRM: Pt is calling in to let Dr. Laural Benes know she missed her appointment today due to having a severe migraine.

## 2023-04-15 NOTE — Telephone Encounter (Signed)
FYI

## 2023-04-21 IMAGING — US US EXTREM LOW VENOUS
1 series · 13 of 24 positions shown · non-contrast
Comparison: None.

CLINICAL DATA: Bilateral lower extremity pain, elevated D-dimer,
recent prolonged travel



[Series 1: us venous img lower bilat (dvt) · portal-venous · 13 of 62 slices shown]
[im 1/62]
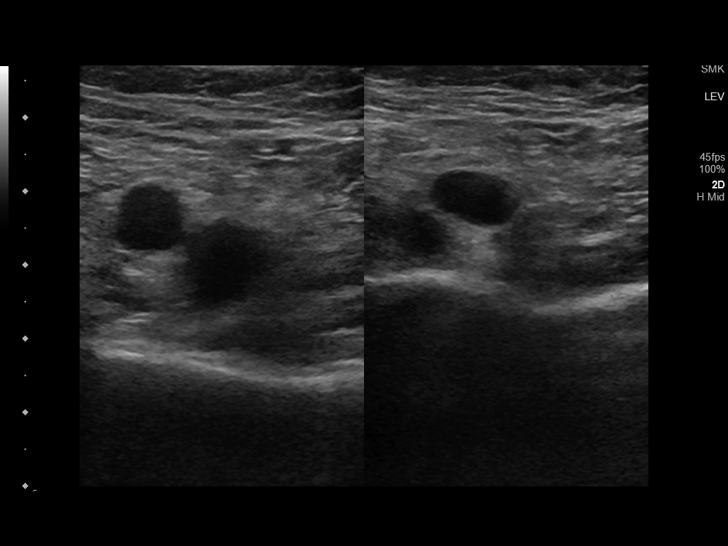
[im 6/62]
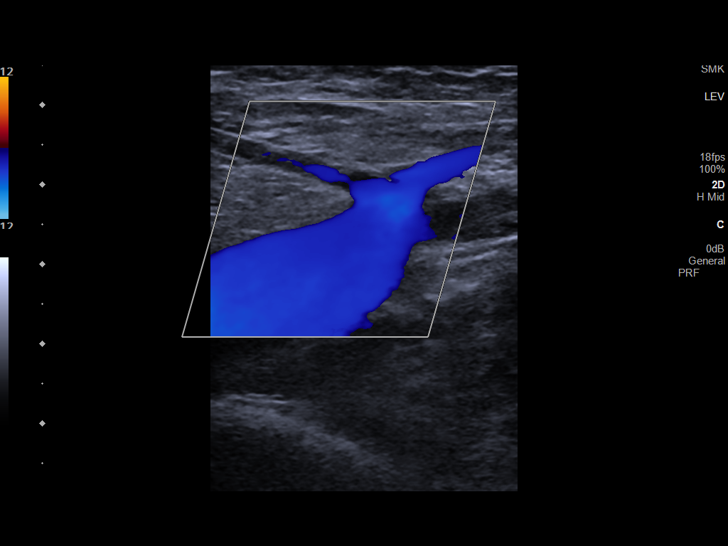
[im 11/62]
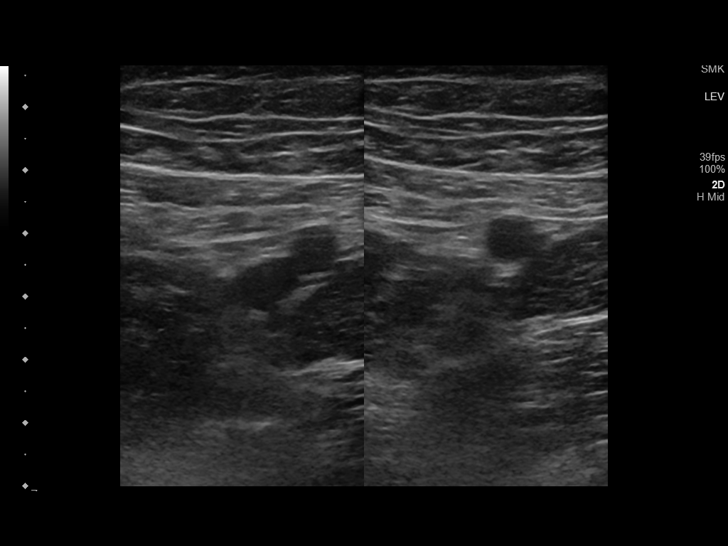
[im 16/62]
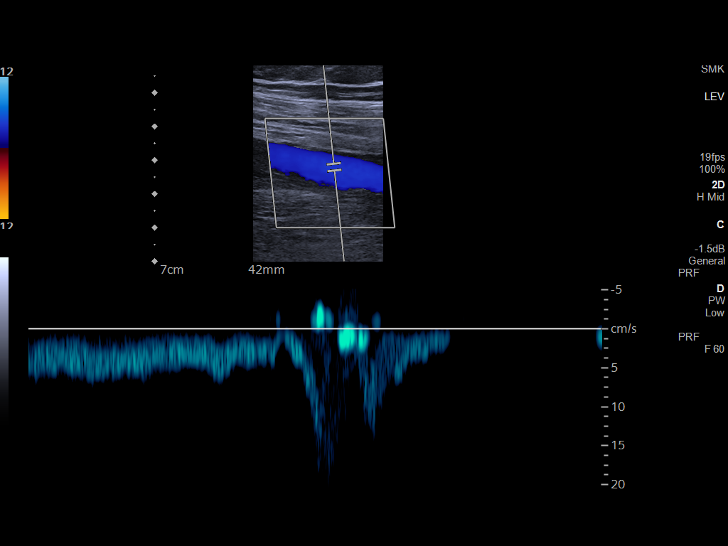
[im 22/62]
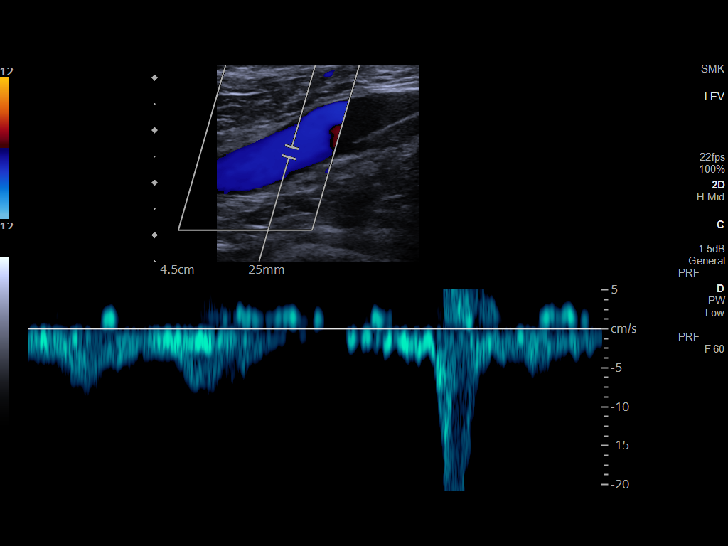
[im 27/62]
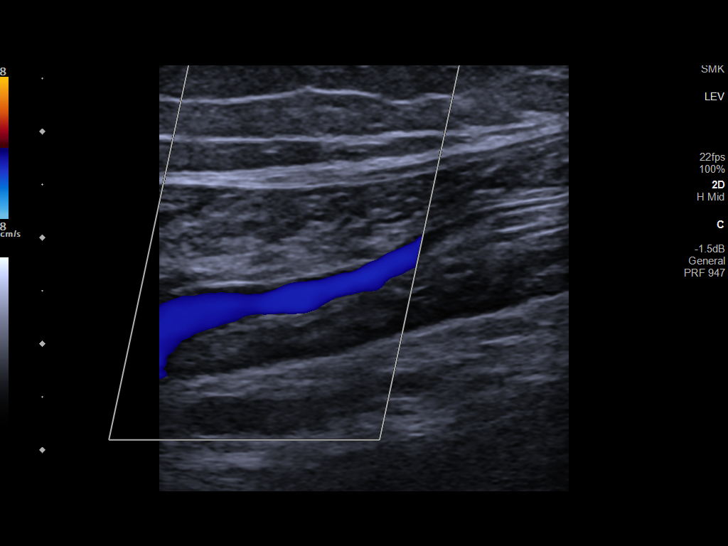
[im 32/62]
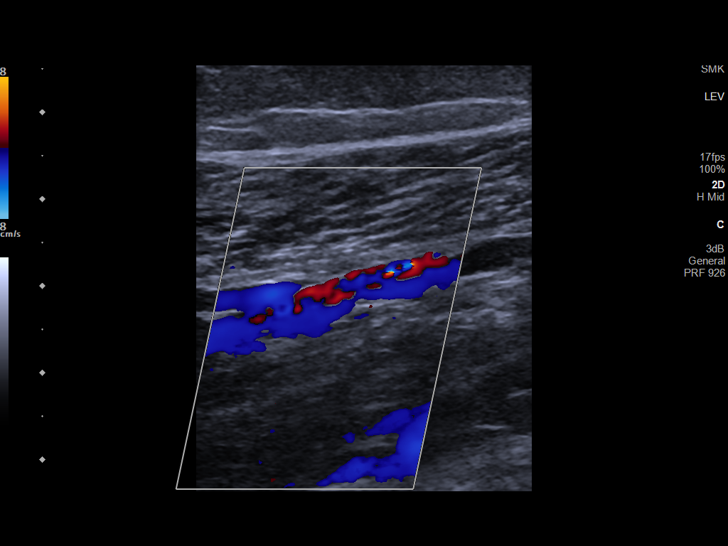
[im 35/62]
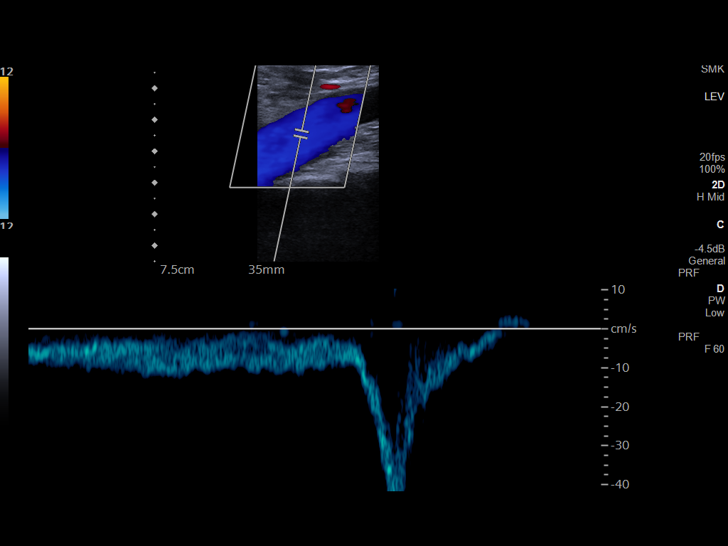
[im 40/62]
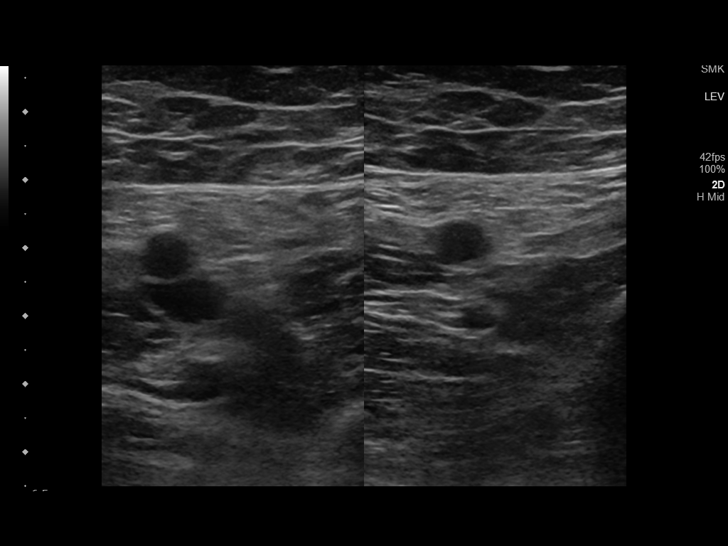
[im 46/62]
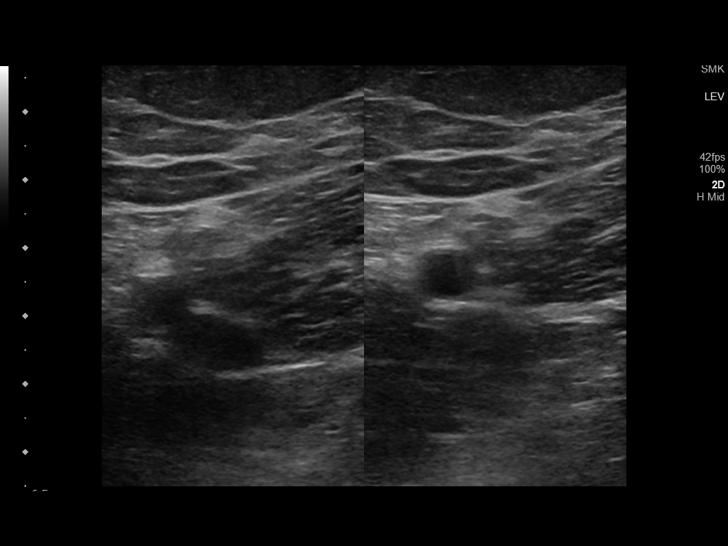
[im 51/62]
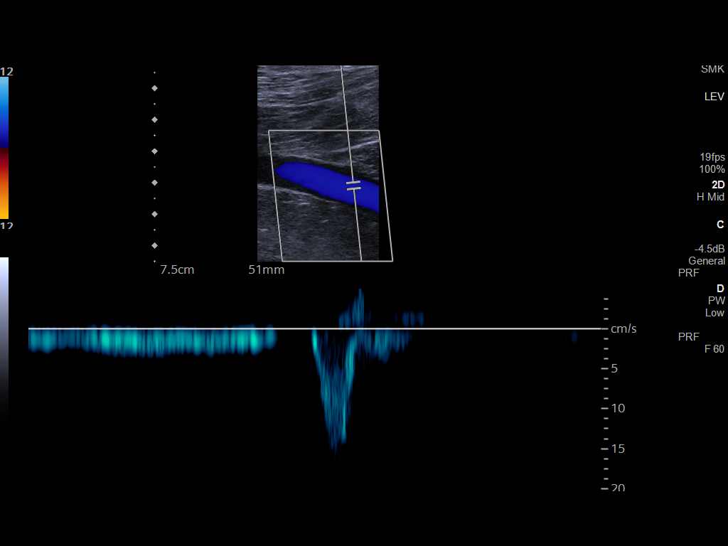
[im 56/62]
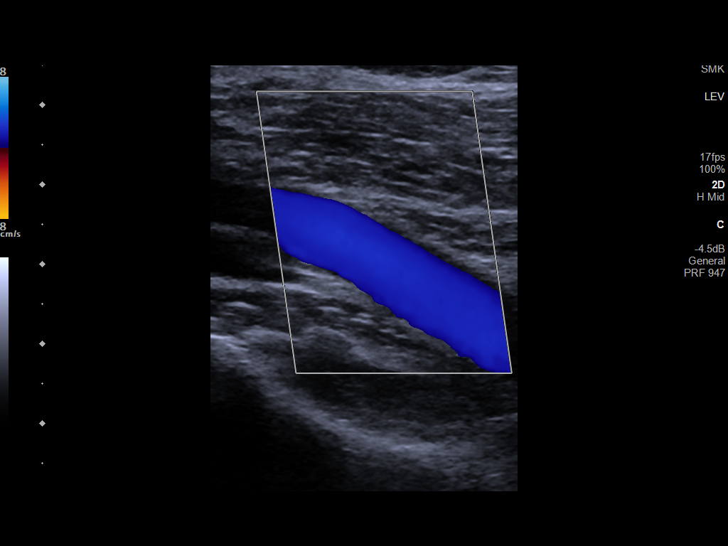
[im 62/62]
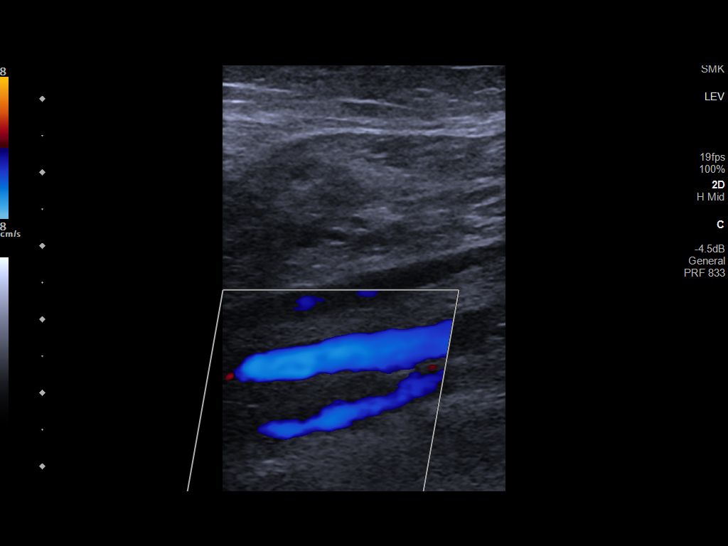

[13 of 24 positions shown; findings below may reference images not displayed]

FINDINGS: RIGHT LOWER EXTREMITY

Common Femoral Vein: No evidence of thrombus. Normal
compressibility, respiratory phasicity and response to augmentation.

Saphenofemoral Junction: No evidence of thrombus. Normal
compressibility and flow on color Doppler imaging.

Profunda Femoral Vein: No evidence of thrombus. Normal
compressibility and flow on color Doppler imaging.

Femoral Vein: No evidence of thrombus. Normal compressibility,
respiratory phasicity and response to augmentation.

Popliteal Vein: No evidence of thrombus. Normal compressibility,
respiratory phasicity and response to augmentation.

Calf Veins: No evidence of thrombus. Normal compressibility and flow
on color Doppler imaging.

LEFT LOWER EXTREMITY

Common Femoral Vein: No evidence of thrombus. Normal
compressibility, respiratory phasicity and response to augmentation.

Saphenofemoral Junction: No evidence of thrombus. Normal
compressibility and flow on color Doppler imaging.

Profunda Femoral Vein: No evidence of thrombus. Normal
compressibility and flow on color Doppler imaging.

Femoral Vein: No evidence of thrombus. Normal compressibility,
respiratory phasicity and response to augmentation.

Popliteal Vein: No evidence of thrombus. Normal compressibility,
respiratory phasicity and response to augmentation.

Calf Veins: No evidence of thrombus. Normal compressibility and flow
on color Doppler imaging.
IMPRESSION: No evidence of deep venous thrombosis in either lower extremity.

## 2023-04-25 ENCOUNTER — Other Ambulatory Visit: Payer: Self-pay | Admitting: Family Medicine

## 2023-04-25 NOTE — Telephone Encounter (Signed)
Requested Prescriptions  Pending Prescriptions Disp Refills   baclofen (LIORESAL) 10 MG tablet [Pharmacy Med Name: Baclofen 10 MG Oral Tablet] 60 tablet 0    Sig: Take 1 tablet by mouth twice daily     Analgesics:  Muscle Relaxants - baclofen Passed - 04/25/2023  9:14 AM      Passed - Cr in normal range and within 180 days    Creatinine  Date Value Ref Range Status  12/13/2022 0.97 0.44 - 1.00 mg/dL Final  46/96/2952 8.41 0.60 - 1.30 mg/dL Final   Creatinine, Ser  Date Value Ref Range Status  03/18/2023 0.78 0.57 - 1.00 mg/dL Final         Passed - eGFR is 30 or above and within 180 days    EGFR (African American)  Date Value Ref Range Status  10/10/2014 >60 >98mL/min Final  02/23/2014 >60  Final   GFR calc Af Amer  Date Value Ref Range Status  08/08/2020 91 >59 mL/min/1.73 Final    Comment:    **In accordance with recommendations from the NKF-ASN Task force,**   [facility] is in the process of updating its eGFR calculation to the   2021 CKD-EPI creatinine equation that estimates kidney function   without a race variable.    EGFR (Non-African Amer.)  Date Value Ref Range Status  10/10/2014 >60 >15mL/min Final    Comment:    eGFR values <31mL/min/1.73 m2 may be an indication of chronic kidney disease (CKD). Calculated eGFR, using the MRDR Study equation, is useful in  patients with stable renal function. The eGFR calculation will not be reliable in acutely ill patients when serum creatinine is changing rapidly. It is not useful in patients on dialysis. The eGFR calculation may not be applicable to patients at the low and high extremes of body sizes, pregnant women, and vegetarians.   02/23/2014 >60  Final    Comment:    eGFR values <19mL/min/1.73 m2 may be an indication of chronic kidney disease (CKD). Calculated eGFR is useful in patients with stable renal function. The eGFR calculation will not be reliable in acutely ill patients when serum creatinine is changing  rapidly. It is not useful in  patients on dialysis. The eGFR calculation may not be applicable to patients at the low and high extremes of body sizes, pregnant women, and vegetarians.    GFR, Estimated  Date Value Ref Range Status  12/13/2022 >60 >60 mL/min Final    Comment:    (NOTE) Calculated using the CKD-EPI Creatinine Equation (2021)    eGFR  Date Value Ref Range Status  03/18/2023 92 >59 mL/min/1.73 Final         Passed - Valid encounter within last 6 months    Recent Outpatient Visits           1 month ago Acute pain of right knee   [place] [facility] [place], Megan P, DO   1 month ago Acute right-sided low back pain with right-sided sciatica   [place] [facility] [place], Megan P, DO   4 months ago Acute bacterial conjunctivitis of both eyes   [place] [facility] Larae Grooms, NP   6 months ago Routine general medical examination at a health care facility   [facility], Megan P, DO   8 months ago Acute cystitis with hematuria   [place] [facility] Dorcas Carrow, DO       Future Appointments  In 3 weeks Laural Benes, Oralia Rud, DO [place] [facility]

## 2023-05-16 ENCOUNTER — Other Ambulatory Visit: Payer: Self-pay | Admitting: Family Medicine

## 2023-05-19 NOTE — Telephone Encounter (Signed)
Requested Prescriptions  Pending Prescriptions Disp Refills   pantoprazole (PROTONIX) 40 MG tablet [Pharmacy Med Name: Pantoprazole Sodium 40 MG Oral Tablet Delayed Release] 90 tablet 0    Sig: Take 1 tablet by mouth once daily     Gastroenterology: Proton Pump Inhibitors Passed - 05/16/2023 11:25 AM      Passed - Valid encounter within last 12 months    Recent Outpatient Visits           2 months ago Acute pain of right knee   [facility] [place], Megan P, DO   2 months ago Acute right-sided low back pain with right-sided sciatica   [place] [facility] [place], Megan P, DO   5 months ago Acute bacterial conjunctivitis of both eyes   [place] [facility] Larae Grooms, NP   7 months ago Routine general medical examination at a health care facility   [facility], Megan P, DO   9 months ago Acute cystitis with hematuria   [place] [facility] Dorcas Carrow, DO       Future Appointments             Tomorrow Dorcas Carrow, DO [place] [facility], [facility]

## 2023-05-20 ENCOUNTER — Encounter: Payer: Self-pay | Admitting: Family Medicine

## 2023-05-20 ENCOUNTER — Ambulatory Visit: Payer: No Typology Code available for payment source | Admitting: Family Medicine

## 2023-05-20 VITALS — BP 136/90 | HR 76 | Temp 98.3°F | Wt 194.0 lb

## 2023-05-20 DIAGNOSIS — F32A Depression, unspecified: Secondary | ICD-10-CM | POA: Diagnosis not present

## 2023-05-20 DIAGNOSIS — F419 Anxiety disorder, unspecified: Secondary | ICD-10-CM | POA: Diagnosis not present

## 2023-05-20 DIAGNOSIS — R41840 Attention and concentration deficit: Secondary | ICD-10-CM

## 2023-05-20 MED ORDER — LORAZEPAM 0.5 MG PO TABS: 0.5000 mg | ORAL_TABLET | Freq: Every day | ORAL | 1 refills | Status: DC | PRN

## 2023-05-20 NOTE — Assessment & Plan Note (Addendum)
Father in law is dying. Having a lot of stress. Stable on the 20mg . Continue current medicine. Will start her on PRN lorazepam. Call with any concerns.

## 2023-05-20 NOTE — Progress Notes (Signed)
BP (!) 136/90   Pulse 76   Temp 98.3 F (36.8 C) (Oral)   Wt 194 lb (88 kg)   SpO2 100%   BMI 29.94 kg/m    Subjective:    Patient ID: Tina Harvey, female    DOB: 07/28/1971, 52 y.o.   MRN: [id number]  HPI: Tina Harvey is a 52 y.o. female  Chief Complaint  Patient presents with   Anxiety   Depression   ANXIETY/STRESS Duration: chronic Status:exacerbated Anxious mood: yes  Excessive worrying: yes Irritability: no  Sweating: no Nausea: no Palpitations:no Hyperventilation: no Panic attacks: no Agoraphobia: no  Obscessions/compulsions: no Depressed mood: yes    08/07/2022    9:22 AM 06/13/2022    8:47 AM 04/01/2022    8:54 AM 12/20/2021    3:05 PM 09/18/2021    9:52 AM  Depression screen PHQ 2/9  Decreased Interest 0 0 0 0 0  Down, Depressed, Hopeless 0 0 0 0 0  PHQ - 2 Score 0 0 0 0 0  Altered sleeping 3  0 2 0  Tired, decreased energy 3  0 3 0  Change in appetite 0  0 3 0  Feeling bad or failure about yourself  0  0 0 0  Trouble concentrating 0  0 2 0  Moving slowly or fidgety/restless 0  0 0 0  Suicidal thoughts 0  0 0 0  PHQ-9 Score 6  0 10 0  Difficult doing work/chores Very difficult  Not difficult at all  Not difficult at all      08/07/2022    9:22 AM 04/01/2022    8:54 AM 12/20/2021    3:06 PM 09/18/2021    9:52 AM  GAD 7 : Generalized Anxiety Score  Nervous, Anxious, on Edge 0 0 0 0  Control/stop worrying 0 0 0 0  Worry too much - different things 0 0 0 0  Trouble relaxing 0 0 3 0  Restless 0 0 0 0  Easily annoyed or irritable 0 0 0 0  Afraid - awful might happen 0 0 0 0  Total GAD 7 Score 0 0 3 0  Anxiety Difficulty Not difficult at all Not difficult at all Not difficult at all Not difficult at all   Anhedonia: no Weight changes: no Insomnia: yes hard to fall asleep  Hypersomnia: no Fatigue/loss of energy: yes Feelings of worthlessness: yes Feelings of guilt: yes Impaired concentration/indecisiveness: yes Suicidal ideations: no   Crying spells: yes Recent Stressors/Life Changes: yes   Relationship problems: no   Family stress: yes     Financial stress: no    Job stress: no    Recent death/loss: no  Relevant past medical, surgical, family and social history reviewed and updated as indicated. Interim medical history since our last visit reviewed. Allergies and medications reviewed and updated.  Review of Systems  Constitutional: Negative.   Respiratory: Negative.    Cardiovascular: Negative.   Musculoskeletal: Negative.   Skin: Negative.   Psychiatric/Behavioral:  Positive for decreased concentration, dysphoric mood and sleep disturbance. Negative for agitation, behavioral problems, confusion, hallucinations, self-injury and suicidal ideas. The patient is not nervous/anxious and is not hyperactive.     Per HPI unless specifically indicated above     Objective:    BP (!) 136/90   Pulse 76   Temp 98.3 F (36.8 C) (Oral)   Wt 194 lb (88 kg)   SpO2 100%   BMI 29.94 kg/m   Wt Readings  from Last 3 Encounters:  05/20/23 194 lb (88 kg)  03/18/23 194 lb 9.6 oz (88.3 kg)  02/26/23 195 lb 12.8 oz (88.8 kg)    Physical Exam Vitals and nursing note reviewed.  Constitutional:      General: She is not in acute distress.    Appearance: Normal appearance. She is not ill-appearing, toxic-appearing or diaphoretic.  HENT:     Head: Normocephalic and atraumatic.     Right Ear: External ear normal.     Left Ear: External ear normal.     Nose: Nose normal.     Mouth/Throat:     Mouth: Mucous membranes are moist.     Pharynx: Oropharynx is clear.  Eyes:     General: No scleral icterus.       Right eye: No discharge.        Left eye: No discharge.     Extraocular Movements: Extraocular movements intact.     Conjunctiva/sclera: Conjunctivae normal.     Pupils: Pupils are equal, round, and reactive to light.  Cardiovascular:     Rate and Rhythm: Normal rate and regular rhythm.     Pulses: Normal pulses.      Heart sounds: Normal heart sounds. No murmur heard.    No friction rub. No gallop.  Pulmonary:     Effort: Pulmonary effort is normal. No respiratory distress.     Breath sounds: Normal breath sounds. No stridor. No wheezing, rhonchi or rales.  Chest:     Chest wall: No tenderness.  Musculoskeletal:        General: Normal range of motion.     Cervical back: Normal range of motion and neck supple.  Skin:    General: Skin is warm and dry.     Capillary Refill: Capillary refill takes less than 2 seconds.     Coloration: Skin is not jaundiced or pale.     Findings: No bruising, erythema, lesion or rash.  Neurological:     General: No focal deficit present.     Mental Status: She is alert and oriented to person, place, and time. Mental status is at baseline.  Psychiatric:        Mood and Affect: Mood normal.        Behavior: Behavior normal.        Thought Content: Thought content normal.        Judgment: Judgment normal.     Results for orders placed or performed in visit on 03/18/23  Microscopic Examination   Urine  Result Value Ref Range   WBC, UA 0-5 0 - 5 /hpf   RBC, Urine 0-2 0 - 2 /hpf   Epithelial Cells (non renal) 0-10 0 - 10 /hpf   Mucus, UA Present (A) Not Estab.   Bacteria, UA None seen None seen/Few  Urinalysis, Routine w reflex microscopic  Result Value Ref Range   Specific Gravity, UA 1.020 1.005 - 1.030   pH, UA 6.0 5.0 - 7.5   Color, UA Yellow Yellow   Appearance Ur Clear Clear   Leukocytes,UA 1+ (A) Negative   Protein,UA 1+ (A) Negative/Trace   Glucose, UA Negative Negative   Ketones, UA Trace (A) Negative   RBC, UA Trace (A) Negative   Bilirubin, UA Negative Negative   Urobilinogen, Ur 4.0 (H) 0.2 - 1.0 mg/dL   Nitrite, UA Negative Negative   Microscopic Examination See below:   Comprehensive metabolic panel  Result Value Ref Range   Glucose 96 70 - 99  mg/dL   BUN 13 6 - 24 mg/dL   Creatinine, Ser 1.61 0.57 - 1.00 mg/dL   eGFR 92 >09 UE/AVW/0.98    BUN/Creatinine Ratio 17 9 - 23   Sodium 143 134 - 144 mmol/L   Potassium 3.5 3.5 - 5.2 mmol/L   Chloride 100 96 - 106 mmol/L   CO2 30 (H) 20 - 29 mmol/L   Calcium 9.8 8.7 - 10.2 mg/dL   Total Protein 6.9 6.0 - 8.5 g/dL   Albumin 4.0 3.9 - 4.9 g/dL   Globulin, Total 2.9 1.5 - 4.5 g/dL   Bilirubin Total 0.4 0.0 - 1.2 mg/dL   Alkaline Phosphatase 83 44 - 121 IU/L   AST 17 0 - 40 IU/L   ALT 20 0 - 32 IU/L      Assessment & Plan:   Problem List Items Addressed This Visit       Other   Anxiety and depression - Primary    Father in law is dying. Having a lot of stress. Stable on the 20mg . Continue current medicine. Will start her on PRN lorazepam. Call with any concerns.       Relevant Medications   LORazepam (ATIVAN) 0.5 MG tablet   Other Visit Diagnoses     Concentration deficit       Would like 2nd opinion. Will refer to neuropsych testing. Call with any concerns.   Relevant Orders   Ambulatory referral to Neuropsychology        Follow up plan: Return in about 6 weeks (around 07/01/2023).

## 2023-06-13 ENCOUNTER — Inpatient Hospital Stay: Payer: No Typology Code available for payment source | Attending: Internal Medicine

## 2023-06-13 DIAGNOSIS — R5383 Other fatigue: Secondary | ICD-10-CM | POA: Diagnosis not present

## 2023-06-13 DIAGNOSIS — D508 Other iron deficiency anemias: Secondary | ICD-10-CM | POA: Diagnosis present

## 2023-06-13 DIAGNOSIS — Z8541 Personal history of malignant neoplasm of cervix uteri: Secondary | ICD-10-CM | POA: Insufficient documentation

## 2023-06-13 DIAGNOSIS — Z87891 Personal history of nicotine dependence: Secondary | ICD-10-CM | POA: Insufficient documentation

## 2023-06-13 DIAGNOSIS — E538 Deficiency of other specified B group vitamins: Secondary | ICD-10-CM | POA: Insufficient documentation

## 2023-06-13 DIAGNOSIS — E876 Hypokalemia: Secondary | ICD-10-CM | POA: Insufficient documentation

## 2023-06-13 DIAGNOSIS — Z9884 Bariatric surgery status: Secondary | ICD-10-CM | POA: Diagnosis not present

## 2023-06-13 DIAGNOSIS — E611 Iron deficiency: Secondary | ICD-10-CM

## 2023-06-13 DIAGNOSIS — Z803 Family history of malignant neoplasm of breast: Secondary | ICD-10-CM | POA: Insufficient documentation

## 2023-06-13 DIAGNOSIS — Z90710 Acquired absence of both cervix and uterus: Secondary | ICD-10-CM | POA: Insufficient documentation

## 2023-06-13 DIAGNOSIS — R0602 Shortness of breath: Secondary | ICD-10-CM | POA: Insufficient documentation

## 2023-06-13 DIAGNOSIS — R519 Headache, unspecified: Secondary | ICD-10-CM | POA: Diagnosis not present

## 2023-06-13 DIAGNOSIS — Z90722 Acquired absence of ovaries, bilateral: Secondary | ICD-10-CM | POA: Insufficient documentation

## 2023-06-13 DIAGNOSIS — R110 Nausea: Secondary | ICD-10-CM | POA: Diagnosis not present

## 2023-06-13 LAB — CBC WITH DIFFERENTIAL (CANCER CENTER ONLY)
Abs Immature Granulocytes: 0.01 10*3/uL (ref 0.00–0.07)
Basophils Absolute: 0 10*3/uL (ref 0.0–0.1)
Basophils Relative: 1 %
Eosinophils Absolute: 0.3 10*3/uL (ref 0.0–0.5)
Eosinophils Relative: 4 %
HCT: 39.3 % (ref 36.0–46.0)
Hemoglobin: 12.9 g/dL (ref 12.0–15.0)
Immature Granulocytes: 0 %
Lymphocytes Relative: 31 %
Lymphs Abs: 2.2 10*3/uL (ref 0.7–4.0)
MCH: 28.9 pg (ref 26.0–34.0)
MCHC: 32.8 g/dL (ref 30.0–36.0)
MCV: 88.1 fL (ref 80.0–100.0)
Monocytes Absolute: 0.6 10*3/uL (ref 0.1–1.0)
Monocytes Relative: 9 %
Neutro Abs: 3.8 10*3/uL (ref 1.7–7.7)
Neutrophils Relative %: 55 %
Platelet Count: 271 10*3/uL (ref 150–400)
RBC: 4.46 MIL/uL (ref 3.87–5.11)
RDW: 12.9 % (ref 11.5–15.5)
WBC Count: 6.9 10*3/uL (ref 4.0–10.5)
nRBC: 0 % (ref 0.0–0.2)

## 2023-06-13 LAB — BASIC METABOLIC PANEL - CANCER CENTER ONLY
Anion gap: 7 (ref 5–15)
BUN: 15 mg/dL (ref 6–20)
CO2: 30 mmol/L (ref 22–32)
Calcium: 9.2 mg/dL (ref 8.9–10.3)
Chloride: 101 mmol/L (ref 98–111)
Creatinine: 0.88 mg/dL (ref 0.44–1.00)
GFR, Estimated: >60 mL/min (ref >60)
Glucose, Bld: 105 mg/dL — ABNORMAL HIGH (ref 70–99)
Potassium: 3.5 mmol/L (ref 3.5–5.1)
Sodium: 138 mmol/L (ref 135–145)

## 2023-06-13 LAB — FERRITIN: Ferritin: 96 ng/mL (ref 11–307)

## 2023-06-13 LAB — IRON AND TIBC
Iron: 79 ug/dL (ref 28–170)
Saturation Ratios: 22 % (ref 10.4–31.8)
TIBC: 360 ug/dL (ref 250–450)
UIBC: 281 ug/dL

## 2023-06-13 LAB — VITAMIN B12: Vitamin B-12: 696 pg/mL (ref 180–914)

## 2023-06-16 ENCOUNTER — Other Ambulatory Visit: Payer: No Typology Code available for payment source

## 2023-06-16 ENCOUNTER — Inpatient Hospital Stay (HOSPITAL_BASED_OUTPATIENT_CLINIC_OR_DEPARTMENT_OTHER): Payer: No Typology Code available for payment source | Admitting: Internal Medicine

## 2023-06-16 ENCOUNTER — Telehealth: Payer: Self-pay

## 2023-06-16 ENCOUNTER — Inpatient Hospital Stay: Payer: No Typology Code available for payment source

## 2023-06-16 ENCOUNTER — Encounter: Payer: Self-pay | Admitting: Internal Medicine

## 2023-06-16 VITALS — BP 120/76 | HR 84 | Temp 97.9°F | Ht 67.5 in | Wt 192.6 lb

## 2023-06-16 VITALS — BP 136/81 | HR 58 | Temp 97.9°F | Resp 16

## 2023-06-16 DIAGNOSIS — D508 Other iron deficiency anemias: Secondary | ICD-10-CM | POA: Diagnosis not present

## 2023-06-16 DIAGNOSIS — E611 Iron deficiency: Secondary | ICD-10-CM | POA: Diagnosis not present

## 2023-06-16 MED ORDER — SODIUM CHLORIDE 0.9 % IV SOLN
Freq: Once | INTRAVENOUS | Status: AC
  Filled 2023-06-16: qty 250

## 2023-06-16 MED ORDER — SODIUM CHLORIDE 0.9 % IV SOLN
200.0000 mg | Freq: Once | INTRAVENOUS | Status: AC
  Administered 2023-06-16: 200 mg via INTRAVENOUS
  Filled 2023-06-16: qty 200

## 2023-06-16 MED ORDER — ONDANSETRON HCL 4 MG/2ML IJ SOLN
4.0000 mg | Freq: Once | INTRAMUSCULAR | Status: AC
  Administered 2023-06-16: 4 mg via INTRAVENOUS
  Filled 2023-06-16: qty 2

## 2023-06-16 NOTE — Telephone Encounter (Signed)
[facility] patient referral sent to Dr. Norma Fredrickson

## 2023-06-16 NOTE — Assessment & Plan Note (Addendum)
#  Severe iron deficiency-mild anemia; secondary to bariatric surgery.  Poor tolerance/response to oral iron. Currently on  bariatric MVT + iron-chewable  [since 2015].    # Hemoglobin improved- 12.3 today.  SEP 2024- ron saturation 22% ferritin 96. Proceed with IV iron infusion given the malabsorption.   # Fatigue/ "dyspnea on exertion": Hx of OSA [prior to bariatric surgery]; Poor sleep quality [family stressors]-no organic causes noted.  Counseled regarding sleep.  # Nausea-  ? PUD [Hx of bariatric surgery] - on PPI/carafate- referral to [facility] re: nasuea/ Hx of bariatric surgery  # Chronic mild hypokalemia-monitor for now.  # B12 def- continue on home b12 injection.   # headaches [Dr.Shah]- defer to PCP.   # DISPOSITION: # referral to [facility] re: nasuea/ Hx of bariatric surgery # venofer today # follow up in 6 months- MD; prior 2-3 days- labs- cbc/bmp; Iron studies; ferritin;b12 levels possible venofer- Dr.B

## 2023-06-16 NOTE — Progress Notes (Signed)
Fatigue/weakness: yes x1 month Dyspena: yes, doesn't feel like she's getting a whole breath Light headedness: yes, having headaches Blood in stool: no  Having 3-5 protein shakes per day.  SOB all the time, asthma.  Having some trouble swallowing solids.
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
## 2023-06-16 NOTE — Progress Notes (Signed)
Declined 30 minute post-observation. Aware of risks. Vitals stable at discharge.

## 2023-06-16 NOTE — Patient Instructions (Signed)
Iron Sucrose Injection What is this medication? IRON SUCROSE (EYE ern SOO krose) treats low levels of iron (iron deficiency anemia) in people with kidney disease. Iron is a mineral that plays an important role in making red blood cells, which carry oxygen from your lungs to the rest of your body. This medicine may be used for other purposes; ask your health care provider or pharmacist if you have questions. COMMON BRAND NAME(S): Venofer What should I tell my care team before I take this medication? They need to know if you have any of these conditions: Anemia not caused by low iron levels Heart disease High levels of iron in the blood Kidney disease Liver disease An unusual or allergic reaction to iron, other medications, foods, dyes, or preservatives Pregnant or trying to get pregnant Breastfeeding How should I use this medication? This medication is for infusion into a vein. It is given in a hospital or clinic setting. Talk to your care team about the use of this medication in children. While this medication may be prescribed for children as young as 2 years for selected conditions, precautions do apply. Overdosage: If you think you have taken too much of this medicine contact a poison control center or emergency room at once. NOTE: This medicine is only for you. Do not share this medicine with others. What if I miss a dose? Keep appointments for follow-up doses. It is important not to miss your dose. Call your care team if you are unable to keep an appointment. What may interact with this medication? Do not take this medication with any of the following: Deferoxamine Dimercaprol Other iron products This medication may also interact with the following: Chloramphenicol Deferasirox This list may not describe all possible interactions. Give your health care provider a list of all the medicines, herbs, non-prescription drugs, or dietary supplements you use. Also tell them if you smoke,  drink alcohol, or use illegal drugs. Some items may interact with your medicine. What should I watch for while using this medication? Visit your care team regularly. Tell your care team if your symptoms do not start to get better or if they get worse. You may need blood work done while you are taking this medication. You may need to follow a special diet. Talk to your care team. Foods that contain iron include: whole grains/cereals, dried fruits, beans, or peas, leafy green vegetables, and organ meats (liver, kidney). What side effects may I notice from receiving this medication? Side effects that you should report to your care team as soon as possible: Allergic reactions--skin rash, itching, hives, swelling of the face, lips, tongue, or throat Low blood pressure--dizziness, feeling faint or lightheaded, blurry vision Shortness of breath Side effects that usually do not require medical attention (report to your care team if they continue or are bothersome): Flushing Headache Joint pain Muscle pain Nausea Pain, redness, or irritation at injection site This list may not describe all possible side effects. Call your doctor for medical advice about side effects. You may report side effects to FDA at 1-800-FDA-1088. Where should I keep my medication? This medication is given in a hospital or clinic. It will not be stored at home. NOTE: This sheet is a summary. It may not cover all possible information. If you have questions about this medicine, talk to your doctor, pharmacist, or health care provider.  2024 Elsevier/Gold Standard (2023-02-21 00:00:00)

## 2023-06-20 ENCOUNTER — Ambulatory Visit: Payer: No Typology Code available for payment source | Admitting: Internal Medicine

## 2023-06-20 ENCOUNTER — Ambulatory Visit: Payer: No Typology Code available for payment source

## 2023-06-23 ENCOUNTER — Other Ambulatory Visit: Payer: Self-pay | Admitting: Family Medicine

## 2023-06-24 NOTE — Telephone Encounter (Signed)
Requested Prescriptions  Pending Prescriptions Disp Refills   lisinopril-hydrochlorothiazide (ZESTORETIC) 20-25 MG tablet [Pharmacy Med Name: Lisinopril-hydroCHLOROthiazide 20-25 MG Oral Tablet] 90 tablet 1    Sig: Take 1 tablet by mouth once daily     Cardiovascular:  ACEI + Diuretic Combos Passed - 06/23/2023  9:43 AM      Passed - Na in normal range and within 180 days    Sodium  Date Value Ref Range Status  06/13/2023 138 135 - 145 mmol/L Final  03/18/2023 143 134 - 144 mmol/L Final  10/10/2014 140 136 - 145 mmol/L Final         Passed - K in normal range and within 180 days    Potassium  Date Value Ref Range Status  06/13/2023 3.5 3.5 - 5.1 mmol/L Final  10/10/2014 3.6 3.5 - 5.1 mmol/L Final         Passed - Cr in normal range and within 180 days    Creatinine  Date Value Ref Range Status  06/13/2023 0.88 0.44 - 1.00 mg/dL Final  16/07/9603 5.40 0.60 - 1.30 mg/dL Final         Passed - eGFR is 30 or above and within 180 days    EGFR (African American)  Date Value Ref Range Status  10/10/2014 >60 >15mL/min Final  02/23/2014 >60  Final   GFR calc Af Amer  Date Value Ref Range Status  08/08/2020 91 >59 mL/min/1.73 Final    Comment:    **In accordance with recommendations from the NKF-ASN Task force,**   [facility] is in the process of updating its eGFR calculation to the   2021 CKD-EPI creatinine equation that estimates kidney function   without a race variable.    EGFR (Non-African Amer.)  Date Value Ref Range Status  10/10/2014 >60 >66mL/min Final    Comment:    eGFR values <21mL/min/1.73 m2 may be an indication of chronic kidney disease (CKD). Calculated eGFR, using the MRDR Study equation, is useful in  patients with stable renal function. The eGFR calculation will not be reliable in acutely ill patients when serum creatinine is changing rapidly. It is not useful in patients on dialysis. The eGFR calculation may not be applicable to patients at the low and  high extremes of body sizes, pregnant women, and vegetarians.   02/23/2014 >60  Final    Comment:    eGFR values <39mL/min/1.73 m2 may be an indication of chronic kidney disease (CKD). Calculated eGFR is useful in patients with stable renal function. The eGFR calculation will not be reliable in acutely ill patients when serum creatinine is changing rapidly. It is not useful in  patients on dialysis. The eGFR calculation may not be applicable to patients at the low and high extremes of body sizes, pregnant women, and vegetarians.    GFR, Estimated  Date Value Ref Range Status  06/13/2023 >60 >60 mL/min Final    Comment:    (NOTE) Calculated using the CKD-EPI Creatinine Equation (2021)    eGFR  Date Value Ref Range Status  03/18/2023 92 >59 mL/min/1.73 Final         Passed - Patient is not pregnant      Passed - Last BP in normal range    BP Readings from Last 1 Encounters:  06/16/23 136/81         Passed - Valid encounter within last 6 months    Recent Outpatient Visits           1 month ago  Anxiety and depression   [facility] [place], [place] P, DO   3 months ago Acute pain of right knee   [facility] [place], [place] P, DO   3 months ago Acute right-sided low back pain with right-sided sciatica   [facility] [place], [place] P, DO   6 months ago Acute bacterial conjunctivitis of both eyes   [place] [facility] Larae Grooms, NP   8 months ago Routine general medical examination at a health care facility   [facility] Dorcas Carrow, DO       Future Appointments             In 2 weeks Dorcas Carrow, DO [place] [facility], [facility]

## 2023-06-27 ENCOUNTER — Other Ambulatory Visit: Payer: Self-pay | Admitting: Family Medicine

## 2023-06-27 NOTE — Telephone Encounter (Signed)
Requested medication (s) are due for refill today:   Provider to review  Requested medication (s) are on the active medication list:   Yes  Future visit scheduled:   Yes 10/8   Last ordered: 03/24/2023 #12, 0 refills  Non delegated refill    Requested Prescriptions  Pending Prescriptions Disp Refills   Vitamin D, Ergocalciferol, (DRISDOL) 1.25 MG (50000 UNIT) CAPS capsule [Pharmacy Med Name: Vitamin D (Ergocalciferol) 50000 UNIT Oral Capsule] 12 capsule 0    Sig: Take 1 capsule by mouth once a week     Endocrinology:  Vitamins - Vitamin D Supplementation 2 Failed - 06/27/2023  9:04 AM      Failed - Manual Review: Route requests for 50,000 IU strength to the provider      Passed - Ca in normal range and within 360 days    Calcium  Date Value Ref Range Status  06/13/2023 9.2 8.9 - 10.3 mg/dL Final   Calcium, Total  Date Value Ref Range Status  10/10/2014 9.2 8.5 - 10.1 mg/dL Final   Calcium, Ion  Date Value Ref Range Status  07/12/2008 1.20  Final         Passed - Vitamin D in normal range and within 360 days    Vit D, 1,25-Dihydroxy  Date Value Ref Range Status  04/06/2015 57.7 19.9 - 79.3 pg/mL Final   Vit D, 25-Hydroxy  Date Value Ref Range Status  10/03/2022 34.0 30.0 - 100.0 ng/mL Final    Comment:    Vitamin D deficiency has been defined by the Institute of Medicine and an Endocrine Society practice guideline as a level of serum 25-OH vitamin D less than 20 ng/mL (1,2). The Endocrine Society went on to further define vitamin D insufficiency as a level between 21 and 29 ng/mL (2). 1. IOM (Institute of Medicine). 2010. Dietary reference    intakes for calcium and D. [place]: The    [employer]. 2. Holick MF, Binkley [place], Bischoff-Ferrari HA, et al.    Evaluation, treatment, and prevention of vitamin D    deficiency: an Endocrine Society clinical practice    guideline. JCEM. 2011 Jul; 96(7):1911-30.          Passed - Valid encounter within  last 12 months    Recent Outpatient Visits           1 month ago Anxiety and depression   [place] [facility] [place], Megan P, DO   3 months ago Acute pain of right knee   [facility] [place], Megan P, DO   4 months ago Acute right-sided low back pain with right-sided sciatica   [facility] [place], [place] P, DO   6 months ago Acute bacterial conjunctivitis of both eyes   [place] [facility] Larae Grooms, NP   8 months ago Routine general medical examination at a health care facility   [facility] Dorcas Carrow, DO       Future Appointments             In 1 week Dorcas Carrow, DO [place] [facility], [facility]

## 2023-07-04 ENCOUNTER — Emergency Department: Payer: No Typology Code available for payment source

## 2023-07-04 ENCOUNTER — Emergency Department
Admission: EM | Admit: 2023-07-04 | Discharge: 2023-07-04 | Disposition: A | Discharge status: Home / Self Care | Payer: No Typology Code available for payment source | Attending: Emergency Medicine | Admitting: Emergency Medicine

## 2023-07-04 ENCOUNTER — Other Ambulatory Visit: Payer: Self-pay

## 2023-07-04 DIAGNOSIS — S161XXA Strain of muscle, fascia and tendon at neck level, initial encounter: Secondary | ICD-10-CM | POA: Insufficient documentation

## 2023-07-04 DIAGNOSIS — Y92410 Unspecified street and highway as the place of occurrence of the external cause: Secondary | ICD-10-CM | POA: Insufficient documentation

## 2023-07-04 DIAGNOSIS — G43809 Other migraine, not intractable, without status migrainosus: Secondary | ICD-10-CM | POA: Insufficient documentation

## 2023-07-04 DIAGNOSIS — M542 Cervicalgia: Secondary | ICD-10-CM | POA: Diagnosis present

## 2023-07-04 MED ORDER — DIPHENHYDRAMINE HCL 50 MG/ML IJ SOLN
25.0000 mg | Freq: Once | INTRAMUSCULAR | Status: AC
  Administered 2023-07-04: 25 mg via INTRAMUSCULAR
  Filled 2023-07-04: qty 1

## 2023-07-04 MED ORDER — CYCLOBENZAPRINE HCL 10 MG PO TABS: 10.0000 mg | ORAL_TABLET | Freq: Three times a day (TID) | ORAL | 0 refills | Status: DC | PRN

## 2023-07-04 MED ORDER — METOCLOPRAMIDE HCL 5 MG/ML IJ SOLN
10.0000 mg | Freq: Once | INTRAMUSCULAR | Status: AC
  Administered 2023-07-04: 10 mg via INTRAMUSCULAR
  Filled 2023-07-04: qty 2

## 2023-07-04 MED ORDER — KETOROLAC TROMETHAMINE 30 MG/ML IJ SOLN
30.0000 mg | Freq: Once | INTRAMUSCULAR | Status: AC
  Administered 2023-07-04: 30 mg via INTRAMUSCULAR
  Filled 2023-07-04: qty 1

## 2023-07-04 MED ORDER — ONDANSETRON 4 MG PO TBDP
4.0000 mg | ORAL_TABLET | Freq: Once | ORAL | Status: AC | PRN
  Administered 2023-07-04: 4 mg via ORAL

## 2023-07-04 MED ORDER — IBUPROFEN 600 MG PO TABS: 600.0000 mg | ORAL_TABLET | Freq: Four times a day (QID) | ORAL | 0 refills | Status: DC | PRN

## 2023-07-04 MED ORDER — ONDANSETRON 4 MG PO TBDP
ORAL_TABLET | ORAL | Status: AC
  Filled 2023-07-04: qty 1

## 2023-07-04 NOTE — Discharge Instructions (Signed)
Return to the ER for new, worsening, or persistent severe headache, neck or back pain, weakness or numbness in your arms or legs, dizziness, vomiting, or any other new or worsening symptoms that concern you.
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
## 2023-07-08 ENCOUNTER — Encounter: Payer: Self-pay | Admitting: Family Medicine

## 2023-07-08 ENCOUNTER — Ambulatory Visit: Payer: No Typology Code available for payment source | Admitting: Family Medicine

## 2023-07-08 VITALS — BP 123/75 | HR 76 | Ht 67.0 in | Wt 191.8 lb

## 2023-07-08 DIAGNOSIS — F32A Depression, unspecified: Secondary | ICD-10-CM

## 2023-07-08 DIAGNOSIS — F419 Anxiety disorder, unspecified: Secondary | ICD-10-CM | POA: Diagnosis not present

## 2023-07-08 NOTE — Progress Notes (Signed)
BP 123/75   Pulse 76   Ht 5\' 7"  (1.702 m)   Wt 191 lb 12.8 oz (87 kg)   SpO2 99%   BMI 30.04 kg/m    Subjective:    Patient ID: Tina Harvey, female    DOB: Feb 23, 1971, 52 y.o.   MRN: [id number]  HPI: Tina Harvey is a 52 y.o. female  Chief Complaint  Patient presents with   Depression   Anxiety   ANXIETY/DEPRESSION- father in law just passed away. She is doing OK.  Duration: chronic Status: controlled Anxious mood: no  Excessive worrying: no Irritability: no  Sweating: no Nausea: no Palpitations:no Hyperventilation: no Panic attacks: no Agoraphobia: no  Obscessions/compulsions: no Depressed mood: no    07/08/2023   10:01 AM 08/07/2022    9:22 AM 06/13/2022    8:47 AM 04/01/2022    8:54 AM 12/20/2021    3:05 PM  Depression screen PHQ 2/9  Decreased Interest 0 0 0 0 0  Down, Depressed, Hopeless 0 0 0 0 0  PHQ - 2 Score 0 0 0 0 0  Altered sleeping 3 3  0 2  Tired, decreased energy 3 3  0 3  Change in appetite 0 0  0 3  Feeling bad or failure about yourself  0 0  0 0  Trouble concentrating 0 0  0 2  Moving slowly or fidgety/restless 0 0  0 0  Suicidal thoughts 0 0  0 0  PHQ-9 Score 6 6  0 10  Difficult doing work/chores Very difficult Very difficult  Not difficult at all       07/08/2023   10:01 AM 08/07/2022    9:22 AM 04/01/2022    8:54 AM 12/20/2021    3:06 PM  GAD 7 : Generalized Anxiety Score  Nervous, Anxious, on Edge 0 0 0 0  Control/stop worrying 0 0 0 0  Worry too much - different things 0 0 0 0  Trouble relaxing 3 0 0 3  Restless 0 0 0 0  Easily annoyed or irritable 0 0 0 0  Afraid - awful might happen 0 0 0 0  Total GAD 7 Score 3 0 0 3  Anxiety Difficulty Very difficult Not difficult at all Not difficult at all Not difficult at all   Anhedonia: no Weight changes: no Insomnia: no   Hypersomnia: no Fatigue/loss of energy: no Feelings of worthlessness: no Feelings of guilt: no Impaired concentration/indecisiveness: no Suicidal ideations: no   Crying spells: no Recent Stressors/Life Changes: yes   Relationship problems: no   Family stress: yes     Financial stress: no    Job stress: no    Recent death/loss: yes  Relevant past medical, surgical, family and social history reviewed and updated as indicated. Interim medical history since our last visit reviewed. Allergies and medications reviewed and updated.  Review of Systems  Constitutional: Negative.   Respiratory: Negative.    Cardiovascular: Negative.   Gastrointestinal: Negative.   Musculoskeletal:  Positive for myalgias, neck pain and neck stiffness. Negative for arthralgias, back pain, gait problem and joint swelling.  Skin: Negative.   Psychiatric/Behavioral: Negative.      Per HPI unless specifically indicated above     Objective:    BP 123/75   Pulse 76   Ht 5\' 7"  (1.702 m)   Wt 191 lb 12.8 oz (87 kg)   SpO2 99%   BMI 30.04 kg/m   Wt Readings from Last 3 Encounters:  07/08/23 191 lb 12.8 oz (87 kg)  07/04/23 190 lb (86.2 kg)  06/16/23 192 lb 9.6 oz (87.4 kg)    Physical Exam Vitals and nursing note reviewed.  Constitutional:      General: She is not in acute distress.    Appearance: Normal appearance. She is not ill-appearing, toxic-appearing or diaphoretic.  HENT:     Head: Normocephalic and atraumatic.     Right Ear: External ear normal.     Left Ear: External ear normal.     Nose: Nose normal.     Mouth/Throat:     Mouth: Mucous membranes are moist.     Pharynx: Oropharynx is clear.  Eyes:     General: No scleral icterus.       Right eye: No discharge.        Left eye: No discharge.     Extraocular Movements: Extraocular movements intact.     Conjunctiva/sclera: Conjunctivae normal.     Pupils: Pupils are equal, round, and reactive to light.  Cardiovascular:     Rate and Rhythm: Normal rate and regular rhythm.     Pulses: Normal pulses.     Heart sounds: Normal heart sounds. No murmur heard.    No friction rub. No gallop.   Pulmonary:     Effort: Pulmonary effort is normal. No respiratory distress.     Breath sounds: Normal breath sounds. No stridor. No wheezing, rhonchi or rales.  Chest:     Chest wall: No tenderness.  Musculoskeletal:        General: Normal range of motion.     Cervical back: Normal range of motion and neck supple.  Skin:    General: Skin is warm and dry.     Capillary Refill: Capillary refill takes less than 2 seconds.     Coloration: Skin is not jaundiced or pale.     Findings: No bruising, erythema, lesion or rash.  Neurological:     General: No focal deficit present.     Mental Status: She is alert and oriented to person, place, and time. Mental status is at baseline.  Psychiatric:        Mood and Affect: Mood normal.        Behavior: Behavior normal.        Thought Content: Thought content normal.        Judgment: Judgment normal.     Results for orders placed or performed in visit on 06/13/23  Vitamin B12  Result Value Ref Range   Vitamin B-12 696 180 - 914 pg/mL  Ferritin  Result Value Ref Range   Ferritin 96 11 - 307 ng/mL  Iron and TIBC  Result Value Ref Range   Iron 79 28 - 170 ug/dL   TIBC 161 096 - 045 ug/dL   Saturation Ratios 22 10.4 - 31.8 %   UIBC 281 ug/dL  Basic Metabolic Panel - Cancer Center Only  Result Value Ref Range   Sodium 138 135 - 145 mmol/L   Potassium 3.5 3.5 - 5.1 mmol/L   Chloride 101 98 - 111 mmol/L   CO2 30 22 - 32 mmol/L   Glucose, Bld 105 (H) 70 - 99 mg/dL   BUN 15 6 - 20 mg/dL   Creatinine 4.09 8.11 - 1.00 mg/dL   Calcium 9.2 8.9 - 91.4 mg/dL   GFR, Estimated >78 >29 mL/min   Anion gap 7 5 - 15  CBC with Differential (Cancer Center Only)  Result Value Ref Range  WBC Count 6.9 4.0 - 10.5 K/uL   RBC 4.46 3.87 - 5.11 MIL/uL   Hemoglobin 12.9 12.0 - 15.0 g/dL   HCT 16.1 09.6 - 04.5 %   MCV 88.1 80.0 - 100.0 fL   MCH 28.9 26.0 - 34.0 pg   MCHC 32.8 30.0 - 36.0 g/dL   RDW 40.9 81.1 - 91.4 %   Platelet Count 271 150 - 400 K/uL    nRBC 0.0 0.0 - 0.2 %   Neutrophils Relative % 55 %   Neutro Abs 3.8 1.7 - 7.7 K/uL   Lymphocytes Relative 31 %   Lymphs Abs 2.2 0.7 - 4.0 K/uL   Monocytes Relative 9 %   Monocytes Absolute 0.6 0.1 - 1.0 K/uL   Eosinophils Relative 4 %   Eosinophils Absolute 0.3 0.0 - 0.5 K/uL   Basophils Relative 1 %   Basophils Absolute 0.0 0.0 - 0.1 K/uL   Immature Granulocytes 0 %   Abs Immature Granulocytes 0.01 0.00 - 0.07 K/uL      Assessment & Plan:   Problem List Items Addressed This Visit       Other   Anxiety and depression - Primary    Currently complicated by grief. Doing OK. Will continue to monitor. Call with any concerns.         Follow up plan: Return for January Physical.

## 2023-07-08 NOTE — Assessment & Plan Note (Signed)
Currently complicated by grief. Doing OK. Will continue to monitor. Call with any concerns.

## 2023-08-17 ENCOUNTER — Other Ambulatory Visit: Payer: Self-pay | Admitting: Family Medicine

## 2023-08-18 NOTE — Telephone Encounter (Signed)
Requested Prescriptions  Pending Prescriptions Disp Refills   escitalopram (LEXAPRO) 20 MG tablet [Pharmacy Med Name: Escitalopram Oxalate 20 MG Oral Tablet] 90 tablet 1    Sig: Take 1 tablet by mouth once daily     Psychiatry:  Antidepressants - SSRI Passed - 08/17/2023 11:15 AM      Passed - Completed PHQ-2 or PHQ-9 in the last 360 days      Passed - Valid encounter within last 6 months    Recent Outpatient Visits           1 month ago Anxiety and depression   [place] [facility] [place], Megan P, DO   3 months ago Anxiety and depression   [facility] [place], Megan P, DO   5 months ago Acute pain of right knee   [place] [facility] [place], Megan P, DO   5 months ago Acute right-sided low back pain with right-sided sciatica   [place] [facility] [place], Megan P, DO   8 months ago Acute bacterial conjunctivitis of both eyes   [facility] Larae Grooms, NP       Future Appointments             In 1 month [place] Ben[place], Oralia Rud, DO [facility], [facility]

## 2023-08-22 ENCOUNTER — Ambulatory Visit: Payer: No Typology Code available for payment source

## 2023-08-22 ENCOUNTER — Encounter: Payer: Self-pay | Admitting: Internal Medicine

## 2023-08-22 DIAGNOSIS — Z9884 Bariatric surgery status: Secondary | ICD-10-CM | POA: Diagnosis not present

## 2023-08-22 DIAGNOSIS — R1319 Other dysphagia: Secondary | ICD-10-CM | POA: Diagnosis not present

## 2023-08-22 DIAGNOSIS — K219 Gastro-esophageal reflux disease without esophagitis: Secondary | ICD-10-CM | POA: Diagnosis not present

## 2023-08-22 DIAGNOSIS — R1013 Epigastric pain: Secondary | ICD-10-CM | POA: Diagnosis present

## 2023-10-07 ENCOUNTER — Telehealth: Payer: Self-pay

## 2023-10-07 ENCOUNTER — Ambulatory Visit
Admission: RE | Admit: 2023-10-07 | Discharge: 2023-10-07 | Disposition: A | Discharge status: Home / Self Care | Payer: No Typology Code available for payment source | Source: Ambulatory Visit | Attending: Family Medicine | Admitting: Family Medicine

## 2023-10-07 ENCOUNTER — Ambulatory Visit (INDEPENDENT_AMBULATORY_CARE_PROVIDER_SITE_OTHER): Payer: No Typology Code available for payment source | Admitting: Family Medicine

## 2023-10-07 ENCOUNTER — Ambulatory Visit
Admission: RE | Admit: 2023-10-07 | Discharge: 2023-10-07 | Disposition: A | Discharge status: Home / Self Care | Payer: No Typology Code available for payment source | Attending: Family Medicine | Admitting: Family Medicine

## 2023-10-07 ENCOUNTER — Encounter: Payer: Self-pay | Admitting: Family Medicine

## 2023-10-07 VITALS — BP 117/73 | HR 80 | Ht 66.0 in | Wt 190.4 lb

## 2023-10-07 DIAGNOSIS — M533 Sacrococcygeal disorders, not elsewhere classified: Secondary | ICD-10-CM | POA: Insufficient documentation

## 2023-10-07 DIAGNOSIS — Z0000 Encounter for general adult medical examination without abnormal findings: Secondary | ICD-10-CM

## 2023-10-07 DIAGNOSIS — M109 Gout, unspecified: Secondary | ICD-10-CM

## 2023-10-07 DIAGNOSIS — R0781 Pleurodynia: Secondary | ICD-10-CM | POA: Diagnosis present

## 2023-10-07 DIAGNOSIS — I10 Essential (primary) hypertension: Secondary | ICD-10-CM | POA: Diagnosis not present

## 2023-10-07 DIAGNOSIS — E538 Deficiency of other specified B group vitamins: Secondary | ICD-10-CM | POA: Diagnosis not present

## 2023-10-07 DIAGNOSIS — Z1231 Encounter for screening mammogram for malignant neoplasm of breast: Secondary | ICD-10-CM

## 2023-10-07 DIAGNOSIS — M8000XS Age-related osteoporosis with current pathological fracture, unspecified site, sequela: Secondary | ICD-10-CM

## 2023-10-07 DIAGNOSIS — E782 Mixed hyperlipidemia: Secondary | ICD-10-CM

## 2023-10-07 DIAGNOSIS — F32A Depression, unspecified: Secondary | ICD-10-CM

## 2023-10-07 DIAGNOSIS — R059 Cough, unspecified: Secondary | ICD-10-CM | POA: Diagnosis present

## 2023-10-07 DIAGNOSIS — W19XXXA Unspecified fall, initial encounter: Secondary | ICD-10-CM

## 2023-10-07 DIAGNOSIS — D509 Iron deficiency anemia, unspecified: Secondary | ICD-10-CM

## 2023-10-07 DIAGNOSIS — R0789 Other chest pain: Secondary | ICD-10-CM

## 2023-10-07 DIAGNOSIS — F419 Anxiety disorder, unspecified: Secondary | ICD-10-CM

## 2023-10-07 DIAGNOSIS — E559 Vitamin D deficiency, unspecified: Secondary | ICD-10-CM

## 2023-10-07 DIAGNOSIS — Z8639 Personal history of other endocrine, nutritional and metabolic disease: Secondary | ICD-10-CM

## 2023-10-07 LAB — MICROALBUMIN, URINE WAIVED
Creatinine, Urine Waived: 300 mg/dL (ref 10–300)
Microalb, Ur Waived: 150 mg/L — ABNORMAL HIGH (ref 0–19)

## 2023-10-07 LAB — BAYER DCA HB A1C WAIVED: HB A1C (BAYER DCA - WAIVED): 5.3 % (ref 4.8–5.6)

## 2023-10-07 MED ORDER — VITAMIN D (ERGOCALCIFEROL) 1.25 MG (50000 UNIT) PO CAPS: 50000.0000 [IU] | ORAL_CAPSULE | ORAL | 1 refills | Status: DC

## 2023-10-07 MED ORDER — ALBUTEROL SULFATE (2.5 MG/3ML) 0.083% IN NEBU: 2.5000 mg | INHALATION_SOLUTION | Freq: Four times a day (QID) | RESPIRATORY_TRACT | 1 refills | Status: DC | PRN

## 2023-10-07 MED ORDER — LISINOPRIL-HYDROCHLOROTHIAZIDE 20-25 MG PO TABS: 1.0000 | ORAL_TABLET | Freq: Every day | ORAL | 1 refills | Status: DC

## 2023-10-07 MED ORDER — KETOROLAC TROMETHAMINE 60 MG/2ML IM SOLN
60.0000 mg | Freq: Once | INTRAMUSCULAR | Status: AC
Start: 2023-10-07 — End: 2023-10-07
  Administered 2023-10-07: 60 mg via INTRAMUSCULAR

## 2023-10-07 MED ORDER — PANTOPRAZOLE SODIUM 40 MG PO TBEC: 40.0000 mg | DELAYED_RELEASE_TABLET | Freq: Every day | ORAL | 1 refills | Status: DC

## 2023-10-07 MED ORDER — PREDNISONE 50 MG PO TABS: 50.0000 mg | ORAL_TABLET | Freq: Every day | ORAL | 0 refills | Status: DC

## 2023-10-07 MED ORDER — ATORVASTATIN CALCIUM 10 MG PO TABS: 10.0000 mg | ORAL_TABLET | Freq: Every day | ORAL | 1 refills | Status: DC

## 2023-10-07 MED ORDER — ALBUTEROL SULFATE HFA 108 (90 BASE) MCG/ACT IN AERS: INHALATION_SPRAY | RESPIRATORY_TRACT | 1 refills | Status: DC

## 2023-10-07 MED ORDER — CYANOCOBALAMIN 1000 MCG/ML IJ SOLN: 1000.0000 ug | INTRAMUSCULAR | 5 refills | Status: DC

## 2023-10-07 MED ORDER — BUSPIRONE HCL 5 MG PO TABS: 5.0000 mg | ORAL_TABLET | Freq: Three times a day (TID) | ORAL | 6 refills | Status: DC

## 2023-10-07 MED ORDER — ESCITALOPRAM OXALATE 20 MG PO TABS: 20.0000 mg | ORAL_TABLET | Freq: Every day | ORAL | 1 refills | Status: DC

## 2023-10-07 NOTE — Assessment & Plan Note (Signed)
 Under good control on current regimen. Continue current regimen. Continue to monitor. Call with any concerns. Refills given. Labs drawn today.

## 2023-10-07 NOTE — Assessment & Plan Note (Signed)
 Rechecking labs today. Await results. Treat as needed.

## 2023-10-07 NOTE — Assessment & Plan Note (Signed)
 Refills given today. Up to date on DEXA. Continue to monitor.
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
## 2023-10-07 NOTE — Telephone Encounter (Signed)
-----   Message from Olevia Perches sent at 10/07/2023  9:57 AM EST ----- Nebulizer machine dx: asthma

## 2023-10-07 NOTE — Patient Instructions (Signed)
 Please call to schedule your mammogram and/or bone density: [facility] at [facility]  Address: [address], [place], [place] [address] Phone: [phone]  [facility] [address] [place],  [place]  [address] Phone: [phone]

## 2023-10-07 NOTE — Telephone Encounter (Signed)
 Hand written prescription has been placed in provider's folder for signature.

## 2023-10-08 LAB — LIPID PANEL W/O CHOL/HDL RATIO
Cholesterol, Total: 246 mg/dL — ABNORMAL HIGH (ref 100–199)
HDL: 40 mg/dL (ref >39)
LDL Chol Calc (NIH): 169 mg/dL — ABNORMAL HIGH (ref 0–99)
Triglycerides: 198 mg/dL — ABNORMAL HIGH (ref 0–149)
VLDL Cholesterol Cal: 37 mg/dL (ref 5–40)

## 2023-10-08 LAB — CBC WITH DIFFERENTIAL/PLATELET
Basophils Absolute: 0 10*3/uL (ref 0.0–0.2)
Basos: 0 %
EOS (ABSOLUTE): 0.4 10*3/uL (ref 0.0–0.4)
Eos: 6 %
Hematocrit: 37.3 % (ref 34.0–46.6)
Hemoglobin: 12.8 g/dL (ref 11.1–15.9)
Immature Grans (Abs): 0 10*3/uL (ref 0.0–0.1)
Immature Granulocytes: 0 %
Lymphocytes Absolute: 2.1 10*3/uL (ref 0.7–3.1)
Lymphs: 30 %
MCH: 30.1 pg (ref 26.6–33.0)
MCHC: 34.3 g/dL (ref 31.5–35.7)
MCV: 88 fL (ref 79–97)
Monocytes Absolute: 0.6 10*3/uL (ref 0.1–0.9)
Monocytes: 8 %
Neutrophils Absolute: 4 10*3/uL (ref 1.4–7.0)
Neutrophils: 56 %
Platelets: 277 10*3/uL (ref 150–450)
RBC: 4.25 x10E6/uL (ref 3.77–5.28)
RDW: 12.9 % (ref 11.7–15.4)
WBC: 7.2 10*3/uL (ref 3.4–10.8)

## 2023-10-08 LAB — COMPREHENSIVE METABOLIC PANEL
ALT: 11 [IU]/L (ref 0–32)
AST: 18 [IU]/L (ref 0–40)
Albumin: 4.2 g/dL (ref 3.8–4.9)
Alkaline Phosphatase: 81 [IU]/L (ref 44–121)
BUN/Creatinine Ratio: 14 (ref 9–23)
BUN: 11 mg/dL (ref 6–24)
Bilirubin Total: 0.4 mg/dL (ref 0.0–1.2)
CO2: 30 mmol/L — ABNORMAL HIGH (ref 20–29)
Calcium: 9.2 mg/dL (ref 8.7–10.2)
Chloride: 101 mmol/L (ref 96–106)
Creatinine, Ser: 0.81 mg/dL (ref 0.57–1.00)
Globulin, Total: 3 g/dL (ref 1.5–4.5)
Glucose: 91 mg/dL (ref 70–99)
Potassium: 3.3 mmol/L — ABNORMAL LOW (ref 3.5–5.2)
Sodium: 141 mmol/L (ref 134–144)
Total Protein: 7.2 g/dL (ref 6.0–8.5)
eGFR: 88 mL/min/{1.73_m2} (ref >59)

## 2023-10-08 LAB — TSH: TSH: 1.66 u[IU]/mL (ref 0.450–4.500)

## 2023-10-08 LAB — VITAMIN B12: Vitamin B-12: 1210 pg/mL (ref 232–1245)

## 2023-10-08 LAB — URIC ACID: Uric Acid: 5.4 mg/dL (ref 3.0–7.2)

## 2023-10-08 LAB — FERRITIN: Ferritin: 337 ng/mL — ABNORMAL HIGH (ref 15–150)

## 2023-10-08 LAB — VITAMIN D 25 HYDROXY (VIT D DEFICIENCY, FRACTURES): Vit D, 25-Hydroxy: 57.2 ng/mL (ref 30.0–100.0)

## 2023-10-09 NOTE — Telephone Encounter (Signed)
 Pt called to check on status of request for nebulizer machine, please advise

## 2023-10-10 NOTE — Telephone Encounter (Signed)
 Patient was notified via MyChart. Advised to reach out to the office if she has any questions or concerns.

## 2023-12-11 ENCOUNTER — Other Ambulatory Visit: Payer: Self-pay | Admitting: *Deleted

## 2023-12-11 DIAGNOSIS — E611 Iron deficiency: Secondary | ICD-10-CM

## 2023-12-12 ENCOUNTER — Inpatient Hospital Stay: Payer: No Typology Code available for payment source | Attending: Internal Medicine

## 2023-12-12 DIAGNOSIS — Z803 Family history of malignant neoplasm of breast: Secondary | ICD-10-CM | POA: Diagnosis not present

## 2023-12-12 DIAGNOSIS — Z808 Family history of malignant neoplasm of other organs or systems: Secondary | ICD-10-CM | POA: Insufficient documentation

## 2023-12-12 DIAGNOSIS — D508 Other iron deficiency anemias: Secondary | ICD-10-CM | POA: Insufficient documentation

## 2023-12-12 DIAGNOSIS — Z8541 Personal history of malignant neoplasm of cervix uteri: Secondary | ICD-10-CM | POA: Diagnosis not present

## 2023-12-12 DIAGNOSIS — Z87891 Personal history of nicotine dependence: Secondary | ICD-10-CM | POA: Insufficient documentation

## 2023-12-12 DIAGNOSIS — R110 Nausea: Secondary | ICD-10-CM | POA: Insufficient documentation

## 2023-12-12 DIAGNOSIS — R519 Headache, unspecified: Secondary | ICD-10-CM | POA: Diagnosis not present

## 2023-12-12 DIAGNOSIS — Z9884 Bariatric surgery status: Secondary | ICD-10-CM | POA: Diagnosis not present

## 2023-12-12 DIAGNOSIS — E876 Hypokalemia: Secondary | ICD-10-CM | POA: Insufficient documentation

## 2023-12-12 DIAGNOSIS — E611 Iron deficiency: Secondary | ICD-10-CM

## 2023-12-12 LAB — BASIC METABOLIC PANEL - CANCER CENTER ONLY
Anion gap: 11 (ref 5–15)
BUN: 14 mg/dL (ref 6–20)
CO2: 29 mmol/L (ref 22–32)
Calcium: 9.3 mg/dL (ref 8.9–10.3)
Chloride: 98 mmol/L (ref 98–111)
Creatinine: 0.84 mg/dL (ref 0.44–1.00)
GFR, Estimated: >60 mL/min (ref >60)
Glucose, Bld: 98 mg/dL (ref 70–99)
Potassium: 3.2 mmol/L — ABNORMAL LOW (ref 3.5–5.1)
Sodium: 138 mmol/L (ref 135–145)

## 2023-12-12 LAB — IRON AND TIBC
Iron: 111 ug/dL (ref 28–170)
Saturation Ratios: 30 % (ref 10.4–31.8)
TIBC: 371 ug/dL (ref 250–450)
UIBC: 260 ug/dL

## 2023-12-12 LAB — FERRITIN: Ferritin: 171 ng/mL (ref 11–307)

## 2023-12-12 LAB — CBC WITH DIFFERENTIAL (CANCER CENTER ONLY)
Abs Immature Granulocytes: 0.02 10*3/uL (ref 0.00–0.07)
Basophils Absolute: 0 10*3/uL (ref 0.0–0.1)
Basophils Relative: 1 %
Eosinophils Absolute: 0.2 10*3/uL (ref 0.0–0.5)
Eosinophils Relative: 3 %
HCT: 38.2 % (ref 36.0–46.0)
Hemoglobin: 13 g/dL (ref 12.0–15.0)
Immature Granulocytes: 0 %
Lymphocytes Relative: 25 %
Lymphs Abs: 1.8 10*3/uL (ref 0.7–4.0)
MCH: 29.9 pg (ref 26.0–34.0)
MCHC: 34 g/dL (ref 30.0–36.0)
MCV: 87.8 fL (ref 80.0–100.0)
Monocytes Absolute: 0.6 10*3/uL (ref 0.1–1.0)
Monocytes Relative: 8 %
Neutro Abs: 4.3 10*3/uL (ref 1.7–7.7)
Neutrophils Relative %: 63 %
Platelet Count: 273 10*3/uL (ref 150–400)
RBC: 4.35 MIL/uL (ref 3.87–5.11)
RDW: 13.5 % (ref 11.5–15.5)
WBC Count: 6.9 10*3/uL (ref 4.0–10.5)
nRBC: 0 % (ref 0.0–0.2)

## 2023-12-12 LAB — VITAMIN B12: Vitamin B-12: 739 pg/mL (ref 180–914)

## 2023-12-16 ENCOUNTER — Inpatient Hospital Stay: Payer: No Typology Code available for payment source

## 2023-12-16 ENCOUNTER — Encounter: Payer: Self-pay | Admitting: Internal Medicine

## 2023-12-16 ENCOUNTER — Inpatient Hospital Stay (HOSPITAL_BASED_OUTPATIENT_CLINIC_OR_DEPARTMENT_OTHER): Payer: No Typology Code available for payment source | Admitting: Internal Medicine

## 2023-12-16 DIAGNOSIS — E611 Iron deficiency: Secondary | ICD-10-CM | POA: Diagnosis not present

## 2023-12-16 DIAGNOSIS — D508 Other iron deficiency anemias: Secondary | ICD-10-CM | POA: Diagnosis not present
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
## 2023-12-16 NOTE — Progress Notes (Signed)
 Fatigue/weakness: YES Dyspena: YES Light headedness: YES-dizziness and feeling unsteady is happening more Blood in stool: NO  C/O having headaches, sleeping more and "foggy brain" as well x1 month.  Larey Seat about a month or 2 ago, hurt tail bone/bruise, dogs tripped her.  Had an endoscopy 2/25, Dr. Norma Fredrickson. Having some trouble swallowing solids and liquids and nausea.

## 2023-12-16 NOTE — Assessment & Plan Note (Addendum)
#  Severe iron deficiency-mild anemia; secondary to bariatric surgery.  Poor tolerance/response to oral iron. Currently on  bariatric MVT + iron-chewable  [since 2015].    # Hemoglobin improved- 13 today.  MARCH 2025- ron saturation 30 % ferritin 161. HOLD IV iron infusion.   # Nausea- s/p EGD-Dr.Toledo  [2025- feb]- stable.   # Chronic mild hypokalemia-OTC Potassium- cannot swallow "big pill ". Recommend talking to PCP re: switching Lisinopril-hydrochlorothiazide- monitor for now.  # B12 def- continue on home b12 injection.   # headaches [Dr.Shah]- defer to PCP.   # DISPOSITION: # HOLD venofer today # follow up in 6 months- MD; prior 2-3 days- labs- cbc/bmp; Iron studies; ferritin;b12 levels possible venofer- Dr.B

## 2023-12-18 ENCOUNTER — Ambulatory Visit: Admitting: Family Medicine

## 2023-12-18 ENCOUNTER — Encounter: Payer: Self-pay | Admitting: Family Medicine

## 2023-12-18 VITALS — BP 122/82 | HR 70 | Temp 97.7°F | Resp 17 | Ht 65.98 in | Wt 194.8 lb

## 2023-12-18 DIAGNOSIS — I10 Essential (primary) hypertension: Secondary | ICD-10-CM | POA: Diagnosis not present

## 2023-12-18 DIAGNOSIS — E876 Hypokalemia: Secondary | ICD-10-CM

## 2023-12-18 MED ORDER — SPIRONOLACTONE 25 MG PO TABS: 25.0000 mg | ORAL_TABLET | Freq: Every day | ORAL | 3 refills | Status: DC

## 2023-12-18 MED ORDER — LISINOPRIL 20 MG PO TABS: 20.0000 mg | ORAL_TABLET | Freq: Every day | ORAL | 1 refills | Status: DC

## 2023-12-18 NOTE — Progress Notes (Signed)
 BP 122/82 (BP Location: Left Arm, Patient Position: Sitting, Cuff Size: Large)   Pulse 70   Temp 97.7 F (36.5 C) (Oral)   Resp 17   Ht 5' 5.98" (1.676 m)   Wt 194 lb 12.8 oz (88.4 kg)   SpO2 98%   BMI 31.46 kg/m    Subjective:    Patient ID: Tina Harvey, female    DOB: 10/07/1970, 53 y.o.   MRN: [id number]  HPI: Tina Harvey is a 53 y.o. female  Chief Complaint  Patient presents with   Hypertension    Cancer doc is concerned with meds due to potassium defiency   HYPERTENSION- has been chronically low level hypokalemic Hypertension status: controlled  Satisfied with current treatment? yes Duration of hypertension: chronic BP monitoring frequency:  rarely BP medication side effects:  no Medication compliance: excellent compliance Previous BP meds:lisinopril-HCTZ Aspirin: no Recurrent headaches: no Visual changes: no Palpitations: no Dyspnea: no Chest pain: no Lower extremity edema: no Dizzy/lightheaded: no   Relevant past medical, surgical, family and social history reviewed and updated as indicated. Interim medical history since our last visit reviewed. Allergies and medications reviewed and updated.  Review of Systems  Constitutional: Negative.   Respiratory: Negative.    Cardiovascular: Negative.   Musculoskeletal: Negative.   Neurological: Negative.   Psychiatric/Behavioral: Negative.      Per HPI unless specifically indicated above     Objective:    BP 122/82 (BP Location: Left Arm, Patient Position: Sitting, Cuff Size: Large)   Pulse 70   Temp 97.7 F (36.5 C) (Oral)   Resp 17   Ht 5' 5.98" (1.676 m)   Wt 194 lb 12.8 oz (88.4 kg)   SpO2 98%   BMI 31.46 kg/m   Wt Readings from Last 3 Encounters:  12/18/23 194 lb 12.8 oz (88.4 kg)  12/16/23 195 lb 6.4 oz (88.6 kg)  10/07/23 190 lb 6.4 oz (86.4 kg)    Physical Exam Vitals and nursing note reviewed.  Constitutional:      General: She is not in acute distress.    Appearance: Normal  appearance. She is not ill-appearing, toxic-appearing or diaphoretic.  HENT:     Head: Normocephalic and atraumatic.     Right Ear: External ear normal.     Left Ear: External ear normal.     Nose: Nose normal.     Mouth/Throat:     Mouth: Mucous membranes are moist.     Pharynx: Oropharynx is clear.  Eyes:     General: No scleral icterus.       Right eye: No discharge.        Left eye: No discharge.     Extraocular Movements: Extraocular movements intact.     Conjunctiva/sclera: Conjunctivae normal.     Pupils: Pupils are equal, round, and reactive to light.  Cardiovascular:     Rate and Rhythm: Normal rate and regular rhythm.     Pulses: Normal pulses.     Heart sounds: Normal heart sounds. No murmur heard.    No friction rub. No gallop.  Pulmonary:     Effort: Pulmonary effort is normal. No respiratory distress.     Breath sounds: Normal breath sounds. No stridor. No wheezing, rhonchi or rales.  Chest:     Chest wall: No tenderness.  Musculoskeletal:        General: Normal range of motion.     Cervical back: Normal range of motion and neck supple.  Skin:  General: Skin is warm and dry.     Capillary Refill: Capillary refill takes less than 2 seconds.     Coloration: Skin is not jaundiced or pale.     Findings: No bruising, erythema, lesion or rash.  Neurological:     General: No focal deficit present.     Mental Status: She is alert and oriented to person, place, and time. Mental status is at baseline.  Psychiatric:        Mood and Affect: Mood normal.        Behavior: Behavior normal.        Thought Content: Thought content normal.        Judgment: Judgment normal.     Results for orders placed or performed in visit on 12/12/23  Vitamin B12   Collection Time: 12/12/23  9:47 AM  Result Value Ref Range   Vitamin B-12 739 180 - 914 pg/mL  Ferritin   Collection Time: 12/12/23  9:48 AM  Result Value Ref Range   Ferritin 171 11 - 307 ng/mL  Iron and TIBC    Collection Time: 12/12/23  9:48 AM  Result Value Ref Range   Iron 111 28 - 170 ug/dL   TIBC 161 096 - 045 ug/dL   Saturation Ratios 30 10.4 - 31.8 %   UIBC 260 ug/dL  Basic Metabolic Panel - Cancer Center Only   Collection Time: 12/12/23  9:48 AM  Result Value Ref Range   Sodium 138 135 - 145 mmol/L   Potassium 3.2 (L) 3.5 - 5.1 mmol/L   Chloride 98 98 - 111 mmol/L   CO2 29 22 - 32 mmol/L   Glucose, Bld 98 70 - 99 mg/dL   BUN 14 6 - 20 mg/dL   Creatinine 4.09 8.11 - 1.00 mg/dL   Calcium 9.3 8.9 - 91.4 mg/dL   GFR, Estimated >78 >29 mL/min   Anion gap 11 5 - 15  CBC with Differential (Cancer Center Only)   Collection Time: 12/12/23  9:48 AM  Result Value Ref Range   WBC Count 6.9 4.0 - 10.5 K/uL   RBC 4.35 3.87 - 5.11 MIL/uL   Hemoglobin 13.0 12.0 - 15.0 g/dL   HCT 56.2 13.0 - 86.5 %   MCV 87.8 80.0 - 100.0 fL   MCH 29.9 26.0 - 34.0 pg   MCHC 34.0 30.0 - 36.0 g/dL   RDW 78.4 69.6 - 29.5 %   Platelet Count 273 150 - 400 K/uL   nRBC 0.0 0.0 - 0.2 %   Neutrophils Relative % 63 %   Neutro Abs 4.3 1.7 - 7.7 K/uL   Lymphocytes Relative 25 %   Lymphs Abs 1.8 0.7 - 4.0 K/uL   Monocytes Relative 8 %   Monocytes Absolute 0.6 0.1 - 1.0 K/uL   Eosinophils Relative 3 %   Eosinophils Absolute 0.2 0.0 - 0.5 K/uL   Basophils Relative 1 %   Basophils Absolute 0.0 0.0 - 0.1 K/uL   Immature Granulocytes 0 %   Abs Immature Granulocytes 0.02 0.00 - 0.07 K/uL      Assessment & Plan:   Problem List Items Addressed This Visit       Cardiovascular and Mediastinum   Benign essential hypertension - Primary   Will change HCTZ to spironalactone and recheck in 1 month. Call with any concerns.       Relevant Medications   lisinopril (ZESTRIL) 20 MG tablet   spironolactone (ALDACTONE) 25 MG tablet   Other Visit Diagnoses  Hypokalemia       Will change HCTZ to spironalactone and recheck in 1 month. Call with any concerns.        Follow up plan: Return in about 4 weeks (around  01/15/2024).

## 2023-12-18 NOTE — Assessment & Plan Note (Signed)
 Will change HCTZ to spironalactone and recheck in 1 month. Call with any concerns.

## 2023-12-31 ENCOUNTER — Ambulatory Visit
Admission: RE | Admit: 2023-12-31 | Discharge: 2023-12-31 | Disposition: A | Discharge status: Home / Self Care | Source: Ambulatory Visit | Attending: Family Medicine | Admitting: Family Medicine

## 2023-12-31 DIAGNOSIS — Z1231 Encounter for screening mammogram for malignant neoplasm of breast: Secondary | ICD-10-CM | POA: Insufficient documentation

## 2024-01-01 ENCOUNTER — Encounter: Payer: Self-pay | Admitting: Family Medicine

## 2024-01-26 ENCOUNTER — Encounter: Payer: Self-pay | Admitting: Family Medicine

## 2024-01-26 ENCOUNTER — Ambulatory Visit: Admitting: Family Medicine

## 2024-01-26 VITALS — BP 124/84 | HR 68 | Ht 67.5 in | Wt 192.0 lb

## 2024-01-26 DIAGNOSIS — I10 Essential (primary) hypertension: Secondary | ICD-10-CM

## 2024-01-26 MED ORDER — LISINOPRIL 20 MG PO TABS: 20.0000 mg | ORAL_TABLET | Freq: Every day | ORAL | 0 refills | Status: DC

## 2024-01-26 MED ORDER — ESCITALOPRAM OXALATE 20 MG PO TABS: 20.0000 mg | ORAL_TABLET | Freq: Every day | ORAL | 0 refills | Status: DC

## 2024-01-26 MED ORDER — CYANOCOBALAMIN 1000 MCG/ML IJ SOLN: 1000.0000 ug | INTRAMUSCULAR | 1 refills | Status: DC

## 2024-01-26 MED ORDER — BUSPIRONE HCL 5 MG PO TABS: 5.0000 mg | ORAL_TABLET | Freq: Three times a day (TID) | ORAL | 3 refills | Status: DC

## 2024-01-26 MED ORDER — PANTOPRAZOLE SODIUM 40 MG PO TBEC: 40.0000 mg | DELAYED_RELEASE_TABLET | Freq: Every day | ORAL | 1 refills | Status: DC

## 2024-01-26 MED ORDER — ALBUTEROL SULFATE (2.5 MG/3ML) 0.083% IN NEBU: 2.5000 mg | INHALATION_SOLUTION | Freq: Four times a day (QID) | RESPIRATORY_TRACT | 0 refills | Status: AC | PRN

## 2024-01-26 MED ORDER — ALBUTEROL SULFATE HFA 108 (90 BASE) MCG/ACT IN AERS: INHALATION_SPRAY | RESPIRATORY_TRACT | 0 refills | Status: DC

## 2024-01-26 MED ORDER — DICLOFENAC SODIUM 1 % EX GEL: 4.0000 g | Freq: Four times a day (QID) | CUTANEOUS | 0 refills | Status: AC

## 2024-01-26 MED ORDER — ATORVASTATIN CALCIUM 10 MG PO TABS: 10.0000 mg | ORAL_TABLET | Freq: Every day | ORAL | 0 refills | Status: DC

## 2024-01-26 MED ORDER — VITAMIN D (ERGOCALCIFEROL) 1.25 MG (50000 UNIT) PO CAPS: 50000.0000 [IU] | ORAL_CAPSULE | ORAL | 0 refills | Status: DC

## 2024-01-26 NOTE — Assessment & Plan Note (Signed)
 Under good control on current regimen. Continue current regimen. Continue to monitor. Call with any concerns. Refills pending BMP. Await results.

## 2024-01-26 NOTE — Progress Notes (Signed)
 BP 124/84 (BP Location: Left Arm, Patient Position: Sitting, Cuff Size: Large)   Pulse 68   Ht 5' 7.5" (1.715 m)   Wt 192 lb (87.1 kg)   SpO2 97%   BMI 29.63 kg/m    Subjective:    Patient ID: Tina Harvey, female    DOB: 1970-11-03, 53 y.o.   MRN: [id number]  HPI: Tina Harvey is a 53 y.o. female  Chief Complaint  Patient presents with   Hypertension   HYPERTENSION- feeling better on the spironalactone. Doesn't feel as swollen. Feeling better with breathing.  Hypertension status: controlled  Satisfied with current treatment? yes Duration of hypertension: chronic BP monitoring frequency:  not checking BP medication side effects:  no Medication compliance: excellent compliance Previous BP meds:spironalactone, lisinopril ,  Aspirin: no Recurrent headaches: no Visual changes: no Palpitations: no Dyspnea: no Chest pain: no Lower extremity edema: no Dizzy/lightheaded: no   Relevant past medical, surgical, family and social history reviewed and updated as indicated. Interim medical history since our last visit reviewed. Allergies and medications reviewed and updated.  Review of Systems  Constitutional: Negative.   Respiratory: Negative.    Cardiovascular: Negative.   Musculoskeletal: Negative.   Neurological: Negative.   Psychiatric/Behavioral: Negative.      Per HPI unless specifically indicated above     Objective:    BP 124/84 (BP Location: Left Arm, Patient Position: Sitting, Cuff Size: Large)   Pulse 68   Ht 5' 7.5" (1.715 m)   Wt 192 lb (87.1 kg)   SpO2 97%   BMI 29.63 kg/m   Wt Readings from Last 3 Encounters:  01/26/24 192 lb (87.1 kg)  12/18/23 194 lb 12.8 oz (88.4 kg)  12/16/23 195 lb 6.4 oz (88.6 kg)    Physical Exam Vitals and nursing note reviewed.  Constitutional:      General: She is not in acute distress.    Appearance: Normal appearance. She is not ill-appearing, toxic-appearing or diaphoretic.  HENT:     Head: Normocephalic and  atraumatic.     Right Ear: External ear normal.     Left Ear: External ear normal.     Nose: Nose normal.     Mouth/Throat:     Mouth: Mucous membranes are moist.     Pharynx: Oropharynx is clear.  Eyes:     General: No scleral icterus.       Right eye: No discharge.        Left eye: No discharge.     Extraocular Movements: Extraocular movements intact.     Conjunctiva/sclera: Conjunctivae normal.     Pupils: Pupils are equal, round, and reactive to light.  Cardiovascular:     Rate and Rhythm: Normal rate and regular rhythm.     Pulses: Normal pulses.     Heart sounds: Normal heart sounds. No murmur heard.    No friction rub. No gallop.  Pulmonary:     Effort: Pulmonary effort is normal. No respiratory distress.     Breath sounds: Normal breath sounds. No stridor. No wheezing, rhonchi or rales.  Chest:     Chest wall: No tenderness.  Musculoskeletal:        General: Normal range of motion.     Cervical back: Normal range of motion and neck supple.  Skin:    General: Skin is warm and dry.     Capillary Refill: Capillary refill takes less than 2 seconds.     Coloration: Skin is not jaundiced or pale.  Findings: No bruising, erythema, lesion or rash.  Neurological:     General: No focal deficit present.     Mental Status: She is alert and oriented to person, place, and time. Mental status is at baseline.  Psychiatric:        Mood and Affect: Mood normal.        Behavior: Behavior normal.        Thought Content: Thought content normal.        Judgment: Judgment normal.     Results for orders placed or performed in visit on 12/12/23  Vitamin B12   Collection Time: 12/12/23  9:47 AM  Result Value Ref Range   Vitamin B-12 739 180 - 914 pg/mL  Ferritin   Collection Time: 12/12/23  9:48 AM  Result Value Ref Range   Ferritin 171 11 - 307 ng/mL  Iron  and TIBC   Collection Time: 12/12/23  9:48 AM  Result Value Ref Range   Iron  111 28 - 170 ug/dL   TIBC 119 147 - 829  ug/dL   Saturation Ratios 30 10.4 - 31.8 %   UIBC 260 ug/dL  Basic Metabolic Panel - Cancer Center Only   Collection Time: 12/12/23  9:48 AM  Result Value Ref Range   Sodium 138 135 - 145 mmol/L   Potassium 3.2 (L) 3.5 - 5.1 mmol/L   Chloride 98 98 - 111 mmol/L   CO2 29 22 - 32 mmol/L   Glucose, Bld 98 70 - 99 mg/dL   BUN 14 6 - 20 mg/dL   Creatinine 5.62 1.30 - 1.00 mg/dL   Calcium  9.3 8.9 - 10.3 mg/dL   GFR, Estimated >86 >57 mL/min   Anion gap 11 5 - 15  CBC with Differential (Cancer Center Only)   Collection Time: 12/12/23  9:48 AM  Result Value Ref Range   WBC Count 6.9 4.0 - 10.5 K/uL   RBC 4.35 3.87 - 5.11 MIL/uL   Hemoglobin 13.0 12.0 - 15.0 g/dL   HCT 84.6 96.2 - 95.2 %   MCV 87.8 80.0 - 100.0 fL   MCH 29.9 26.0 - 34.0 pg   MCHC 34.0 30.0 - 36.0 g/dL   RDW 84.1 32.4 - 40.1 %   Platelet Count 273 150 - 400 K/uL   nRBC 0.0 0.0 - 0.2 %   Neutrophils Relative % 63 %   Neutro Abs 4.3 1.7 - 7.7 K/uL   Lymphocytes Relative 25 %   Lymphs Abs 1.8 0.7 - 4.0 K/uL   Monocytes Relative 8 %   Monocytes Absolute 0.6 0.1 - 1.0 K/uL   Eosinophils Relative 3 %   Eosinophils Absolute 0.2 0.0 - 0.5 K/uL   Basophils Relative 1 %   Basophils Absolute 0.0 0.0 - 0.1 K/uL   Immature Granulocytes 0 %   Abs Immature Granulocytes 0.02 0.00 - 0.07 K/uL      Assessment & Plan:   Problem List Items Addressed This Visit       Cardiovascular and Mediastinum   Benign essential hypertension - Primary   Under good control on current regimen. Continue current regimen. Continue to monitor. Call with any concerns. Refills pending BMP. Await results.        Relevant Medications   atorvastatin  (LIPITOR) 10 MG tablet   lisinopril  (ZESTRIL ) 20 MG tablet   Other Relevant Orders   Basic metabolic panel with GFR     Follow up plan: Return in about 6 months (around 07/27/2024) for ok to cancel appt  in July.

## 2024-01-27 LAB — BASIC METABOLIC PANEL WITH GFR
BUN/Creatinine Ratio: 15 (ref 9–23)
BUN: 16 mg/dL (ref 6–24)
CO2: 26 mmol/L (ref 20–29)
Calcium: 9.6 mg/dL (ref 8.7–10.2)
Chloride: 102 mmol/L (ref 96–106)
Creatinine, Ser: 1.06 mg/dL — ABNORMAL HIGH (ref 0.57–1.00)
Glucose: 73 mg/dL (ref 70–99)
Potassium: 4.1 mmol/L (ref 3.5–5.2)
Sodium: 142 mmol/L (ref 134–144)
eGFR: 64 mL/min/{1.73_m2} (ref >59)

## 2024-01-28 ENCOUNTER — Encounter: Payer: Self-pay | Admitting: Family Medicine

## 2024-02-24 ENCOUNTER — Other Ambulatory Visit: Payer: Self-pay | Admitting: Family Medicine

## 2024-02-24 NOTE — Telephone Encounter (Unsigned)
 Copied from CRM [id number]. Topic: Clinical - Medication Refill >> Feb 24, 2024  9:34 AM [place] J wrote: Medication: ondansetron  (ZOFRAN  ODT) 4 MG disintegrating tablet  Has the patient contacted their pharmacy? No (Agent: If no, request that the patient contact the pharmacy for the refill. If patient does not wish to contact the pharmacy document the reason why and proceed with request.) (Agent: If yes, when and what did the pharmacy advise?) It's been a while since patient has requested a refill and decided to call primary directly. This is the patient's preferred pharmacy:   [facility] [address] [place] - [address] [place] [place] [address] Phone: [phone] Fax: [phone]  Is this the correct pharmacy for this prescription? Yes If no, delete pharmacy and type the correct one.   Has the prescription been filled recently? No  Is the patient out of the medication? Yes  Has the patient been seen for an appointment in the last year OR does the patient have an upcoming appointment? Yes  Can we respond through MyChart? Yes  Agent: Please be advised that Rx refills may take up to 3 business days. We ask that you follow-up with your pharmacy.

## 2024-02-26 MED ORDER — ONDANSETRON 4 MG PO TBDP: 4.0000 mg | ORAL_TABLET | Freq: Three times a day (TID) | ORAL | 0 refills | Status: AC | PRN

## 2024-02-26 NOTE — Telephone Encounter (Signed)
 Requested medication (s) are due for refill today: Yes  Requested medication (s) are on the active medication list: Yes  Last refill:  08/14/18  Future visit scheduled: Yes  Notes to clinic:  Unable to refill per protocol, cannot delegate.      Requested Prescriptions  Pending Prescriptions Disp Refills   ondansetron  (ZOFRAN  ODT) 4 MG disintegrating tablet 20 tablet 0    Sig: Take 1 tablet (4 mg total) by mouth every 8 (eight) hours as needed for nausea or vomiting.     Not Delegated - Gastroenterology: Antiemetics - ondansetron  Failed - 02/26/2024  2:37 PM      Failed - This refill cannot be delegated      Failed - Valid encounter within last 6 months    Recent Outpatient Visits           1 month ago Benign essential hypertension   [place] [facility] [place], [place] P, DO   2 months ago Benign essential hypertension   [facility] [place], [place] P, DO              Passed - AST in normal range and within 360 days    AST  Date Value Ref Range Status  10/07/2023 18 0 - 40 IU/L Final   SGOT(AST)  Date Value Ref Range Status  10/10/2014 21 15 - 37 Unit/L Final         Passed - ALT in normal range and within 360 days    ALT  Date Value Ref Range Status  10/07/2023 11 0 - 32 IU/L Final   SGPT (ALT)  Date Value Ref Range Status  10/10/2014 24 U/L Final    Comment:    14-63 NOTE: New Reference Range 04/19/14

## 2024-03-08 ENCOUNTER — Encounter: Payer: Self-pay | Admitting: Family Medicine

## 2024-03-08 MED ORDER — EPINEPHRINE 0.3 MG/0.3ML IJ SOAJ: 0.3000 mg | INTRAMUSCULAR | 12 refills | Status: AC | PRN

## 2024-04-06 ENCOUNTER — Other Ambulatory Visit: Payer: Self-pay | Admitting: Family Medicine

## 2024-04-08 ENCOUNTER — Other Ambulatory Visit: Payer: Self-pay

## 2024-04-08 DIAGNOSIS — M542 Cervicalgia: Secondary | ICD-10-CM

## 2024-04-08 DIAGNOSIS — G43011 Migraine without aura, intractable, with status migrainosus: Secondary | ICD-10-CM

## 2024-04-08 DIAGNOSIS — M5481 Occipital neuralgia: Secondary | ICD-10-CM

## 2024-04-08 NOTE — Telephone Encounter (Signed)
 Requested Prescriptions  Pending Prescriptions Disp Refills   spironolactone  (ALDACTONE ) 25 MG tablet [Pharmacy Med Name: Spironolactone  25 MG Oral Tablet] 30 tablet 0    Sig: Take 1 tablet by mouth once daily     Cardiovascular: Diuretics - Aldosterone Antagonist Failed - 04/08/2024 12:41 PM      Failed - Cr in normal range and within 180 days    Creatinine  Date Value Ref Range Status  12/12/2023 0.84 0.44 - 1.00 mg/dL Final  98/88/7983 9.13 0.60 - 1.30 mg/dL Final   Creatinine, Ser  Date Value Ref Range Status  01/26/2024 1.06 (H) 0.57 - 1.00 mg/dL Final         Passed - K in normal range and within 180 days    Potassium  Date Value Ref Range Status  01/26/2024 4.1 3.5 - 5.2 mmol/L Final  10/10/2014 3.6 3.5 - 5.1 mmol/L Final         Passed - Na in normal range and within 180 days    Sodium  Date Value Ref Range Status  01/26/2024 142 134 - 144 mmol/L Final  10/10/2014 140 136 - 145 mmol/L Final         Passed - eGFR is 30 or above and within 180 days    EGFR (African American)  Date Value Ref Range Status  10/10/2014 >60 >11mL/min Final  02/23/2014 >60  Final   GFR calc Af Amer  Date Value Ref Range Status  08/08/2020 91 >59 mL/min/1.73 Final    Comment:    **In accordance with recommendations from the NKF-ASN Task force,**   [facility] is in the process of updating its eGFR calculation to the   2021 CKD-EPI creatinine equation that estimates kidney function   without a race variable.    EGFR (Non-African Amer.)  Date Value Ref Range Status  10/10/2014 >60 >68mL/min Final    Comment:    eGFR values <75mL/min/1.73 m2 may be an indication of chronic kidney disease (CKD). Calculated eGFR, using the MRDR Study equation, is useful in  patients with stable renal function. The eGFR calculation will not be reliable in acutely ill patients when serum creatinine is changing rapidly. It is not useful in patients on dialysis. The eGFR calculation may not be  applicable to patients at the low and high extremes of body sizes, pregnant women, and vegetarians.   02/23/2014 >60  Final    Comment:    eGFR values <10mL/min/1.73 m2 may be an indication of chronic kidney disease (CKD). Calculated eGFR is useful in patients with stable renal function. The eGFR calculation will not be reliable in acutely ill patients when serum creatinine is changing rapidly. It is not useful in  patients on dialysis. The eGFR calculation may not be applicable to patients at the low and high extremes of body sizes, pregnant women, and vegetarians.    GFR, Estimated  Date Value Ref Range Status  12/12/2023 >60 >60 mL/min Final    Comment:    (NOTE) Calculated using the CKD-EPI Creatinine Equation (2021)    eGFR  Date Value Ref Range Status  01/26/2024 64 >59 mL/min/1.73 Final         Passed - Last BP in normal range    BP Readings from Last 1 Encounters:  01/26/24 124/84         Passed - Valid encounter within last 6 months    Recent Outpatient Visits           2 months ago Benign essential  hypertension   [place] [facility] [place], [place] P, DO   3 months ago Benign essential hypertension   [place] [facility] [place], [employer], [place]

## 2024-04-09 ENCOUNTER — Telehealth: Payer: Self-pay | Admitting: Family Medicine

## 2024-04-09 NOTE — Telephone Encounter (Unsigned)
 Copied from CRM [id number]. Topic: Clinical - Medication Question >> Apr 09, 2024 11:36 AM Delon HERO wrote: Reason for CRM: Patient is calling to request medicaiton for  claustrophobia as she has an MRI scheduled 04/14/24

## 2024-04-12 ENCOUNTER — Ambulatory Visit: Payer: Self-pay | Admitting: Family Medicine

## 2024-04-12 ENCOUNTER — Other Ambulatory Visit: Payer: Self-pay

## 2024-04-12 DIAGNOSIS — M5481 Occipital neuralgia: Secondary | ICD-10-CM

## 2024-04-12 DIAGNOSIS — G43111 Migraine with aura, intractable, with status migrainosus: Secondary | ICD-10-CM

## 2024-04-12 MED ORDER — ALPRAZOLAM 0.5 MG PO TABS: ORAL_TABLET | ORAL | 0 refills | Status: DC

## 2024-04-12 NOTE — Telephone Encounter (Signed)
 Pt called back regarding her request from last week. States that she is scheduled on Wednesday. Seeking Rx soon.

## 2024-04-14 ENCOUNTER — Ambulatory Visit
Admission: RE | Admit: 2024-04-14 | Discharge: 2024-04-14 | Disposition: A | Discharge status: Home / Self Care | Source: Ambulatory Visit

## 2024-04-14 ENCOUNTER — Ambulatory Visit

## 2024-04-14 DIAGNOSIS — G43111 Migraine with aura, intractable, with status migrainosus: Secondary | ICD-10-CM | POA: Insufficient documentation

## 2024-04-14 DIAGNOSIS — M542 Cervicalgia: Secondary | ICD-10-CM | POA: Insufficient documentation

## 2024-04-14 DIAGNOSIS — G43011 Migraine without aura, intractable, with status migrainosus: Secondary | ICD-10-CM | POA: Diagnosis present

## 2024-04-14 DIAGNOSIS — M5481 Occipital neuralgia: Secondary | ICD-10-CM | POA: Insufficient documentation

## 2024-04-16 ENCOUNTER — Encounter: Payer: Self-pay | Admitting: Advanced Practice Midwife

## 2024-06-06 ENCOUNTER — Encounter: Payer: Self-pay | Admitting: Family Medicine

## 2024-06-17 ENCOUNTER — Inpatient Hospital Stay

## 2024-06-17 ENCOUNTER — Inpatient Hospital Stay: Attending: Internal Medicine

## 2024-06-17 DIAGNOSIS — E538 Deficiency of other specified B group vitamins: Secondary | ICD-10-CM | POA: Diagnosis not present

## 2024-06-17 DIAGNOSIS — D509 Iron deficiency anemia, unspecified: Secondary | ICD-10-CM | POA: Diagnosis present

## 2024-06-17 DIAGNOSIS — E611 Iron deficiency: Secondary | ICD-10-CM

## 2024-06-17 DIAGNOSIS — Z8541 Personal history of malignant neoplasm of cervix uteri: Secondary | ICD-10-CM | POA: Insufficient documentation

## 2024-06-17 DIAGNOSIS — R5383 Other fatigue: Secondary | ICD-10-CM | POA: Insufficient documentation

## 2024-06-17 LAB — CBC WITH DIFFERENTIAL (CANCER CENTER ONLY)
Abs Immature Granulocytes: 0.03 K/uL (ref 0.00–0.07)
Basophils Absolute: 0.1 K/uL (ref 0.0–0.1)
Basophils Relative: 1 %
Eosinophils Absolute: 0.2 K/uL (ref 0.0–0.5)
Eosinophils Relative: 2 %
HCT: 38.3 % (ref 36.0–46.0)
Hemoglobin: 12.6 g/dL (ref 12.0–15.0)
Immature Granulocytes: 0 %
Lymphocytes Relative: 28 %
Lymphs Abs: 2.2 K/uL (ref 0.7–4.0)
MCH: 29 pg (ref 26.0–34.0)
MCHC: 32.9 g/dL (ref 30.0–36.0)
MCV: 88.2 fL (ref 80.0–100.0)
Monocytes Absolute: 0.7 K/uL (ref 0.1–1.0)
Monocytes Relative: 9 %
Neutro Abs: 4.6 K/uL (ref 1.7–7.7)
Neutrophils Relative %: 60 %
Platelet Count: 277 K/uL (ref 150–400)
RBC: 4.34 MIL/uL (ref 3.87–5.11)
RDW: 13.1 % (ref 11.5–15.5)
WBC Count: 7.8 K/uL (ref 4.0–10.5)
nRBC: 0 % (ref 0.0–0.2)

## 2024-06-17 LAB — VITAMIN B12: Vitamin B-12: 587 pg/mL (ref 180–914)

## 2024-06-17 LAB — BASIC METABOLIC PANEL - CANCER CENTER ONLY
Anion gap: 8 (ref 5–15)
BUN: 14 mg/dL (ref 6–20)
CO2: 26 mmol/L (ref 22–32)
Calcium: 9.3 mg/dL (ref 8.9–10.3)
Chloride: 101 mmol/L (ref 98–111)
Creatinine: 0.92 mg/dL (ref 0.44–1.00)
GFR, Estimated: >60 mL/min (ref >60)
Glucose, Bld: 81 mg/dL (ref 70–99)
Potassium: 3.9 mmol/L (ref 3.5–5.1)
Sodium: 135 mmol/L (ref 135–145)

## 2024-06-17 LAB — IRON AND TIBC
Iron: 71 ug/dL (ref 28–170)
Saturation Ratios: 18 % (ref 10.4–31.8)
TIBC: 391 ug/dL (ref 250–450)
UIBC: 320 ug/dL

## 2024-06-17 LAB — FERRITIN: Ferritin: 121 ng/mL (ref 11–307)

## 2024-06-21 ENCOUNTER — Other Ambulatory Visit

## 2024-06-23 ENCOUNTER — Inpatient Hospital Stay

## 2024-06-23 ENCOUNTER — Encounter: Payer: Self-pay | Admitting: Internal Medicine

## 2024-06-23 ENCOUNTER — Inpatient Hospital Stay (HOSPITAL_BASED_OUTPATIENT_CLINIC_OR_DEPARTMENT_OTHER): Admitting: Internal Medicine

## 2024-06-23 ENCOUNTER — Other Ambulatory Visit: Payer: Self-pay | Admitting: Family Medicine

## 2024-06-23 VITALS — BP 122/77 | HR 78 | Temp 97.9°F | Resp 16 | Ht 67.5 in | Wt 196.5 lb

## 2024-06-23 VITALS — BP 147/96 | HR 68 | Resp 18

## 2024-06-23 DIAGNOSIS — E611 Iron deficiency: Secondary | ICD-10-CM

## 2024-06-23 DIAGNOSIS — D509 Iron deficiency anemia, unspecified: Secondary | ICD-10-CM | POA: Diagnosis not present

## 2024-06-23 DIAGNOSIS — M5412 Radiculopathy, cervical region: Secondary | ICD-10-CM

## 2024-06-23 MED ORDER — IRON SUCROSE 20 MG/ML IV SOLN
200.0000 mg | Freq: Once | INTRAVENOUS | Status: AC
  Administered 2024-06-23: 200 mg via INTRAVENOUS
  Filled 2024-06-23: qty 10

## 2024-06-23 MED ORDER — ONDANSETRON HCL 4 MG/2ML IJ SOLN
4.0000 mg | Freq: Once | INTRAMUSCULAR | Status: AC
  Administered 2024-06-23: 4 mg via INTRAVENOUS
  Filled 2024-06-23: qty 2

## 2024-06-23 NOTE — Progress Notes (Signed)
 Fatigue/weakness: YES Dyspena: YES Light headedness: YES Blood in stool: no  Seen in [employer], NP [place] for neck pain. She had a nerve conduction and MRI.

## 2024-06-23 NOTE — Patient Instructions (Signed)

## 2024-06-23 NOTE — Assessment & Plan Note (Addendum)
#  Severe iron  deficiency-mild anemia; secondary to bariatric surgery.  Poor tolerance/response to oral iron . 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    # Hemoglobin - stable-but extreme fatigue.  SEP 2025- iron  saturation 18 % ferritin 121-patient feels improved after iron  infusion.proceed with  IV iron  infusion.   # # headaches [Dr.Shah]-  neck pain/chronic headaches- s/p  nerve conduction and MRI - awaiting ISI- awaiting neurosurgery-  # B12 def- continue on home b12 injection.   # DISPOSITION: #  venofer  today # follow up in 6 months- MD; prior 2-3 days- labs- cbc/bmp; Iron  studies; ferritin;b12 levels possible venofer - Dr.B
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
## 2024-07-04 ENCOUNTER — Other Ambulatory Visit: Payer: Self-pay | Admitting: Family Medicine

## 2024-07-06 NOTE — Telephone Encounter (Signed)
 Requested Prescriptions  Pending Prescriptions Disp Refills   spironolactone  (ALDACTONE ) 25 MG tablet [Pharmacy Med Name: Spironolactone  25 MG Oral Tablet] 90 tablet 0    Sig: Take 1 tablet by mouth once daily     Cardiovascular: Diuretics - Aldosterone Antagonist Failed - 07/06/2024 11:48 AM      Failed - Last BP in normal range    BP Readings from Last 1 Encounters:  06/23/24 (!) 147/96         Passed - Cr in normal range and within 180 days    Creatinine  Date Value Ref Range Status  06/17/2024 0.92 0.44 - 1.00 mg/dL Final  98/88/7983 9.13 0.60 - 1.30 mg/dL Final         Passed - K in normal range and within 180 days    Potassium  Date Value Ref Range Status  06/17/2024 3.9 3.5 - 5.1 mmol/L Final  10/10/2014 3.6 3.5 - 5.1 mmol/L Final         Passed - Na in normal range and within 180 days    Sodium  Date Value Ref Range Status  06/17/2024 135 135 - 145 mmol/L Final  01/26/2024 142 134 - 144 mmol/L Final  10/10/2014 140 136 - 145 mmol/L Final         Passed - eGFR is 30 or above and within 180 days    EGFR (African American)  Date Value Ref Range Status  10/10/2014 >60 >48mL/min Final  02/23/2014 >60  Final   GFR calc Af Amer  Date Value Ref Range Status  08/08/2020 91 >59 mL/min/1.73 Final    Comment:    **In accordance with recommendations from the NKF-ASN Task force,**   [facility] is in the process of updating its eGFR calculation to the   2021 CKD-EPI creatinine equation that estimates kidney function   without a race variable.    EGFR (Non-African Amer.)  Date Value Ref Range Status  10/10/2014 >60 >9mL/min Final    Comment:    eGFR values <57mL/min/1.73 m2 may be an indication of chronic kidney disease (CKD). Calculated eGFR, using the MRDR Study equation, is useful in  patients with stable renal function. The eGFR calculation will not be reliable in acutely ill patients when serum creatinine is changing rapidly. It is not useful in patients on  dialysis. The eGFR calculation may not be applicable to patients at the low and high extremes of body sizes, pregnant women, and vegetarians.   02/23/2014 >60  Final    Comment:    eGFR values <59mL/min/1.73 m2 may be an indication of chronic kidney disease (CKD). Calculated eGFR is useful in patients with stable renal function. The eGFR calculation will not be reliable in acutely ill patients when serum creatinine is changing rapidly. It is not useful in  patients on dialysis. The eGFR calculation may not be applicable to patients at the low and high extremes of body sizes, pregnant women, and vegetarians.    GFR, Estimated  Date Value Ref Range Status  06/17/2024 >60 >60 mL/min Final    Comment:    (NOTE) Calculated using the CKD-EPI Creatinine Equation (2021)    eGFR  Date Value Ref Range Status  01/26/2024 64 >59 mL/min/1.73 Final         Passed - Valid encounter within last 6 months    Recent Outpatient Visits           5 months ago Benign essential hypertension   [place] [facility] [place], Megan  P, DO   6 months ago Benign essential hypertension   [place] [facility] [place], [place], [place]

## 2024-07-08 NOTE — Discharge Instructions (Signed)

## 2024-07-09 ENCOUNTER — Inpatient Hospital Stay
Admission: RE | Admit: 2024-07-09 | Discharge: 2024-07-09 | Disposition: A | Discharge status: Home / Self Care | Source: Ambulatory Visit | Attending: Family Medicine | Admitting: Family Medicine

## 2024-07-14 ENCOUNTER — Ambulatory Visit: Admitting: Nurse Practitioner

## 2024-07-14 ENCOUNTER — Encounter: Payer: Self-pay | Admitting: Nurse Practitioner

## 2024-07-14 VITALS — BP 117/78 | HR 88 | Temp 98.1°F | Resp 15 | Ht 67.52 in | Wt 196.8 lb

## 2024-07-14 DIAGNOSIS — G43009 Migraine without aura, not intractable, without status migrainosus: Secondary | ICD-10-CM

## 2024-07-14 DIAGNOSIS — L0231 Cutaneous abscess of buttock: Secondary | ICD-10-CM | POA: Insufficient documentation

## 2024-07-14 MED ORDER — DOXYCYCLINE HYCLATE 100 MG PO TABS: 100.0000 mg | ORAL_TABLET | Freq: Two times a day (BID) | ORAL | 0 refills | Status: DC

## 2024-07-14 MED ORDER — KETOROLAC TROMETHAMINE 60 MG/2ML IM SOLN
60.0000 mg | Freq: Once | INTRAMUSCULAR | Status: AC
  Administered 2024-07-14: 60 mg via INTRAMUSCULAR

## 2024-07-14 MED ORDER — MUPIROCIN 2 % EX OINT: 1.0000 | TOPICAL_OINTMENT | Freq: Two times a day (BID) | CUTANEOUS | 0 refills | Status: DC

## 2024-07-14 NOTE — Progress Notes (Signed)
 BP 117/78 (BP Location: Left Arm, Patient Position: Sitting, Cuff Size: Large)   Pulse 88   Temp 98.1 F (36.7 C) (Oral)   Resp 15   Ht 5' 7.52 (1.715 m)   Wt 196 lb 12.8 oz (89.3 kg)   SpO2 98%   BMI 30.35 kg/m    Subjective:    Patient ID: Tina Harvey, female    DOB: 17-Apr-1971, 53 y.o.   MRN: [id number]  HPI: Tina Harvey is a 53 y.o. female  Chief Complaint  Patient presents with   Boil on bottom    Tried warm compresses. About a week maybe two.    SKIN INFECTION Has had a boil on her bottom for two weeks.  Has tried everything, feels like it is getting bigger. Duration: weeks Location: in upper right thigh crease History of trauma in area: no Pain: yes Quality: yes Severity: 4/10 Redness: yes Swelling: yes Oozing: no Pus: no Fevers: no Nausea/vomiting: no Status: worse Treatments attempted:warm compresses  Tetanus: UTD   Relevant past medical, surgical, family and social history reviewed and updated as indicated. Interim medical history since our last visit reviewed. Allergies and medications reviewed and updated.  Review of Systems  Constitutional:  Negative for activity change, appetite change, diaphoresis, fatigue and fever.  Respiratory:  Negative for cough, chest tightness, shortness of breath and wheezing.   Cardiovascular:  Negative for chest pain, palpitations and leg swelling.  Gastrointestinal: Negative.   Skin:  Positive for wound.  Neurological: Negative.   Psychiatric/Behavioral: Negative.      Per HPI unless specifically indicated above     Objective:    BP 117/78 (BP Location: Left Arm, Patient Position: Sitting, Cuff Size: Large)   Pulse 88   Temp 98.1 F (36.7 C) (Oral)   Resp 15   Ht 5' 7.52 (1.715 m)   Wt 196 lb 12.8 oz (89.3 kg)   SpO2 98%   BMI 30.35 kg/m   Wt Readings from Last 3 Encounters:  07/14/24 196 lb 12.8 oz (89.3 kg)  06/23/24 196 lb 8 oz (89.1 kg)  01/26/24 192 lb (87.1 kg)    Physical Exam Vitals and  nursing note reviewed.  Constitutional:      General: She is awake. She is not in acute distress.    Appearance: She is well-developed and well-groomed. She is obese. She is not ill-appearing or toxic-appearing.  HENT:     Head: Normocephalic.     Right Ear: Hearing and external ear normal.     Left Ear: Hearing and external ear normal.  Eyes:     General: Lids are normal.        Right eye: No discharge.        Left eye: No discharge.     Conjunctiva/sclera: Conjunctivae normal.     Pupils: Pupils are equal, round, and reactive to light.  Neck:     Thyroid : No thyromegaly.     Vascular: No carotid bruit.  Cardiovascular:     Rate and Rhythm: Normal rate and regular rhythm.     Heart sounds: Normal heart sounds. No murmur heard.    No gallop.  Pulmonary:     Effort: Pulmonary effort is normal. No accessory muscle usage or respiratory distress.     Breath sounds: Normal breath sounds.  Abdominal:     General: Bowel sounds are normal. There is no distension.     Palpations: Abdomen is soft.     Tenderness: There is no  abdominal tenderness.  Musculoskeletal:     Cervical back: Normal range of motion and neck supple.     Right lower leg: No edema.     Left lower leg: No edema.  Lymphadenopathy:     Cervical: No cervical adenopathy.  Skin:    General: Skin is warm and dry.     Findings: Abscess present.      Neurological:     Mental Status: She is alert and oriented to person, place, and time.     Deep Tendon Reflexes: Reflexes are normal and symmetric.     Reflex Scores:      Brachioradialis reflexes are 2+ on the right side and 2+ on the left side.      Patellar reflexes are 2+ on the right side and 2+ on the left side. Psychiatric:        Attention and Perception: Attention normal.        Mood and Affect: Mood normal.        Speech: Speech normal.        Behavior: Behavior normal. Behavior is cooperative.        Thought Content: Thought content normal.    Skin  Procedure  ABSCESS I&D Procedure: Left gluteus abscess I&D Description: After verbal consent and patient education on procedure area prepped and draped using semi-sterile technique. 5 cc Lidocaine  with epi placed for anesthesia. Spot testing done patient with no discomfort, then with a 15 blade area lanced with small incision and drained moderate amount sero-sang drainage. Steri strips applied to area and dressing applied. Tolerated procedure well. Complications:  none Post Procedure Instructions: To the ER if any symptoms of erythema or swelling.   Follow Up: PRN   Results for orders placed or performed in visit on 06/17/24  Vitamin B12   Collection Time: 06/17/24 11:15 AM  Result Value Ref Range   Vitamin B-12 587 180 - 914 pg/mL  Ferritin   Collection Time: 06/17/24 11:15 AM  Result Value Ref Range   Ferritin 121 11 - 307 ng/mL  Iron  and TIBC   Collection Time: 06/17/24 11:15 AM  Result Value Ref Range   Iron  71 28 - 170 ug/dL   TIBC 608 749 - 549 ug/dL   Saturation Ratios 18 10.4 - 31.8 %   UIBC 320 ug/dL  Basic Metabolic Panel - Cancer Center Only   Collection Time: 06/17/24 11:15 AM  Result Value Ref Range   Sodium 135 135 - 145 mmol/L   Potassium 3.9 3.5 - 5.1 mmol/L   Chloride 101 98 - 111 mmol/L   CO2 26 22 - 32 mmol/L   Glucose, Bld 81 70 - 99 mg/dL   BUN 14 6 - 20 mg/dL   Creatinine 9.07 9.55 - 1.00 mg/dL   Calcium  9.3 8.9 - 10.3 mg/dL   GFR, Estimated >39 >39 mL/min   Anion gap 8 5 - 15  CBC with Differential (Cancer Center Only)   Collection Time: 06/17/24 11:15 AM  Result Value Ref Range   WBC Count 7.8 4.0 - 10.5 K/uL   RBC 4.34 3.87 - 5.11 MIL/uL   Hemoglobin 12.6 12.0 - 15.0 g/dL   HCT 61.6 63.9 - 53.9 %   MCV 88.2 80.0 - 100.0 fL   MCH 29.0 26.0 - 34.0 pg   MCHC 32.9 30.0 - 36.0 g/dL   RDW 86.8 88.4 - 84.4 %   Platelet Count 277 150 - 400 K/uL   nRBC 0.0 0.0 - 0.2 %  Neutrophils Relative % 60 %   Neutro Abs 4.6 1.7 - 7.7 K/uL   Lymphocytes  Relative 28 %   Lymphs Abs 2.2 0.7 - 4.0 K/uL   Monocytes Relative 9 %   Monocytes Absolute 0.7 0.1 - 1.0 K/uL   Eosinophils Relative 2 %   Eosinophils Absolute 0.2 0.0 - 0.5 K/uL   Basophils Relative 1 %   Basophils Absolute 0.1 0.0 - 0.1 K/uL   Immature Granulocytes 0 %   Abs Immature Granulocytes 0.03 0.00 - 0.07 K/uL      Assessment & Plan:   Problem List Items Addressed This Visit       Cardiovascular and Mediastinum   Migraine without aura and without status migrainosus, not intractable   Chronic, ongoing with one triggered today due to abscess I&D.  She requested Toradol  in office, this was provided to assist with migraine.        Other   Abscess, gluteal, left - Primary   Acute for two weeks and not improving. I&D performed to area with moderate amount sero-sang drainage. Tolerated procedure well.  Will start Doxycycline  100 MG BID for 7 days and Mupirocin to wound.  Warm compresses as needed.  Alerted her it may drain further at home. To monitor area and if any worsening redness, warmth, or pain alert PCP immediately. Return in one week for wound check.        Follow up plan: Return in about 1 week (around 07/21/2024) for Wound check.

## 2024-07-14 NOTE — Assessment & Plan Note (Signed)
 Acute for two weeks and not improving. I&D performed to area with moderate amount sero-sang drainage. Tolerated procedure well.  Will start Doxycycline  100 MG BID for 7 days and Mupirocin to wound.  Warm compresses as needed.  Alerted her it may drain further at home. To monitor area and if any worsening redness, warmth, or pain alert PCP immediately. Return in one week for wound check.

## 2024-07-14 NOTE — Patient Instructions (Signed)
 Skin Abscess    A skin abscess is an infected spot of skin. It can have pus in it. An abscess can happen in any part of your body.  Some abscesses break open (rupture) on their own. Most keep getting worse unless they are treated. If your abscess is not treated, the infection can spread deeper into your body and blood. This can make you feel sick.  What are the causes?  Germs that enter your skin. This may happen if you have:  A cut or scrape.  A wound from a needle or an insect bite.  Blocked oil or sweat glands.  A problem with the spot where your hair goes into your skin.  A fluid-filled sac called a cyst under your skin.  What increases the risk?  Having problems with how your blood moves through your body.  Having a weak body defense system (immune system).  Having diabetes.  Having dry and irritated skin.  Needing to get shots often.  Putting drugs into your body with a needle.  Having a splinter or something else in your skin.  Smoking.  What are the signs or symptoms?  A firm bump under your skin that hurts.  A bump with pus at the top.  Redness and swelling.  Warm or tender spots.  A sore on the skin.  How is this treated?  You may need to:  Put a heat pack or a warm, wet washcloth on the spot.  Have the pus drained.  Take antibiotics.  Follow these instructions at home:  Medicines  Take over-the-counter and prescription medicines only as told by your doctor.  If you were prescribed antibiotics, take them as told by your doctor. Do not stop taking them even if you start to feel better.  Abscess care    If you have an abscess that has not drained, put heat on it. Use the heat source that your doctor recommends, such as a moist heat pack or a heating pad.  Place a towel between your skin and the heat source.  Leave the heat on for 20-30 minutes.  If your skin turns bright red, take off the heat right away to prevent burns. The risk of burns is higher if you cannot feel pain, heat, or cold.  Follow  instructions from your doctor about how to take care of your abscess. Make sure you:  Cover the abscess with a bandage.  Wash your hands with soap and water for at least 20 seconds before and after you change your bandage. If you cannot use soap and water, use hand sanitizer.  Change your bandage as told by your doctor.  Check your abscess every day for signs that the infection is getting worse. Check for:  More redness, swelling, or pain.  More fluid or blood.  Warmth.  More pus or a worse smell.  General instructions  To keep the infection from spreading:  Do not share personal items or towels.  Do not go in a hot tub with others.  Avoid making skin contact with others.  Be careful when you get rid of used bandages or any pus from the abscess.  Do not smoke or use any products that contain nicotine or tobacco. If you need help quitting, ask your doctor.  Contact a doctor if:  You see red streaks on your skin near the abscess.  You have any signs of worse infection.  You vomit every time you eat or drink.  You have  a fever, chills, or muscle aches.  The cyst or abscess comes back.  Get help right away if:  You have very bad pain.  You make less pee (urine) than normal.  This information is not intended to replace advice given to you by your health care provider. Make sure you discuss any questions you have with your health care provider.  Document Revised: 05/01/2022 Document Reviewed: 05/01/2022  Elsevier Patient Education  2024 [employer].

## 2024-07-14 NOTE — Discharge Instructions (Signed)

## 2024-07-14 NOTE — Assessment & Plan Note (Signed)
 Chronic, ongoing with one triggered today due to abscess I&D.  She requested Toradol  in office, this was provided to assist with migraine.

## 2024-07-15 ENCOUNTER — Ambulatory Visit
Admission: RE | Admit: 2024-07-15 | Discharge: 2024-07-15 | Disposition: A | Discharge status: Home / Self Care | Source: Ambulatory Visit | Attending: Family Medicine | Admitting: Family Medicine

## 2024-07-15 DIAGNOSIS — M5412 Radiculopathy, cervical region: Secondary | ICD-10-CM

## 2024-07-15 MED ORDER — TRIAMCINOLONE ACETONIDE 40 MG/ML IJ SUSP (RADIOLOGY)
60.0000 mg | Freq: Once | INTRAMUSCULAR | Status: AC
  Administered 2024-07-15: 60 mg via EPIDURAL

## 2024-07-15 MED ORDER — IOPAMIDOL (ISOVUE-300) INJECTION 61%
3.0000 mL | Freq: Once | INTRAVENOUS | Status: AC | PRN
  Administered 2024-07-15: 3 mL
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
## 2024-07-27 ENCOUNTER — Ambulatory Visit (INDEPENDENT_AMBULATORY_CARE_PROVIDER_SITE_OTHER)

## 2024-07-27 ENCOUNTER — Ambulatory Visit: Admitting: Neurosurgery

## 2024-07-27 ENCOUNTER — Encounter: Payer: Self-pay | Admitting: Family Medicine

## 2024-07-27 ENCOUNTER — Encounter: Payer: Self-pay | Admitting: Neurosurgery

## 2024-07-27 ENCOUNTER — Telehealth: Payer: Self-pay

## 2024-07-27 ENCOUNTER — Other Ambulatory Visit: Payer: Self-pay

## 2024-07-27 ENCOUNTER — Ambulatory Visit (INDEPENDENT_AMBULATORY_CARE_PROVIDER_SITE_OTHER): Admitting: Family Medicine

## 2024-07-27 VITALS — BP 142/94 | Ht 67.0 in | Wt 196.2 lb

## 2024-07-27 VITALS — BP 118/72 | HR 67 | Temp 98.0°F | Ht 67.0 in | Wt 196.6 lb

## 2024-07-27 DIAGNOSIS — M5412 Radiculopathy, cervical region: Secondary | ICD-10-CM | POA: Diagnosis not present

## 2024-07-27 DIAGNOSIS — I10 Essential (primary) hypertension: Secondary | ICD-10-CM

## 2024-07-27 DIAGNOSIS — E782 Mixed hyperlipidemia: Secondary | ICD-10-CM

## 2024-07-27 DIAGNOSIS — M109 Gout, unspecified: Secondary | ICD-10-CM

## 2024-07-27 DIAGNOSIS — M4802 Spinal stenosis, cervical region: Secondary | ICD-10-CM

## 2024-07-27 DIAGNOSIS — E611 Iron deficiency: Secondary | ICD-10-CM

## 2024-07-27 DIAGNOSIS — F32A Depression, unspecified: Secondary | ICD-10-CM

## 2024-07-27 DIAGNOSIS — Z8639 Personal history of other endocrine, nutritional and metabolic disease: Secondary | ICD-10-CM

## 2024-07-27 DIAGNOSIS — E538 Deficiency of other specified B group vitamins: Secondary | ICD-10-CM

## 2024-07-27 DIAGNOSIS — G43009 Migraine without aura, not intractable, without status migrainosus: Secondary | ICD-10-CM

## 2024-07-27 DIAGNOSIS — F419 Anxiety disorder, unspecified: Secondary | ICD-10-CM

## 2024-07-27 DIAGNOSIS — E559 Vitamin D deficiency, unspecified: Secondary | ICD-10-CM

## 2024-07-27 DIAGNOSIS — Z01818 Encounter for other preprocedural examination: Secondary | ICD-10-CM

## 2024-07-27 LAB — BAYER DCA HB A1C WAIVED: HB A1C (BAYER DCA - WAIVED): 5.6 % (ref 4.8–5.6)

## 2024-07-27 MED ORDER — ALBUTEROL SULFATE HFA 108 (90 BASE) MCG/ACT IN AERS: INHALATION_SPRAY | RESPIRATORY_TRACT | 6 refills | Status: AC

## 2024-07-27 MED ORDER — TRAZODONE HCL 50 MG PO TABS: 25.0000 mg | ORAL_TABLET | Freq: Every evening | ORAL | 3 refills | Status: DC | PRN

## 2024-07-27 MED ORDER — BUSPIRONE HCL 5 MG PO TABS: 5.0000 mg | ORAL_TABLET | Freq: Three times a day (TID) | ORAL | 3 refills | Status: AC

## 2024-07-27 MED ORDER — ESCITALOPRAM OXALATE 20 MG PO TABS: 20.0000 mg | ORAL_TABLET | Freq: Every day | ORAL | 1 refills | Status: DC

## 2024-07-27 MED ORDER — CYANOCOBALAMIN 1000 MCG/ML IJ SOLN: 1000.0000 ug | INTRAMUSCULAR | 6 refills | Status: DC

## 2024-07-27 MED ORDER — LISINOPRIL 20 MG PO TABS: 20.0000 mg | ORAL_TABLET | Freq: Every day | ORAL | 1 refills | Status: DC

## 2024-07-27 MED ORDER — SPIRONOLACTONE 25 MG PO TABS: 25.0000 mg | ORAL_TABLET | Freq: Every day | ORAL | 1 refills | Status: DC

## 2024-07-27 MED ORDER — TIZANIDINE HCL 4 MG PO CAPS: 4.0000 mg | ORAL_CAPSULE | Freq: Three times a day (TID) | ORAL | 1 refills | Status: DC

## 2024-07-27 MED ORDER — ATORVASTATIN CALCIUM 10 MG PO TABS: 10.0000 mg | ORAL_TABLET | Freq: Every day | ORAL | 1 refills | Status: DC

## 2024-07-27 MED ORDER — PANTOPRAZOLE SODIUM 40 MG PO TBEC: 40.0000 mg | DELAYED_RELEASE_TABLET | Freq: Every day | ORAL | 1 refills | Status: DC

## 2024-07-27 MED ORDER — FLUCONAZOLE 150 MG PO TABS: 150.0000 mg | ORAL_TABLET | Freq: Every day | ORAL | 0 refills | Status: DC

## 2024-07-27 NOTE — Patient Instructions (Signed)
 Please see below for information in regards to your upcoming surgery:   Planned surgery: C6-7 Arthroplasty    Surgery date: 08/20/24 at [facility]: [address] [place], [place] [address]) - you will find out your arrival time the business day before your surgery.    Pre-op appointment at [facility] Pre-admit Testing: you will receive a call with a date/time for this appointment. If you are scheduled for an in person appointment, Pre-admit Testing is located on the first floor of the [facility] building, [address], [address]. During this appointment, they will advise you which medications you can take the morning of surgery, and which medications you will need to hold for surgery. Labs (such as blood work, EKG) may be done at your pre-op appointment. You are not required to fast for these labs. Should you need to change your pre-op appointment, please call Pre-admit testing at [phone].      Common restrictions after spine surgery: No bending, lifting, or twisting ("BLT"). Avoid lifting objects heavier than 10 pounds for the first 6 weeks after surgery. Where possible, avoid household activities that involve lifting, bending, reaching, pushing, or pulling such as laundry, vacuuming, grocery shopping, and childcare. Try to arrange for help from friends and family for these activities while you heal. Do not drive while taking prescription pain medication. Weeks 6 through 12 after surgery: avoid lifting more than 25 pounds.     X-rays after surgery: Because you are having a fusion or arthroplasty: for appointments after your 2 week follow-up: please arrive our office 30 minutes prior to your appointment for x-rays. This applies to every appointment after your 2 week follow-up. Failure to do so may result in your appointment being rescheduled.     How to contact us :  If you have any questions/concerns before or after surgery,  you can reach us  at [phone], or you can send a mychart message. We can be reached by phone or mychart 8am-4pm, Monday-Friday.  *Please note: Calls after 4pm are forwarded to a third party answering service. Mychart messages are not routinely monitored during evenings, weekends, and holidays. Please call our office to contact the answering service for urgent concerns during non-business hours.     If you have FMLA/disability paperwork, please drop it off or fax it to [phone]   Appointments/FMLA & disability paperwork: Reche Hait, & Nichole Registered Nurse/Surgery scheduler: Kendelyn, RN & Katie, RN Certified Medical Assistants: Don, CMA, Elenor, CMA, Damien, CMA, & Auston, [place] Physician Assistants: Lyle Decamp, PA-C, Edsel Goods, PA-C & Glade Boys, PA-C Surgeons: Penne Sharps, MD & Reeves Daisy, MD     [facility] PREADMIT TESTING VISIT and SURGERY INFORMATION SHEET   Now that surgery has been scheduled you can anticipate several phone calls from [facility] services. A pharmacy technician will call you to verify your current list of medications taken at home.               The Pre-Service Center will call to verify your insurance information and to give you billing estimates and information.             The Preadmit Testing Office will be calling to schedule a visit to obtain information for the anesthesia team and provide instructions on preparation for surgery.  What can you expect for the Preadmit Testing Visit: Appointments may be scheduled in-person or by telephone.  If a telephone visit is scheduled, you may be asked  to come into the office to have lab tests or other studies performed.   This visit will not be completed any greater than 14 days prior to your surgery.  If your surgery has been scheduled for a future date, please do not be alarmed if we have not contacted you to schedule an appointment more than a month prior to the  surgery date.    Please be prepared to provide the following information during this appointment:            -Personal medical history                                               -Medication and allergy list            -Any history of problems with anesthesia              -Recent lab work or diagnostic studies            -Please notify us  of any needs we should be aware of to provide the best care possible           -You will be provided with instructions on how to prepare for your surgery.    On The Day of Surgery:  You must have a driver to take you home after surgery, you will be asked not to drive for 24 hours following surgery.  Taxi, Gisele and non-medical transport will not be acceptable means of transportation unless you have a responsible individual who will be traveling with you.  Visitors in the surgical area:   2 people will be able to visit you in your room once your preparation for surgery has been completed. During surgery, your visitors will be asked to wait in the Surgery Waiting Area.  It is not a requirement for them to stay, if they prefer to leave and come back.  Your visitor(s) will be given an update once the surgery has been completed.  No visitors are allowed in the initial recovery room to respect patient privacy and safety.  Once you are more awake and transfer to the secondary recovery area, or are transferred to an inpatient room, visitors will again be able to see you.  To respect and protect your privacy: We will ask on the day of surgery who your driver will be and what the contact number for that individual will be. We will ask if it is okay to share information with this individual, or if there is an alternative individual that we, or the surgeon, should contact to provide updates and information. If family or friends come to the surgical information desk requesting information about you, who you have not listed with us , no information will be given.   It  may be helpful to designate someone as the main contact who will be responsible for updating your other friends and family.    PREADMIT TESTING OFFICE: [phone] SAME DAY SURGERY: [phone] We look forward to caring for you before and throughout the process of your surgery.

## 2024-07-27 NOTE — Assessment & Plan Note (Signed)
 Rechecking labs today. Await results. Treat as needed.

## 2024-07-27 NOTE — Assessment & Plan Note (Signed)
 Under good control on current regimen. Continue current regimen. Continue to monitor. Call with any concerns. Refills given. Labs drawn today.

## 2024-07-27 NOTE — Assessment & Plan Note (Signed)
 Under good control on current regimen. Continue current regimen. Continue to monitor. Call with any concerns. Refills given.

## 2024-07-27 NOTE — Telephone Encounter (Signed)
 I contacted [employer] [name] to request authorization for the C6-7 arthroplasty (CPT [phone]) and was informed that this code does not require prior authorization because it is a policy exclusion. Because of this, the surgery plan has been changed to a C6-7 anterior cervical discectomy and fusion. Dr Clois discussed the option of a fusion with Tina Harvey while she was in the office.   Per discussion with Dr Clois, we will switch the surgery plan from an arthroplasty to a fusion. I have discussed this with Tina Harvey and she has agreed.   I did inform her that by making this change, we would add restrictions regarding NSAIDS for 3 months after surgery (she can continue to take them until surgery).  NSAIDS (Non-steroidal anti-inflammatory drugs): because you are having a fusion, please avoid taking any NSAIDS (examples: ibuprofen , motrin , aleve, naproxen, meloxicam , diclofenac ) for 3 months after surgery. Celebrex is an exception and is OK to take, if prescribed. Tylenol  is not an NSAID.  I have sent her updated instructions via mychart.

## 2024-07-27 NOTE — Progress Notes (Signed)
 BP 118/72   Pulse 67   Temp 98 F (36.7 C) (Oral)   Ht 5' 7 (1.702 m)   Wt 196 lb 9.6 oz (89.2 kg)   SpO2 97%   BMI 30.79 kg/m    Subjective:    Patient ID: Tina Harvey, female    DOB: 1970/12/20, 53 y.o.   MRN: [id number]  HPI: [name] is a 53 y.o. female  Chief Complaint  Patient presents with   Hypertension   HYPERTENSION / HYPERLIPIDEMIA Satisfied with current treatment? yes Duration of hypertension: chronic BP monitoring frequency: rarely BP medication side effects: no Past BP meds: lisinopril , spironalactone Duration of hyperlipidemia: chronic Cholesterol medication side effects: no Cholesterol supplements: none Past cholesterol medications: atorvastatin  Medication compliance: excellent compliance Aspirin: no Recent stressors: no Recurrent headaches: no Visual changes: no Palpitations: no Dyspnea: no Chest pain: no Lower extremity edema: no Dizzy/lightheaded: no  ANXIETY/DEPRESSION Duration: chronic Status:stable Anxious mood: no  Excessive worrying: no Irritability: no  Sweating: no Nausea: no Palpitations:no Hyperventilation: no Panic attacks: no Agoraphobia: no  Obscessions/compulsions: no Depressed mood: no    06/23/2024   10:35 AM 07/08/2023   10:01 AM 08/07/2022    9:22 AM 06/13/2022    8:47 AM 04/01/2022    8:54 AM  Depression screen PHQ 2/9  Decreased Interest 0 0 0 0 0  Down, Depressed, Hopeless 0 0 0 0 0  PHQ - 2 Score 0 0 0 0 0  Altered sleeping  3 3  0  Tired, decreased energy  3 3  0  Change in appetite  0 0  0  Feeling bad or failure about yourself   0 0  0  Trouble concentrating  0 0  0  Moving slowly or fidgety/restless  0 0  0  Suicidal thoughts  0 0  0  PHQ-9 Score  6 6  0  Difficult doing work/chores  Very difficult Very difficult  Not difficult at all   Anhedonia: no Weight changes: no Insomnia: no   Hypersomnia: no Fatigue/loss of energy: no Feelings of worthlessness: no Feelings of guilt: no Impaired  concentration/indecisiveness: no Suicidal ideations: no  Crying spells: no Recent Stressors/Life Changes: no   Relationship problems: no   Family stress: no     Financial stress: no    Job stress: no    Recent death/loss: yes  Relevant past medical, surgical, family and social history reviewed and updated as indicated. Interim medical history since our last visit reviewed. Allergies and medications reviewed and updated.  Review of Systems  Constitutional: Negative.   Respiratory: Negative.    Cardiovascular: Negative.   Gastrointestinal: Negative.   Genitourinary: Negative.   Musculoskeletal:  Positive for myalgias, neck pain and neck stiffness. Negative for arthralgias, back pain, gait problem and joint swelling.  Neurological: Negative.   Psychiatric/Behavioral:  Positive for dysphoric mood and sleep disturbance. Negative for agitation, behavioral problems, confusion, decreased concentration, hallucinations, self-injury and suicidal ideas. The patient is nervous/anxious. The patient is not hyperactive.     Per HPI unless specifically indicated above     Objective:    BP 118/72   Pulse 67   Temp 98 F (36.7 C) (Oral)   Ht 5' 7 (1.702 m)   Wt 196 lb 9.6 oz (89.2 kg)   SpO2 97%   BMI 30.79 kg/m   Wt Readings from Last 3 Encounters:  07/27/24 196 lb 9.6 oz (89.2 kg)  07/27/24 196 lb 4 oz (89 kg)  07/14/24 196 lb 12.8 oz (89.3 kg)    Physical Exam Vitals and nursing note reviewed.  Constitutional:      General: She is not in acute distress.    Appearance: Normal appearance. She is obese. She is not ill-appearing, toxic-appearing or diaphoretic.  HENT:     Head: Normocephalic and atraumatic.     Right Ear: External ear normal.     Left Ear: External ear normal.     Nose: Nose normal.     Mouth/Throat:     Mouth: Mucous membranes are moist.     Pharynx: Oropharynx is clear.  Eyes:     General: No scleral icterus.       Right eye: No discharge.        Left eye:  No discharge.     Extraocular Movements: Extraocular movements intact.     Conjunctiva/sclera: Conjunctivae normal.     Pupils: Pupils are equal, round, and reactive to light.  Cardiovascular:     Rate and Rhythm: Normal rate and regular rhythm.     Pulses: Normal pulses.     Heart sounds: Normal heart sounds. No murmur heard.    No friction rub. No gallop.  Pulmonary:     Effort: Pulmonary effort is normal. No respiratory distress.     Breath sounds: Normal breath sounds. No stridor. No wheezing, rhonchi or rales.  Chest:     Chest wall: No tenderness.  Musculoskeletal:        General: Normal range of motion.     Cervical back: Normal range of motion and neck supple.  Skin:    General: Skin is warm and dry.     Capillary Refill: Capillary refill takes less than 2 seconds.     Coloration: Skin is not jaundiced or pale.     Findings: No bruising, erythema, lesion or rash.  Neurological:     General: No focal deficit present.     Mental Status: She is alert and oriented to person, place, and time. Mental status is at baseline.  Psychiatric:        Mood and Affect: Mood normal.        Behavior: Behavior normal.        Thought Content: Thought content normal.        Judgment: Judgment normal.     Results for orders placed or performed in visit on 07/27/24  Bayer DCA Hb A1c Waived   Collection Time: 07/27/24 10:15 AM  Result Value Ref Range   HB A1C (BAYER DCA - WAIVED) 5.6 4.8 - 5.6 %      Assessment & Plan:   Problem List Items Addressed This Visit       Cardiovascular and Mediastinum   Migraine without aura and without status migrainosus, not intractable   Under good control on current regimen. Continue current regimen. Continue to monitor. Call with any concerns. Refills given. Labs drawn today.        Relevant Medications   atorvastatin  (LIPITOR) 10 MG tablet   escitalopram  (LEXAPRO ) 20 MG tablet   lisinopril  (ZESTRIL ) 20 MG tablet   spironolactone  (ALDACTONE )  25 MG tablet   traZODone (DESYREL) 50 MG tablet   tiZANidine (ZANAFLEX) 4 MG capsule   Other Relevant Orders   AMB Referral [facility] Care Management   Benign essential hypertension   Under good control on current regimen. Continue current regimen. Continue to monitor. Call with any concerns. Refills given. Labs drawn today.       Relevant  Medications   atorvastatin  (LIPITOR) 10 MG tablet   lisinopril  (ZESTRIL ) 20 MG tablet   spironolactone  (ALDACTONE ) 25 MG tablet   Other Relevant Orders   CBC with Differential/Platelet   Comprehensive metabolic panel with GFR     Other   Hyperlipidemia - Primary   Under good control on current regimen. Continue current regimen. Continue to monitor. Call with any concerns. Refills given. Labs drawn today.       Relevant Medications   atorvastatin  (LIPITOR) 10 MG tablet   lisinopril  (ZESTRIL ) 20 MG tablet   spironolactone  (ALDACTONE ) 25 MG tablet   Other Relevant Orders   CBC with Differential/Platelet   Comprehensive metabolic panel with GFR   Lipid Panel w/o Chol/HDL Ratio   Gout   Under good control on current regimen. Continue current regimen. Continue to monitor. Call with any concerns. Refills given. Labs drawn today.       Relevant Orders   CBC with Differential/Platelet   Comprehensive metabolic panel with GFR   Uric acid   Vitamin D  deficiency   Rechecking labs today. Await results. Treat as needed.       Relevant Orders   CBC with Differential/Platelet   Comprehensive metabolic panel with GFR   VITAMIN D  25 Hydroxy (Vit-D Deficiency, Fractures)   B12 deficiency   Rechecking labs today. Await results. Treat as needed.       Relevant Orders   CBC with Differential/Platelet   Comprehensive metabolic panel with GFR   B12   Anxiety and depression   Under good control on current regimen. Continue current regimen. Continue to monitor. Call with any concerns. Refills given.        Relevant Medications   busPIRone  (BUSPAR ) 5 MG  tablet   escitalopram  (LEXAPRO ) 20 MG tablet   traZODone (DESYREL) 50 MG tablet   Other Relevant Orders   CBC with Differential/Platelet   Comprehensive metabolic panel with GFR   H/O diabetes mellitus   Rechecking labs today. Await results. Treat as needed.       Relevant Orders   CBC with Differential/Platelet   Bayer DCA Hb A1c Waived (Completed)   Comprehensive metabolic panel with GFR   Iron  deficiency   Rechecking labs today. Await results. Treat as needed.       Relevant Orders   CBC with Differential/Platelet   Comprehensive metabolic panel with GFR   Iron  Binding Cap (TIBC)([facility])   Ferritin     Follow up plan: Return in about 6 months (around 01/25/2025) for physical.

## 2024-07-27 NOTE — Telephone Encounter (Signed)
 Updated information in regards to upcoming surgery:     Planned surgery: C6-7 anterior cervical discectomy and fusion      Surgery date: 08/20/24 at [facility]: [address], [place], [place] [address]) - you will find out your arrival time the business day before your surgery.       Pre-op appointment at [facility] Pre-admit Testing: you will receive a call with a date/time for this appointment. If you are scheduled for an in person appointment, Pre-admit Testing is located on the first floor of the [facility] building, [address], [address]. During this appointment, they will advise you which medications you can take the morning of surgery, and which medications you will need to hold for surgery. Labs (such as blood work, EKG) may be done at your pre-op appointment. You are not required to fast for these labs. Should you need to change your pre-op appointment, please call Pre-admit testing at [phone].    No need to hold Boniva  or Emgality  for an extended time before/after surgery - follow instructions given by pre-admit testing for these medications.    NSAIDS (Non-steroidal anti-inflammatory drugs): because you are having a fusion, please avoid taking any NSAIDS (examples: ibuprofen , motrin , aleve, naproxen, meloxicam , diclofenac ) for 3 months after surgery. Celebrex is an exception and is OK to take, if prescribed. Tylenol  is not an NSAID.      Common restrictions after spine surgery: No bending, lifting, or twisting ("BLT"). Avoid lifting objects heavier than 10 pounds for the first 6 weeks after surgery. Where possible, avoid household activities that involve lifting, bending, reaching, pushing, or pulling such as laundry, vacuuming, grocery shopping, and childcare. Try to arrange for help from friends and family for these activities while you heal. Do not drive while taking prescription pain medication. Weeks 6 through 12  after surgery: avoid lifting more than 25 pounds.        X-rays after surgery: Because you are having a fusion or arthroplasty: for appointments after your 2 week follow-up: please arrive our office 30 minutes prior to your appointment for x-rays. This applies to every appointment after your 2 week follow-up. Failure to do so may result in your appointment being rescheduled.        How to contact us :  If you have any questions/concerns before or after surgery, you can reach us  at [phone], or you can send a mychart message. We can be reached by phone or mychart 8am-4pm, Monday-Friday.  *Please note: Calls after 4pm are forwarded to a third party answering service. Mychart messages are not routinely monitored during evenings, weekends, and holidays. Please call our office to contact the answering service for urgent concerns during non-business hours.         If you have FMLA/disability paperwork, please drop it off or fax it to [phone]     Appointments/FMLA & disability paperwork: Reche Hait, & Nichole Registered Nurse/Surgery scheduler: Taletha Twiford, RN & Katie, RN Certified Medical Assistants: Don, CMA, Elenor, CMA, Damien, CMA, & Auston, [place] Physician Assistants: Lyle Decamp, PA-C, Edsel Goods, PA-C & Glade Boys, PA-C Surgeons: Penne Sharps, MD & Reeves Daisy, MD         [facility] PREADMIT TESTING VISIT and SURGERY INFORMATION SHEET    Now that surgery has been scheduled you can anticipate several phone calls from [facility] services. A pharmacy technician will call you to verify your current list of medications taken at home.  The Pre-Service Center will call to verify your insurance information and to give you billing estimates and information.             The Preadmit Testing Office will be calling to schedule a visit to obtain information for the anesthesia team and provide instructions on preparation for surgery.    What can you expect for the Preadmit Testing Visit: Appointments may be scheduled in-person or by telephone.  If a telephone visit is scheduled, you may be asked to come into the office to have lab tests or other studies performed.   This visit will not be completed any greater than 14 days prior to your surgery.  If your surgery has been scheduled for a future date, please do not be alarmed if we have not contacted you to schedule an appointment more than a month prior to the surgery date.     Please be prepared to provide the following information during this appointment:            -Personal medical history                                               -Medication and allergy list            -Any history of problems with anesthesia              -Recent lab work or diagnostic studies            -Please notify us  of any needs we should be aware of to provide the best care possible           -You will be provided with instructions on how to prepare for your surgery.       On The Day of Surgery:   You must have a driver to take you home after surgery, you will be asked not to drive for 24 hours following surgery.  Taxi, Gisele and non-medical transport will not be acceptable means of transportation unless you have a responsible individual who will be traveling with you.   Visitors in the surgical area:   2 people will be able to visit you in your room once your preparation for surgery has been completed. During surgery, your visitors will be asked to wait in the Surgery Waiting Area.  It is not a requirement for them to stay, if they prefer to leave and come back.  Your visitor(s) will be given an update once the surgery has been completed.   No visitors are allowed in the initial recovery room to respect patient privacy and safety.   Once you are more awake and transfer to the secondary recovery area, or are transferred to an inpatient room, visitors will again be able to see you.   To respect  and protect your privacy: We will ask on the day of surgery who your driver will be and what the contact number for that individual will be. We will ask if it is okay to share information with this individual, or if there is an alternative individual that we, or the surgeon, should contact to provide updates and information. If family or friends come to the surgical information desk requesting information about you, who you have not listed with us , no information will be given.   It may be helpful to designate someone as the main  contact who will be responsible for updating your other friends and family.     PREADMIT TESTING OFFICE: [phone] SAME DAY SURGERY: [phone] We look forward to caring for you before and throughout the process of your surgery.

## 2024-07-28 ENCOUNTER — Telehealth: Payer: Self-pay

## 2024-07-28 LAB — CBC WITH DIFFERENTIAL/PLATELET
Basophils Absolute: 0 x10E3/uL (ref 0.0–0.2)
Basos: 0 %
EOS (ABSOLUTE): 0.1 x10E3/uL (ref 0.0–0.4)
Eos: 1 %
Hematocrit: 42.8 % (ref 34.0–46.6)
Hemoglobin: 14.1 g/dL (ref 11.1–15.9)
Immature Grans (Abs): 0 x10E3/uL (ref 0.0–0.1)
Immature Granulocytes: 0 %
Lymphocytes Absolute: 3 x10E3/uL (ref 0.7–3.1)
Lymphs: 25 %
MCH: 29.8 pg (ref 26.6–33.0)
MCHC: 32.9 g/dL (ref 31.5–35.7)
MCV: 91 fL (ref 79–97)
Monocytes Absolute: 1 x10E3/uL — ABNORMAL HIGH (ref 0.1–0.9)
Monocytes: 9 %
Neutrophils Absolute: 7.5 x10E3/uL — ABNORMAL HIGH (ref 1.4–7.0)
Neutrophils: 65 %
Platelets: 343 x10E3/uL (ref 150–450)
RBC: 4.73 x10E6/uL (ref 3.77–5.28)
RDW: 13.2 % (ref 11.7–15.4)
WBC: 11.7 x10E3/uL — ABNORMAL HIGH (ref 3.4–10.8)

## 2024-07-28 LAB — IRON AND TIBC
Iron Saturation: 21 % (ref 15–55)
Iron: 79 ug/dL (ref 27–159)
Total Iron Binding Capacity: 368 ug/dL (ref 250–450)
UIBC: 289 ug/dL (ref 131–425)

## 2024-07-28 LAB — COMPREHENSIVE METABOLIC PANEL WITH GFR
ALT: 19 IU/L (ref 0–32)
AST: 16 IU/L (ref 0–40)
Albumin: 4.4 g/dL (ref 3.8–4.9)
Alkaline Phosphatase: 86 IU/L (ref 49–135)
BUN/Creatinine Ratio: 18 (ref 9–23)
BUN: 16 mg/dL (ref 6–24)
Bilirubin Total: 0.3 mg/dL (ref 0.0–1.2)
CO2: 24 mmol/L (ref 20–29)
Calcium: 10.2 mg/dL (ref 8.7–10.2)
Chloride: 101 mmol/L (ref 96–106)
Creatinine, Ser: 0.87 mg/dL (ref 0.57–1.00)
Globulin, Total: 2.7 g/dL (ref 1.5–4.5)
Glucose: 48 mg/dL — ABNORMAL LOW (ref 70–99)
Potassium: 3.7 mmol/L (ref 3.5–5.2)
Sodium: 141 mmol/L (ref 134–144)
Total Protein: 7.1 g/dL (ref 6.0–8.5)
eGFR: 80 mL/min/1.73 (ref >59)

## 2024-07-28 LAB — LIPID PANEL W/O CHOL/HDL RATIO
Cholesterol, Total: 267 mg/dL — ABNORMAL HIGH (ref 100–199)
HDL: 57 mg/dL (ref >39)
LDL Chol Calc (NIH): 168 mg/dL — ABNORMAL HIGH (ref 0–99)
Triglycerides: 224 mg/dL — ABNORMAL HIGH (ref 0–149)
VLDL Cholesterol Cal: 42 mg/dL — ABNORMAL HIGH (ref 5–40)

## 2024-07-28 LAB — URIC ACID: Uric Acid: 4.9 mg/dL (ref 3.0–7.2)

## 2024-07-28 LAB — VITAMIN D 25 HYDROXY (VIT D DEFICIENCY, FRACTURES): Vit D, 25-Hydroxy: 40.3 ng/mL (ref 30.0–100.0)

## 2024-07-28 LAB — FERRITIN: Ferritin: 277 ng/mL — ABNORMAL HIGH (ref 15–150)

## 2024-07-28 LAB — VITAMIN B12: Vitamin B-12: 687 pg/mL (ref 232–1245)

## 2024-07-28 NOTE — Progress Notes (Signed)
 Care Guide Pharmacy Note  07/28/2024 Name: Tina Harvey MRN: [id number] DOB: 05/17/1971  Referred By: Vicci Duwaine SQUIBB, DO Reason for referral: Complex Care Management (Outreach to schedule with Pharm d )   Tina Harvey is a 53 y.o. year old female who is a primary care patient of Vicci Duwaine SQUIBB, DO.  Tina Harvey was referred to the pharmacist for assistance related to: migraine   Successful contact was made with the patient to discuss pharmacy services including being ready for the pharmacist to call at least 5 minutes before the scheduled appointment time and to have medication bottles and any blood pressure readings ready for review. The patient agreed to meet with the pharmacist via telephone visit on (date/time).08/06/2024  Jeoffrey Buffalo , RMA       [facility], [facility] Guide  Direct Dial: [phone]  Website: [email or web address]

## 2024-07-30 ENCOUNTER — Ambulatory Visit: Payer: Self-pay | Admitting: Family Medicine

## 2024-07-30 MED ORDER — ATORVASTATIN CALCIUM 20 MG PO TABS: 20.0000 mg | ORAL_TABLET | Freq: Every day | ORAL | 1 refills | Status: DC

## 2024-08-06 ENCOUNTER — Other Ambulatory Visit: Payer: Self-pay

## 2024-08-06 ENCOUNTER — Encounter
Admission: RE | Admit: 2024-08-06 | Discharge: 2024-08-06 | Disposition: A | Discharge status: Home / Self Care | Source: Ambulatory Visit | Attending: Neurosurgery | Admitting: Neurosurgery

## 2024-08-06 DIAGNOSIS — G43009 Migraine without aura, not intractable, without status migrainosus: Secondary | ICD-10-CM

## 2024-08-06 DIAGNOSIS — Z01818 Encounter for other preprocedural examination: Secondary | ICD-10-CM | POA: Insufficient documentation

## 2024-08-06 DIAGNOSIS — Z01810 Encounter for preprocedural cardiovascular examination: Secondary | ICD-10-CM | POA: Diagnosis not present

## 2024-08-06 DIAGNOSIS — I10 Essential (primary) hypertension: Secondary | ICD-10-CM

## 2024-08-06 LAB — TYPE AND SCREEN
ABO/RH(D): A POS
Antibody Screen: NEGATIVE

## 2024-08-06 LAB — SURGICAL PCR SCREEN
MRSA, PCR: NEGATIVE
Staphylococcus aureus: NEGATIVE

## 2024-08-06 NOTE — Patient Instructions (Signed)
 Your procedure is scheduled on: FRIDAY 08/20/24 Report to the Registration Desk on the 1st floor of the [facility]. To find out your arrival time, please call [phone] between 1PM - 3PM on: THURSDAY 08/19/24 If your arrival time is 6:00 am, do not arrive before that time as the [facility] entrance doors do not open until 6:00 am.  REMEMBER: Instructions that are not followed completely may result in serious medical risk, up to and including death; or upon the discretion of your surgeon and anesthesiologist your surgery may need to be rescheduled.  Do not eat food after midnight the night before surgery.  No gum chewing or hard candies.  You may however, drink CLEAR liquids up to 2 hours before you are scheduled to arrive for your surgery. Do not drink anything within 2 hours of your scheduled arrival time.  Clear liquids include: - water   - apple juice without pulp - gatorade (not RED colors) - black coffee or tea (Do NOT add milk or creamers to the coffee or tea) Do NOT drink anything that is not on this list.  Stop ANY OVER THE COUNTER supplements until after surgery.  You may however, continue to take Tylenol  if needed for pain up until the day of surgery.  Continue taking all of your other prescription medications up until the day of surgery.  ON THE DAY OF SURGERY ONLY TAKE THESE MEDICATIONS WITH SIPS OF WATER :  atorvastatin  (LIPITOR)  escitalopram  (LEXAPRO )  pantoprazole  (PROTONIX )   Use inhalers on the day of surgery and bring to the hospital.  No Alcohol for 24 hours before or after surgery.  No Smoking including e-cigarettes for 24 hours before surgery.  No chewable tobacco products for at least 6 hours before surgery.  No nicotine patches on the day of surgery.  Do not use any recreational drugs for at least a week (preferably 2 weeks) before your surgery.  Please be advised that the combination of cocaine and anesthesia may have negative outcomes, up to  and including death. If you test positive for cocaine, your surgery will be cancelled.  On the morning of surgery brush your teeth with toothpaste and water , you may rinse your mouth with mouthwash if you wish. Do not swallow any toothpaste or mouthwash.  Use CHG Soap or wipes as directed on instruction sheet.  Do not wear jewelry, make-up, hairpins, clips or nail polish.  For welded (permanent) jewelry: bracelets, anklets, waist bands, etc.  Please have this removed prior to surgery.  If it is not removed, there is a chance that hospital personnel will need to cut it off on the day of surgery.  Do not wear lotions, powders, or perfumes.   Do not shave body hair from the neck down 48 hours before surgery.  Contact lenses, hearing aids and dentures may not be worn into surgery.  Do not bring valuables to the hospital. [facility] is not responsible for any missing/lost belongings or valuables.   Bring your C-PAP to the hospital in case you may have to spend the night.   Notify your doctor if there is any change in your medical condition (cold, fever, infection).  Wear comfortable clothing (specific to your surgery type) to the hospital.  After surgery, you can help prevent lung complications by doing breathing exercises.  Take deep breaths and cough every 1-2 hours. Your doctor may order a device called an Incentive Spirometer to help you take deep breaths.  If you are being admitted  to the hospital overnight, leave your suitcase in the car. After surgery it may be brought to your room.  In case of increased patient census, it may be necessary for you, the patient, to continue your postoperative care in the Same Day Surgery department.  If you are being discharged the day of surgery, you will not be allowed to drive home. You will need a responsible individual to drive you home and stay with you for 24 hours after surgery.   Surgery Visitation Policy:  Patients having surgery or  a procedure may have two visitors.  Children under the age of 30 must have an adult with them who is not the patient.  Inpatient Visitation:    Visiting hours are 7 a.m. to 8 p.m. Up to four visitors are allowed at one time in a patient room. The visitors may rotate out with other people during the day.  One visitor age 53 or older may stay with the patient overnight and must be in the room by 8 p.m.  Merchandiser, Retail to address health-related social needs:  [email or web address]  Please call the Pre-admissions Testing Dept. at [phone] if you have any questions about these instructions.    Pre-operative 4 CHG Bath Instructions   You can play a key role in reducing the risk of infection after surgery. Your skin needs to be as free of germs as possible. You can reduce the number of germs on your skin by washing with CHG (chlorhexidine gluconate) soap before surgery. CHG is an antiseptic soap that kills germs and continues to kill germs even after washing.   DO NOT use if you have an allergy to chlorhexidine/CHG or antibacterial soaps. If your skin becomes reddened or irritated, stop using the CHG and notify one of our RNs at [phone].   Please shower with the CHG soap starting 4 days before surgery using the following schedule:     Please keep in mind the following:  DO NOT shave, including legs and underarms, starting the day of your first shower.   You may shave your face at any point before/day of surgery.  Place clean sheets on your bed the day you start using CHG soap. Use a clean washcloth (not used since being washed) for each shower. DO NOT sleep with pets once you start using the CHG.   CHG Shower Instructions:  If you choose to wash your hair and private area, wash first with your normal shampoo/soap.  After you use shampoo/soap, rinse your hair and body thoroughly to remove shampoo/soap residue.  Turn the water  OFF and apply about 3  tablespoons (45 ml) of CHG soap to a CLEAN washcloth.  Apply CHG soap ONLY FROM YOUR NECK DOWN TO YOUR TOES (washing for 3-5 minutes)  DO NOT use CHG soap on face, private areas, open wounds, or sores.  Pay special attention to the area where your surgery is being performed.  If you are having back surgery, having someone wash your back for you may be helpful. Wait 2 minutes after CHG soap is applied, then you may rinse off the CHG soap.  Pat dry with a clean towel  Put on clean clothes/pajamas   If you choose to wear lotion, please use ONLY the CHG-compatible lotions on the back of this paper.     Additional instructions for the day of surgery: DO NOT APPLY any lotions, deodorants, cologne, or perfumes.   Put on clean/comfortable clothes.  Brush your teeth.  Ask your  nurse before applying any prescription medications to the skin.      CHG Compatible Lotions   Aveeno Moisturizing lotion  Cetaphil Moisturizing Cream  Cetaphil Moisturizing Lotion  Clairol Herbal Essence Moisturizing Lotion, Dry Skin  Clairol Herbal Essence Moisturizing Lotion, Extra Dry Skin  Clairol Herbal Essence Moisturizing Lotion, Normal Skin  Curel Age Defying Therapeutic Moisturizing Lotion with Alpha Hydroxy  Curel Extreme Care Body Lotion  Curel Soothing Hands Moisturizing Hand Lotion  Curel Therapeutic Moisturizing Cream, Fragrance-Free  Curel Therapeutic Moisturizing Lotion, Fragrance-Free  Curel Therapeutic Moisturizing Lotion, Original Formula  Eucerin Daily Replenishing Lotion  Eucerin Dry Skin Therapy Plus Alpha Hydroxy Crme  Eucerin Dry Skin Therapy Plus Alpha Hydroxy Lotion  Eucerin Original Crme  Eucerin Original Lotion  Eucerin Plus Crme Eucerin Plus Lotion  Eucerin TriLipid Replenishing Lotion  Keri Anti-Bacterial Hand Lotion  Keri Deep Conditioning Original Lotion Dry Skin Formula Softly Scented  Keri Deep Conditioning Original Lotion, Fragrance Free Sensitive Skin Formula  Keri  Lotion Fast Absorbing Fragrance Free Sensitive Skin Formula  Keri Lotion Fast Absorbing Softly Scented Dry Skin Formula  Keri Original Lotion  Keri Skin Renewal Lotion Keri Silky Smooth Lotion  Keri Silky Smooth Sensitive Skin Lotion  Nivea Body Creamy Conditioning Oil  Nivea Body Extra Enriched Teacher, Adult Education Moisturizing Lotion Nivea Crme  Nivea Skin Firming Lotion  NutraDerm 30 Skin Lotion  NutraDerm Skin Lotion  NutraDerm Therapeutic Skin Cream  NutraDerm Therapeutic Skin Lotion  ProShield Protective Hand Cream  Provon moisturizing lotion

## 2024-08-06 NOTE — Progress Notes (Signed)
 08/06/2024 Name: Tina Harvey MRN: [id number] DOB: 30-Nov-1970  Chief Complaint  Patient presents with   Medication Assistance   Tina Harvey is a 53 y.o. year old female who presented for a telephone visit.   They were referred to the pharmacist by their PCP for assistance in managing medication access.    Subjective:  Care Team: Primary Care Provider: Vicci Duwaine SQUIBB, DO ; Next Scheduled Visit: 10/27/24  Medication Access/Adherence  Current Pharmacy:  [facility] [address], [place] - [address] [place] [name] [address] Phone: [phone] Fax: [phone]  -Patient reports affordability concerns with their medications: Yes  -Patient reports access/transportation concerns to their pharmacy: No  -Patient reports adherence concerns with their medications:  Yes    Chronic Migraine: Current medications:  Emgality  120mg  every 30 days -Medications tried in the past-  Preventive: Topiramate , Nortriptyline, Amitriptyline, Effexor, Cymbalta, Lexapro , Gabapentin , Emgality , Qulipta, Aimovig  (worked), Botox (neuropsychiatric side effects), Pregabalin, Depakote (had adverse reaction - hypokalemia, had hospital admission for behavioral concerns), Lamotrigine Rescue: Tizanidine, Meloxicam , Nurtec, Ubrelvy, sumatriptan , rizatriptan , zolmitriptan, almotriptan, ketorolac  injection 01/31/2022, Fioricet, Baclofen , Flexeril   Other therapies: Botox, occipital nerve block  -Patient states Emgality  is working well for her and is currently covered by insurance and copay is affordable with manufacturer copay card -Has applied for disability and may be eligible for Medicare -Concern with continued insurance  coverage and affordability of Emgality   Objective:  Lab Results  Component Value Date   CREATININE 0.87 07/27/2024   BUN 16 07/27/2024   NA 141 07/27/2024   K 3.7 07/27/2024   CL 101 07/27/2024   CO2 24 07/27/2024   Medications Reviewed Today      Reviewed by Tina Harvey, RPH (Pharmacist) on 08/06/24 at [phone]  Med List Status: <None>   Medication Order Taking? Sig Documenting Provider Last Dose Status Informant  albuterol  (PROVENTIL ) (2.5 MG/3ML) 0.083% nebulizer solution [id number] Yes Take 3 mLs (2.5 mg total) by nebulization every 6 (six) hours as needed for wheezing or shortness of breath. Harvey, Tina P, DO  Active Self  albuterol  (VENTOLIN  HFA) 108 (90 Base) MCG/ACT inhaler [id number] Yes SMARTSIG:2 Puff(s) By Mouth 4 Times Daily PRN Tina Duwaine P, DO  Active Self  atorvastatin  (LIPITOR) 20 MG tablet [id number] Yes Take 1 tablet (20 mg total) by mouth daily. Tina Duwaine P, DO  Active Self  busPIRone  (BUSPAR ) 5 MG tablet [id number] Yes Take 1-3 tablets (5-15 mg total) by mouth 3 (three) times daily.  Patient taking differently: Take 10 mg by mouth daily.   Harvey, Tina P, DO  Active Self  Cholecalciferol (VITAMIN D ) 50 MCG (2000 UT) tablet [id number]  Take 4,000 Units by mouth daily. [provider]  Active Self  cyanocobalamin  (VITAMIN B12) 1000 MCG/ML injection [id number] Yes Inject 1 mL (1,000 mcg total) into the muscle every 30 (thirty) days. Tina Duwaine P, DO  Active Self  diclofenac  Sodium (VOLTAREN ) 1 % GEL [id number] Yes Apply 4 g topically 4 (four) times daily.  Patient taking differently: Apply 4 g topically 4 (four) times daily as needed (pain).   Tina Duwaine P, DO  Active Self  EMGALITY  120 MG/ML SOAJ [id number] Yes Inject 120 mg into the skin every 30 (thirty) days. [provider]  Active Self  EPINEPHrine  0.3 mg/0.3 mL IJ SOAJ injection [id number]  Inject 0.3 mg into the muscle as needed for anaphylaxis. Tina Duwaine P, DO  Active Self  escitalopram  (LEXAPRO ) 20 MG tablet [id number]  Yes Take 1 tablet (20 mg total) by mouth daily. Tina Duwaine SQUIBB, DO  Active Self   Patient not taking:   Discontinued 08/06/24 0842 (Completed Course) ibandronate  (BONIVA ) 150 MG tablet [id number]  Take 1 tablet  (150 mg total) by mouth every 30 (thirty) days. Take in the morning with a full glass of water , on an empty stomach, and do not take anything else by mouth or lie down for the next 30 min. Tina Duwaine P, DO  Active Self  ibuprofen  (ADVIL ) 600 MG tablet [id number] Yes Take 1 tablet (600 mg total) by mouth every 6 (six) hours as needed. Tina Pae, MD  Active Self  Insulin Syringe-Needle U-100 (B-D INSULIN SYRINGE 1CC/25GX1) 25G X 1 1 ML MISC [id number] Yes USE THE NEEDLE AND SYRINGE WITH CYANOCOBALAMIN  1000 MG PER 1 ML EVERY 14 DAYS Harvey, Tina T, NP  Active Self  lisinopril  (ZESTRIL ) 20 MG tablet [id number]  Take 1 tablet (20 mg total) by mouth daily. Tina Duwaine SQUIBB, DO  Active Self  MAGNESIUM PO [id number]  Take 240 mg by mouth daily. [provider]  Active Self  Multiple Vitamins-Minerals (BARIATRIC MULTIVITAMINS PO) [id number]  Take 1 tablet by mouth in the morning and at bedtime. [provider]  Active Self  ondansetron  (ZOFRAN  ODT) 4 MG disintegrating tablet [id number] Yes Take 1 tablet (4 mg total) by mouth every 8 (eight) hours as needed for nausea or vomiting. Tina Duwaine P, DO  Active Self  pantoprazole  (PROTONIX ) 40 MG tablet [id number] Yes Take 1 tablet (40 mg total) by mouth daily. Harvey, Tina P, DO  Active Self  promethazine (PHENERGAN) 25 MG tablet [id number]  Take 25 mg by mouth every 6 (six) hours as needed for vomiting or nausea. [provider]  Active Self  spironolactone  (ALDACTONE ) 25 MG tablet [id number] Yes Take 1 tablet (25 mg total) by mouth daily. Tina Duwaine SQUIBB, DO  Active Self  Syringe/Needle, Disp, 25G X 1 1 ML MISC [id number]  Patient is to use the needle and syringe with cyanocobalamin  1000mcg per 1ml every 14 days Tina, Tina P, DO  Active Self  tiZANidine (ZANAFLEX) 4 MG capsule [id number] Yes Take 1 capsule (4 mg total) by mouth 3 (three) times daily.  Patient taking differently: Take 4 mg by mouth 3 (three) times daily  as needed for muscle spasms.   Tina Duwaine P, DO  Active Self  traZODone (DESYREL) 50 MG tablet [id number] Yes Take 0.5-1 tablets (25-50 mg total) by mouth at bedtime as needed for sleep. Tina Duwaine SQUIBB, DO  Active Self   Patient not taking:   Discontinued 08/06/24 0844 (Completed Course)          Assessment/Plan:   Chronic Migraine: -Currently controlled -Continue Emgail 120mg  every 30 days -At this time patient does not qualify for [employer] PAP for Emgality  based on having commercial insurance and HHI -Provided my direct phone number, so she can notify me if insurance changes or Emgailty becomes no longer affordable  Channing DELENA Mealing, PharmD, DPLA

## 2024-08-19 MED ORDER — CHLORHEXIDINE GLUCONATE 0.12 % MT SOLN
15.0000 mL | Freq: Once | OROMUCOSAL | Status: AC
  Administered 2024-08-20: 15 mL via OROMUCOSAL

## 2024-08-19 MED ORDER — CEFAZOLIN IN SODIUM CHLORIDE 2-0.9 GM/100ML-% IV SOLN
2.0000 g | Freq: Once | INTRAVENOUS | Status: DC
  Filled 2024-08-19: qty 100

## 2024-08-19 MED ORDER — ORAL CARE MOUTH RINSE: 15.0000 mL | Freq: Once | OROMUCOSAL | Status: AC

## 2024-08-19 MED ORDER — LACTATED RINGERS IV SOLN: INTRAVENOUS | Status: DC

## 2024-08-19 MED ORDER — CEFAZOLIN SODIUM-DEXTROSE 2-4 GM/100ML-% IV SOLN: 2.0000 g | INTRAVENOUS | Status: DC

## 2024-08-20 ENCOUNTER — Encounter: Payer: Self-pay | Admitting: Neurosurgery

## 2024-08-20 ENCOUNTER — Ambulatory Visit

## 2024-08-20 ENCOUNTER — Ambulatory Visit
Admission: RE | Admit: 2024-08-20 | Discharge: 2024-08-20 | Disposition: A | Discharge status: Home / Self Care | Attending: Neurosurgery | Admitting: Neurosurgery

## 2024-08-20 ENCOUNTER — Other Ambulatory Visit: Payer: Self-pay

## 2024-08-20 ENCOUNTER — Encounter: Payer: Self-pay | Admitting: Internal Medicine

## 2024-08-20 ENCOUNTER — Encounter
Admission: RE | Disposition: A | Discharge status: Home / Self Care | Payer: Self-pay | Source: Home / Self Care | Attending: Neurosurgery

## 2024-08-20 DIAGNOSIS — M5412 Radiculopathy, cervical region: Secondary | ICD-10-CM | POA: Diagnosis present

## 2024-08-20 DIAGNOSIS — M5481 Occipital neuralgia: Secondary | ICD-10-CM | POA: Insufficient documentation

## 2024-08-20 DIAGNOSIS — I10 Essential (primary) hypertension: Secondary | ICD-10-CM

## 2024-08-20 DIAGNOSIS — Z01818 Encounter for other preprocedural examination: Secondary | ICD-10-CM

## 2024-08-20 DIAGNOSIS — M4802 Spinal stenosis, cervical region: Secondary | ICD-10-CM

## 2024-08-20 DIAGNOSIS — M797 Fibromyalgia: Secondary | ICD-10-CM | POA: Insufficient documentation

## 2024-08-20 DIAGNOSIS — Z79899 Other long term (current) drug therapy: Secondary | ICD-10-CM | POA: Insufficient documentation

## 2024-08-20 LAB — ABO/RH: ABO/RH(D): A POS

## 2024-08-20 SURGERY — ANTERIOR CERVICAL DECOMPRESSION/DISCECTOMY FUSION 1 LEVEL
Anesthesia: General | Site: Spine Cervical

## 2024-08-20 MED ORDER — CEFAZOLIN SODIUM-DEXTROSE 2-4 GM/100ML-% IV SOLN
INTRAVENOUS | Status: AC
  Filled 2024-08-20: qty 100

## 2024-08-20 MED ORDER — OXYCODONE HCL 5 MG/5ML PO SOLN: 5.0000 mg | Freq: Once | ORAL | Status: AC | PRN

## 2024-08-20 MED ORDER — ONDANSETRON HCL 4 MG/2ML IJ SOLN
INTRAMUSCULAR | Status: DC | PRN
Start: 2024-08-20 — End: 2024-08-20
  Administered 2024-08-20: 4 mg via INTRAVENOUS

## 2024-08-20 MED ORDER — BUPIVACAINE-EPINEPHRINE (PF) 0.5% -1:200000 IJ SOLN
INTRAMUSCULAR | Status: DC | PRN
  Administered 2024-08-20: 10 mL

## 2024-08-20 MED ORDER — LIDOCAINE HCL (PF) 2 % IJ SOLN
INTRAMUSCULAR | Status: AC
  Filled 2024-08-20: qty 5

## 2024-08-20 MED ORDER — MIDAZOLAM HCL (PF) 2 MG/2ML IJ SOLN
INTRAMUSCULAR | Status: DC | PRN
Start: 2024-08-20 — End: 2024-08-20
  Administered 2024-08-20: 2 mg via INTRAVENOUS

## 2024-08-20 MED ORDER — CHLORHEXIDINE GLUCONATE 0.12 % MT SOLN
OROMUCOSAL | Status: AC
  Filled 2024-08-20: qty 15

## 2024-08-20 MED ORDER — POLYETHYLENE GLYCOL 3350 17 GM/SCOOP PO POWD
17.0000 g | Freq: Every day | ORAL | 0 refills | Status: DC | PRN
  Filled 2024-08-20: qty 238, 14d supply, fill #0

## 2024-08-20 MED ORDER — LIDOCAINE HCL (CARDIAC) PF 100 MG/5ML IV SOSY
PREFILLED_SYRINGE | INTRAVENOUS | Status: DC | PRN
  Administered 2024-08-20: 80 mg via INTRAVENOUS

## 2024-08-20 MED ORDER — KETAMINE HCL 50 MG/5ML IJ SOSY
PREFILLED_SYRINGE | INTRAMUSCULAR | Status: AC
  Filled 2024-08-20: qty 5

## 2024-08-20 MED ORDER — OXYCODONE HCL 5 MG PO TABS
5.0000 mg | ORAL_TABLET | Freq: Once | ORAL | Status: AC | PRN
  Administered 2024-08-20: 5 mg via ORAL

## 2024-08-20 MED ORDER — EPHEDRINE 5 MG/ML INJ
INTRAVENOUS | Status: AC
  Filled 2024-08-20: qty 5

## 2024-08-20 MED ORDER — FENTANYL CITRATE (PF) 100 MCG/2ML IJ SOLN
INTRAMUSCULAR | Status: AC
  Filled 2024-08-20: qty 2

## 2024-08-20 MED ORDER — KETAMINE HCL 50 MG/5ML IJ SOSY
PREFILLED_SYRINGE | INTRAMUSCULAR | Status: DC | PRN
  Administered 2024-08-20: 30 mg via INTRAVENOUS

## 2024-08-20 MED ORDER — VANCOMYCIN HCL 1000 MG IV SOLR
INTRAVENOUS | Status: AC
  Filled 2024-08-20: qty 20

## 2024-08-20 MED ORDER — TIZANIDINE HCL 4 MG PO TABS
4.0000 mg | ORAL_TABLET | Freq: Three times a day (TID) | ORAL | 0 refills | Status: AC | PRN
  Filled 2024-08-20: qty 90, 30d supply, fill #0

## 2024-08-20 MED ORDER — DEXAMETHASONE SOD PHOSPHATE PF 10 MG/ML IJ SOLN
INTRAMUSCULAR | Status: DC | PRN
  Administered 2024-08-20: 10 mg via INTRAVENOUS

## 2024-08-20 MED ORDER — 0.9 % SODIUM CHLORIDE (POUR BTL) OPTIME
TOPICAL | Status: DC | PRN
  Administered 2024-08-20: 500 mL

## 2024-08-20 MED ORDER — MIDAZOLAM HCL 2 MG/2ML IJ SOLN
INTRAMUSCULAR | Status: AC
  Filled 2024-08-20: qty 2

## 2024-08-20 MED ORDER — SUCCINYLCHOLINE CHLORIDE 200 MG/10ML IV SOSY
PREFILLED_SYRINGE | INTRAVENOUS | Status: DC | PRN
  Administered 2024-08-20: 50 mg via INTRAVENOUS

## 2024-08-20 MED ORDER — EPHEDRINE SULFATE-NACL 50-0.9 MG/10ML-% IV SOSY
PREFILLED_SYRINGE | INTRAVENOUS | Status: DC | PRN
  Administered 2024-08-20: 10 mg via INTRAVENOUS

## 2024-08-20 MED ORDER — PROPOFOL 1000 MG/100ML IV EMUL
INTRAVENOUS | Status: AC
  Filled 2024-08-20: qty 100

## 2024-08-20 MED ORDER — ONDANSETRON HCL 4 MG/2ML IJ SOLN
INTRAMUSCULAR | Status: AC
  Filled 2024-08-20: qty 2

## 2024-08-20 MED ORDER — OXYCODONE HCL 5 MG PO TABS
5.0000 mg | ORAL_TABLET | ORAL | 0 refills | Status: DC | PRN
  Filled 2024-08-20: qty 30, 5d supply, fill #0

## 2024-08-20 MED ORDER — FENTANYL CITRATE (PF) 100 MCG/2ML IJ SOLN
INTRAMUSCULAR | Status: DC | PRN
  Administered 2024-08-20: 100 ug via INTRAVENOUS

## 2024-08-20 MED ORDER — PROPOFOL 10 MG/ML IV BOLUS
INTRAVENOUS | Status: AC
  Filled 2024-08-20: qty 20

## 2024-08-20 MED ORDER — SURGIFLO WITH THROMBIN (HEMOSTATIC MATRIX KIT) OPTIME
TOPICAL | Status: DC | PRN
  Administered 2024-08-20: 1

## 2024-08-20 MED ORDER — FENTANYL CITRATE (PF) 100 MCG/2ML IJ SOLN
25.0000 ug | INTRAMUSCULAR | Status: DC | PRN
  Administered 2024-08-20 (×3): 50 ug via INTRAVENOUS

## 2024-08-20 MED ORDER — SENNA 8.6 MG PO TABS
1.0000 | ORAL_TABLET | Freq: Two times a day (BID) | ORAL | 0 refills | Status: DC | PRN
  Filled 2024-08-20: qty 30, 15d supply, fill #0

## 2024-08-20 MED ORDER — OXYCODONE HCL 5 MG PO TABS
ORAL_TABLET | ORAL | Status: AC
  Filled 2024-08-20: qty 1

## 2024-08-20 MED ORDER — VANCOMYCIN HCL 1000 MG IV SOLR
INTRAVENOUS | Status: DC | PRN
  Administered 2024-08-20: 1000 mg via INTRAVENOUS

## 2024-08-20 MED ORDER — CELECOXIB 200 MG PO CAPS
200.0000 mg | ORAL_CAPSULE | Freq: Two times a day (BID) | ORAL | 0 refills | Status: DC
  Filled 2024-08-20: qty 60, 30d supply, fill #0

## 2024-08-20 MED ORDER — PROPOFOL 10 MG/ML IV BOLUS
INTRAVENOUS | Status: DC | PRN
  Administered 2024-08-20: 150 mg via INTRAVENOUS

## 2024-08-20 SURGICAL SUPPLY — 36 items
BASIN KIT SINGLE STR (MISCELLANEOUS) ×2 IMPLANT
BUR NEURO DRILL SOFT 3.0X3.8M (BURR) ×2 IMPLANT
DERMABOND ADVANCED .7 DNX12 (GAUZE/BANDAGES/DRESSINGS) ×2 IMPLANT
DRAIN CHANNEL JP 10F RND 20C F (MISCELLANEOUS) IMPLANT
DRAPE C ARM PK CFD 31 SPINE (DRAPES) ×2 IMPLANT
DRAPE LAPAROTOMY 77X122 PED (DRAPES) ×2 IMPLANT
DRAPE SPINE LEICA/WILD 54X150 (DRAPES) ×2 IMPLANT
DRSG TEGADERM 4X4.75 (GAUZE/BANDAGES/DRESSINGS) IMPLANT
ELECTRODE REM PT RTRN 9FT ADLT (ELECTROSURGICAL) ×2 IMPLANT
EVACUATOR SILICONE 100CC (DRAIN) IMPLANT
FEE INTRAOP CADWELL SUPPLY NCS (MISCELLANEOUS) IMPLANT
FEE INTRAOP MONITOR IMPULS NCS (MISCELLANEOUS) IMPLANT
GAUZE SPONGE 2X2 STRL 8-PLY (GAUZE/BANDAGES/DRESSINGS) IMPLANT
GLOVE BIOGEL PI IND STRL 6.5 (GLOVE) ×2 IMPLANT
GLOVE SURG SYN 6.5 PF PI (GLOVE) ×2 IMPLANT
GLOVE SURG SYN 8.5 PF PI (GLOVE) ×6 IMPLANT
GOWN SRG LRG LVL 4 IMPRV REINF (GOWNS) ×2 IMPLANT
GOWN SRG XL LVL 3 NONREINFORCE (GOWNS) ×2 IMPLANT
KIT TURNOVER KIT A (KITS) ×2 IMPLANT
MANIFOLD NEPTUNE II (INSTRUMENTS) ×2 IMPLANT
NS IRRIG 500ML POUR BTL (IV SOLUTION) ×2 IMPLANT
PACK LAMINECTOMY ARMC (PACKS) ×2 IMPLANT
PAD ARMBOARD POSITIONER FOAM (MISCELLANEOUS) ×4 IMPLANT
PIN CASPAR 14 (PIN) ×2 IMPLANT
PLATE ACP 1.6 18 (Plate) IMPLANT
SCREW ACP VA SD 3.5X15 (Screw) IMPLANT
SPACER CERVICAL FRGE 12X14X8-7 (Spacer) IMPLANT
SPONGE KITTNER 5P (MISCELLANEOUS) ×2 IMPLANT
STAPLER SKIN PROX 35W (STAPLE) IMPLANT
SURGIFLO W/THROMBIN 8M KIT (HEMOSTASIS) ×2 IMPLANT
SUT STRATA 3-0 15 PS-2 (SUTURE) ×2 IMPLANT
SUT VIC AB 3-0 SH 8-18 (SUTURE) ×2 IMPLANT
SUTURE EHLN 3-0 FS-10 30 BLK (SUTURE) IMPLANT
SYR 20ML LL LF (SYRINGE) ×2 IMPLANT
TAPE CLOTH 3X10 WHT NS LF (GAUZE/BANDAGES/DRESSINGS) ×4 IMPLANT
TRAP FLUID SMOKE EVACUATOR (MISCELLANEOUS) ×2 IMPLANT

## 2024-08-20 NOTE — Transfer of Care (Signed)
 Immediate Anesthesia Transfer of Care Note  Patient: Tina Harvey  Procedure(s) Performed: ANTERIOR CERVICAL DECOMPRESSION/DISCECTOMY FUSION 1 LEVEL (Spine Cervical)  Patient Location: PACU  Anesthesia Type:General  Level of Consciousness: awake and alert   Airway & Oxygen Therapy: Patient Spontanous Breathing and Patient connected to face mask oxygen  Post-op Assessment: Report given to RN  Post vital signs: Reviewed and stable  Last Vitals:  Vitals Value Taken Time  BP 148/90 08/20/24 10:20  Temp 36.4 C 08/20/24 10:20  Pulse 95 08/20/24 10:23  Resp 22 08/20/24 10:23  SpO2 100 % 08/20/24 10:23  Vitals shown include unfiled device data.  Last Pain:  Vitals:   08/20/24 1020  TempSrc:   PainSc: Asleep      Patients Stated Pain Goal: 0 (08/20/24 0721)  Complications: No notable events documented.

## 2024-08-20 NOTE — Op Note (Signed)
 Indications: [name] is a 53 y.o. female with M54.12 cervical radiculopathy, M48.02 cervical stenosis of spinal canal   Findings: stenosis, successful decompression  Preoperative Diagnosis: M54.12 cervical radiculopathy, M48.02 cervical stenosis of spinal canal  Postoperative Diagnosis: same   EBL: 5 ml IVF: see anesthesia record Drains: none Disposition: Extubated and Stable to PACU Complications: none  No foley catheter was placed.   Preoperative Note:    Risks of surgery discussed include: infection, bleeding, stroke, coma, death, paralysis, CSF leak, nerve/spinal cord injury, numbness, tingling, weakness, complex regional pain syndrome, recurrent stenosis and/or disc herniation, vascular injury, development of instability, neck/back pain, need for further surgery, persistent symptoms, development of deformity, and the risks of anesthesia. The patient understood these risks and agreed to proceed.  Operative Note:   Procedure:  1) Anterior cervical diskectomy and fusion at C6-7 2) Anterior cervical instrumentation at C6-7 3) Insertion of structural allograft at C6-7   Procedure: After obtaining informed consent, the patient taken to the operating room, placed in supine position, general anesthesia induced.  The patient had a small shoulder roll placed behind their shoulders.  The patient received preop antibiotics and IV Decadron .  A timeout was performed. The patient had a neck incision outlined, was prepped and draped in usual sterile fashion. The incision was injected with local anesthetic.   An incision was opened, dissection taken down medial to the carotid artery and jugular vein, lateral to the trachea and esophagus.  The prevertebral fascia was identified, and a localizing x-ray demonstrated the correct level.  The longus colli were dissected laterally, and self-retaining retractors placed to open the operative field. The microscope was then brought into the  field.  With this complete, distractor pins were placed in the vertebral bodies of C6 and C7. The distractor was placed, and the annulus at C6/7 was opened using a bovie.  Curettes and pituitary rongeurs used to remove the majority of disk, then the drill was used to remove the posterior osteophyte, expose the posterior longitudinal ligament, and begin the foraminotomies. The nerve hook was used to elevate the posterior longitudinal ligament, which was then removed with Kerrison rongeurs to complete decompression of the spinal cord. The Kerrison rongeurs were then used to complete the foraminotomies bilaterally to decompress the nerve roots. The nerve hook could be passed out each foramen, ensuring decompression of the nerve roots. Meticulous hemostasis was obtained. A structural allograft (Globus Forge, 8 mm height) was placed at C6/7.   Please note that the procedure included removal of the disc, removal of the posterior osteophytes, and removal of the posterior longitudinal ligament to ensure decompression of the spinal cord.  Additionally, foraminotomies were performed on both sides of the spinal canal to decompress the nerve roots.  The caspar distractor was removed, and bone wax used for hemostasis. A separate, 18 mm 2 segment Nuvasive ACP plate was chosen to bridge.  Two screws placed in each vertebral body, respectively making sure the screws were behind the locking mechanism.  Final AP and lateral radiographs were taken.   Please note that the plate is not inclusive to the biomechanical device.  The anchoring mechanism of the plate is completely separate from the biomechanical device.  With everything in good position, the wound was irrigated copiously and meticulous hemostasis obtained.  Wound was closed in 2 layers using interrupted inverted 3-0 Vicryl sutures.  The wound was dressed with dermabond, the head of bed at 30 degrees, taken to recovery room in stable condition.  No new postop  neurological deficits were identified.  Sponge and pattie counts were correct at the end of the procedure.   I performed the entire procedure.   Tina Daisy MD

## 2024-08-20 NOTE — Anesthesia Procedure Notes (Signed)
 Procedure Name: Intubation Date/Time: 08/20/2024 8:34 AM  Performed by: Veronica Alm BROCKS, CRNAPre-anesthesia Checklist: Patient identified, Patient being monitored, Timeout performed, Emergency Drugs available and Suction available Patient Re-evaluated:Patient Re-evaluated prior to induction Oxygen Delivery Method: Circle system utilized Preoxygenation: Pre-oxygenation with 100% oxygen Induction Type: IV induction Ventilation: Mask ventilation without difficulty Laryngoscope Size: 3 and McGrath Grade View: Grade I Tube type: Oral Tube size: 7.0 mm Number of attempts: 1 Airway Equipment and Method: Stylet Placement Confirmation: ETT inserted through vocal cords under direct vision, positive ETCO2 and breath sounds checked- equal and bilateral Secured at: 21 cm Tube secured with: Tape Dental Injury: Teeth and Oropharynx as per pre-operative assessment

## 2024-08-20 NOTE — Progress Notes (Signed)
 Patient voided and ambulated back to post op room with this RN, no difficulty noted. Pain tolerable at this time, po med has helped. Tolerating po without difficulty (ice chips, small sips of water , and working on apple sauce and mango italian ice).
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
## 2024-08-20 NOTE — Interval H&P Note (Addendum)
 History and Physical Interval Note:  08/20/2024 7:14 AM  Tina Harvey  has presented today for surgery, with the diagnosis of M54.12 cervical radiculopathy M48.02 cervical stenosis of spinal canal.  The various methods of treatment have been discussed with the patient and family. After consideration of risks, benefits and other options for treatment, the patient has consented to  Procedure(s) with comments: ANTERIOR CERVICAL DECOMPRESSION/DISCECTOMY FUSION 1 LEVEL (N/A) - C6-7 ANTERIOR CERVICAL DISCECTOMY AND FUSION as a surgical intervention.  The patient's history has been reviewed, patient examined, no change in status, stable for surgery.  I have reviewed the patient's chart and labs.  Questions were answered to the patient's satisfaction.    Heart sounds normal no MRG. Chest Clear to Auscultation Bilaterally.  [name]   Please note that I orginially recommended an arthroplasty, but this procedure was not covered.  We then transitioned to an anterior cervical discectomy and fusion which is also appropriate.

## 2024-08-20 NOTE — Anesthesia Postprocedure Evaluation (Signed)
 Anesthesia Post Note  Patient: Tina Harvey  Procedure(s) Performed: ANTERIOR CERVICAL DECOMPRESSION/DISCECTOMY FUSION 1 LEVEL (Spine Cervical)  Patient location during evaluation: PACU Anesthesia Type: General Level of consciousness: awake and alert Pain management: pain level controlled Vital Signs Assessment: post-procedure vital signs reviewed and stable Respiratory status: spontaneous breathing, nonlabored ventilation, respiratory function stable and patient connected to nasal cannula oxygen Cardiovascular status: blood pressure returned to baseline and stable Postop Assessment: no apparent nausea or vomiting Anesthetic complications: no   No notable events documented.   Last Vitals:  Vitals:   08/20/24 1145 08/20/24 1204  BP: (!) 144/95 (!) 158/89  Pulse: 84 82  Resp: 12 15  Temp:  36.5 C  SpO2: 93% 93%    Last Pain:  Vitals:   08/20/24 1204  TempSrc: Temporal  PainSc: 0-No pain                 Debby Mines

## 2024-08-20 NOTE — Discharge Instructions (Addendum)

## 2024-08-23 ENCOUNTER — Encounter: Payer: Self-pay | Admitting: Neurosurgery

## 2024-08-24 NOTE — Progress Notes (Signed)
   REFERRING PHYSICIAN:  Vicci Duwaine SHAUNNA Rosalea [address] [place],  [place] [address]  DOS: 08/20/24  ACDF C6-C7  HISTORY OF PRESENT ILLNESS: Tina Harvey is 2 weeks status post above surgery. Given celebrex , oxycodone , and tizanidine  on discharge from the hospital.  She has expected postop posterior neck pain into her shoulders and down the arms. Tingling in her hands is better than it was prior to surgery. She notes some burning in her arms with occasional electric sensations. Having some issues swallowing- this is improving.   She sneezed a few days ago and felt a pop in her neck. Xrays were done today.    PHYSICAL EXAMINATION:  NEUROLOGICAL:  General: In no acute distress.   Awake, alert, oriented to person, place, and time.  Pupils equal round and reactive to light.  Facial tone is symmetric.    Strength: Side Biceps Triceps Deltoid Interossei Grip Wrist Ext. Wrist Flex.  R 5 5 5 5 5 5 5   L 5 5 5 5 5 5 5    No gross weakness, but she does not give a great effort in strength testing.   Incision c/d/i  Imaging:  Cervical xrays dated 09/02/24:  No complications noted.   Report for above xrays not yet available.   Assessment / Plan: Tina Harvey is doing fair s/p above surgery. Treatment options reviewed with patient and following plan made:   - We discussed activity escalation and I have advised the patient to lift up to 10 pounds until 6 weeks after surgery (until follow up with Dr. Clois).   - Reviewed wound care.  - Continue current medications including prn oxycodone , celebrex , and prn tizanidine .  - Medrol  dose pack sent to pharmacy. Will hold celebrex  while taking this.  - Follow up as scheduled in 4 weeks and prn.   Advised to contact the office if any questions or concerns arise.   Glade Boys PA-C Dept of Neurosurgery

## 2024-08-31 ENCOUNTER — Other Ambulatory Visit: Payer: Self-pay

## 2024-08-31 ENCOUNTER — Telehealth: Payer: Self-pay | Admitting: Neurosurgery

## 2024-08-31 DIAGNOSIS — M4802 Spinal stenosis, cervical region: Secondary | ICD-10-CM

## 2024-08-31 DIAGNOSIS — Z981 Arthrodesis status: Secondary | ICD-10-CM

## 2024-08-31 MED ORDER — OXYCODONE HCL 5 MG PO TABS: 5.0000 mg | ORAL_TABLET | ORAL | 0 refills | Status: DC | PRN

## 2024-08-31 NOTE — Telephone Encounter (Signed)
 Patient advised. Xray ordered and she will get this done on 09/02/24

## 2024-08-31 NOTE — Telephone Encounter (Signed)
 ACDF C6-C7 on 08/20/24   It's unlikely that there is any change in hardware placement with a sneeze, but if she is concerned then we can get xrays.   Can do prior to her visit on 12/4 or she can come into tomorrow to get them.

## 2024-08-31 NOTE — Telephone Encounter (Signed)
 Patient advised. Patient asked if we were planning on doing xrays on her on 12/4-advised we are not for the first post op follow up. She is concerned that she messed her neck up. She sneezed on 08/27/2024 and felt a pop in the back of her neck, since then has had more intense pain in her neck and feeling of numbness in her shoulder blades. She has been taking Tizanidine  1 tablet 3 times daily, Tylenol  325 mg 2 tablets once a day since 08/27/2024 and still taking Celebrex . Patient would like to know if she should get an xray when she comes in or any other advise at this time?

## 2024-08-31 NOTE — Telephone Encounter (Signed)
 Date12/2/25 Provider CLOIS Med Requesting Oxycodone  5mg  immediate release tablets Zanaflex  / Tizanidine  4mg   Pharmacy-[facility] in [place] Patient contact [phone] /

## 2024-08-31 NOTE — Telephone Encounter (Signed)
 ACDF C6-C7 on 08/20/24.   She was given 1 month supply of tizanidine  on 08/20/24- should not need refill.   Oxycodone  refill sent to pharmacy. Please let her know.   PMP reviewed and is appropriate.

## 2024-09-02 ENCOUNTER — Encounter: Payer: Self-pay | Admitting: Orthopedic Surgery

## 2024-09-02 ENCOUNTER — Ambulatory Visit: Admitting: Orthopedic Surgery

## 2024-09-02 ENCOUNTER — Other Ambulatory Visit

## 2024-09-02 VITALS — BP 120/84 | Temp 98.3°F

## 2024-09-02 DIAGNOSIS — M4802 Spinal stenosis, cervical region: Secondary | ICD-10-CM

## 2024-09-02 DIAGNOSIS — Z981 Arthrodesis status: Secondary | ICD-10-CM

## 2024-09-02 DIAGNOSIS — M5412 Radiculopathy, cervical region: Secondary | ICD-10-CM

## 2024-09-02 MED ORDER — METHYLPREDNISOLONE 4 MG PO TBPK: ORAL_TABLET | ORAL | 0 refills | Status: DC

## 2024-09-02 NOTE — Patient Instructions (Signed)
 It was nice to see you today.   Okay to get incision wet in the shower, do not submerge in pool or hot tub.   Call if any concerns about the incision such as redness, drainage, or fever/chills.   Try to avoid bending, twisting, or lifting. You can lift up to 10 pounds until your follow up with Dr.  Clois in 4 weeks.   I sent a steroid dose pack to the pharmacy. Take as directed. Stop celebrex  while taking this.   Continue on oxycodone  and tizanidine  as needed.   We will see you back in 4 weeks for your 6 weeks postop visit.   Please call with any questions or concerns.   Glade Boys PA-C [phone]     The physicians and staff at [facility] at [facility] are committed to providing excellent care. You may receive a survey asking for feedback about your experience at our office. We value you your feedback and appreciate you taking the time to to fill it out. The [facility] leadership team is also available to discuss your experience in person, feel free to contact us  [phone].

## 2024-09-13 ENCOUNTER — Other Ambulatory Visit: Payer: Self-pay | Admitting: Neurosurgery

## 2024-09-13 DIAGNOSIS — G959 Disease of spinal cord, unspecified: Secondary | ICD-10-CM

## 2024-09-20 ENCOUNTER — Telehealth: Payer: Self-pay | Admitting: Neurosurgery

## 2024-09-20 DIAGNOSIS — M4802 Spinal stenosis, cervical region: Secondary | ICD-10-CM

## 2024-09-20 DIAGNOSIS — Z981 Arthrodesis status: Secondary | ICD-10-CM

## 2024-09-20 MED ORDER — OXYCODONE HCL 5 MG PO TABS: 5.0000 mg | ORAL_TABLET | Freq: Four times a day (QID) | ORAL | 0 refills | Status: DC | PRN

## 2024-09-20 NOTE — Telephone Encounter (Signed)
 Prescription Request  09/20/2024  SURGERY: 08/20/2024 C6-7 ACDF LOV: 09/02/2024  What is the name of the medication or equipment? oxyCODONE  (ROXICODONE ) 5 MG immediate release tablet   Have you contacted your pharmacy to request a refill? No   Which pharmacy would you like this sent to? [facility]  [facility] [address], [place] - [address] [place] [address] Phone: [phone] Fax: [phone]  [facility] - Closed Permanently [address], [address] [place] [address] Phone: [phone] Fax: [phone]  [facility] [address] [place] [address] Phone: [phone] Fax: [phone]  [id number] [place], [place] - [address] AT [facility] OF [address] [place] [address] Phone: [phone] Fax: [phone]    Patient notified that their request is being sent to the clinical staff for review and that they should receive a response within 2 business days.   Please advise at [facility] [phone]

## 2024-09-20 NOTE — Telephone Encounter (Signed)
 SURGERY: 08/20/2024 C6-7 ACDF   Refill of oxycodone  sent to pharmacy ([facility] in [place]). Will change directions to every 6 hours as needed for severe pain. Please let her know.   PMP reviewed and is appropriate.

## 2024-09-20 NOTE — Telephone Encounter (Signed)
 Patient notified and voiced understanding.

## 2024-09-20 NOTE — Telephone Encounter (Signed)
 Left message to call back.

## 2024-09-21 ENCOUNTER — Other Ambulatory Visit

## 2024-09-21 ENCOUNTER — Ambulatory Visit (INDEPENDENT_AMBULATORY_CARE_PROVIDER_SITE_OTHER): Admitting: Neurosurgery

## 2024-09-21 VITALS — BP 110/76 | Temp 98.1°F

## 2024-09-21 DIAGNOSIS — G959 Disease of spinal cord, unspecified: Secondary | ICD-10-CM | POA: Diagnosis not present

## 2024-09-21 DIAGNOSIS — M5412 Radiculopathy, cervical region: Secondary | ICD-10-CM

## 2024-09-21 DIAGNOSIS — Z981 Arthrodesis status: Secondary | ICD-10-CM

## 2024-09-21 DIAGNOSIS — M4802 Spinal stenosis, cervical region: Secondary | ICD-10-CM

## 2024-09-21 NOTE — Progress Notes (Signed)
" ° °  REFERRING PHYSICIAN:  Vicci Duwaine SHAUNNA Rosalea [address] [place],  [place] [address]  DOS: 08/20/24  ACDF C6-C7  HISTORY OF PRESENT ILLNESS: Tina Harvey is status post above surgery.   She has been improving.  She continues to have some pain in between her shoulder blades.  Her arm discomfort is much better.  Her headaches have improved.   PHYSICAL EXAMINATION:  NEUROLOGICAL:  General: In no acute distress.   Awake, alert, oriented to person, place, and time.  Pupils equal round and reactive to light.  Facial tone is symmetric.    Strength: Side Biceps Triceps Deltoid Interossei Grip Wrist Ext. Wrist Flex.  R 5 5 5 5 5 5 5   L 5 5 5 5 5 5 5    Incision c/d/i  Imaging:  Cervical xrays today show no complications  Assessment / Plan: Tina Harvey is doing better s/p above surgery.  I have given her some exercises.  We also discussed physical therapy, but she would like to hold off for now.  Will see her back in 6 weeks.  We reviewed activity limitations.    Reeves Daisy MD Dept of Neurosurgery  "

## 2024-10-20 ENCOUNTER — Encounter: Payer: Self-pay | Admitting: Internal Medicine

## 2024-10-27 ENCOUNTER — Encounter: Payer: Self-pay | Admitting: Family Medicine

## 2024-10-27 ENCOUNTER — Ambulatory Visit (INDEPENDENT_AMBULATORY_CARE_PROVIDER_SITE_OTHER): Admitting: Family Medicine

## 2024-10-27 VITALS — HR 88 | Temp 97.5°F | Resp 17 | Ht 67.01 in | Wt 195.8 lb

## 2024-10-27 DIAGNOSIS — E559 Vitamin D deficiency, unspecified: Secondary | ICD-10-CM | POA: Diagnosis not present

## 2024-10-27 DIAGNOSIS — Z0000 Encounter for general adult medical examination without abnormal findings: Secondary | ICD-10-CM

## 2024-10-27 DIAGNOSIS — Z1231 Encounter for screening mammogram for malignant neoplasm of breast: Secondary | ICD-10-CM

## 2024-10-27 DIAGNOSIS — F32A Depression, unspecified: Secondary | ICD-10-CM | POA: Diagnosis not present

## 2024-10-27 DIAGNOSIS — E611 Iron deficiency: Secondary | ICD-10-CM | POA: Diagnosis not present

## 2024-10-27 DIAGNOSIS — K921 Melena: Secondary | ICD-10-CM | POA: Diagnosis not present

## 2024-10-27 DIAGNOSIS — E538 Deficiency of other specified B group vitamins: Secondary | ICD-10-CM

## 2024-10-27 DIAGNOSIS — F419 Anxiety disorder, unspecified: Secondary | ICD-10-CM

## 2024-10-27 DIAGNOSIS — M109 Gout, unspecified: Secondary | ICD-10-CM

## 2024-10-27 DIAGNOSIS — I10 Essential (primary) hypertension: Secondary | ICD-10-CM

## 2024-10-27 DIAGNOSIS — E782 Mixed hyperlipidemia: Secondary | ICD-10-CM | POA: Diagnosis not present

## 2024-10-27 LAB — MICROALBUMIN, URINE WAIVED
Creatinine, Urine Waived: 300 mg/dL (ref 10–300)
Microalb, Ur Waived: 150 mg/L — ABNORMAL HIGH (ref 0–19)

## 2024-10-27 LAB — BAYER DCA HB A1C WAIVED: HB A1C (BAYER DCA - WAIVED): 5.6 % (ref 4.8–5.6)

## 2024-10-27 MED ORDER — IBANDRONATE SODIUM 150 MG PO TABS: 150.0000 mg | ORAL_TABLET | ORAL | 4 refills | Status: AC

## 2024-10-27 MED ORDER — CYANOCOBALAMIN 1000 MCG/ML IJ SOLN: 1000.0000 ug | INTRAMUSCULAR | 3 refills | Status: AC

## 2024-10-27 MED ORDER — TRAZODONE HCL 50 MG PO TABS: 25.0000 mg | ORAL_TABLET | Freq: Every evening | ORAL | 1 refills | Status: AC | PRN

## 2024-10-27 MED ORDER — LISINOPRIL 20 MG PO TABS: 20.0000 mg | ORAL_TABLET | Freq: Every day | ORAL | 0 refills | Status: AC

## 2024-10-27 MED ORDER — SPIRONOLACTONE 25 MG PO TABS: 25.0000 mg | ORAL_TABLET | Freq: Every day | ORAL | 0 refills | Status: AC

## 2024-10-27 MED ORDER — ESCITALOPRAM OXALATE 20 MG PO TABS: 20.0000 mg | ORAL_TABLET | Freq: Every day | ORAL | 0 refills | Status: AC

## 2024-10-27 MED ORDER — ATORVASTATIN CALCIUM 20 MG PO TABS: 20.0000 mg | ORAL_TABLET | Freq: Every day | ORAL | 0 refills | Status: AC

## 2024-10-27 MED ORDER — PANTOPRAZOLE SODIUM 40 MG PO TBEC: 40.0000 mg | DELAYED_RELEASE_TABLET | Freq: Every day | ORAL | 0 refills | Status: AC

## 2024-10-27 MED ORDER — CELECOXIB 200 MG PO CAPS: 200.0000 mg | ORAL_CAPSULE | Freq: Two times a day (BID) | ORAL | 1 refills | Status: AC

## 2024-10-27 NOTE — Assessment & Plan Note (Signed)
 Rechecking labs today. Await results. Treat as needed.

## 2024-10-27 NOTE — Assessment & Plan Note (Signed)
 Under good control on current regimen. Continue current regimen. Continue to monitor. Call with any concerns. Refills given. Labs drawn today.

## 2024-10-27 NOTE — Assessment & Plan Note (Signed)
 Under good control on current regimen. Continue current regimen. Continue to monitor. Call with any concerns. Refills given.
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
## 2024-10-28 LAB — CBC WITH DIFFERENTIAL/PLATELET
Basophils Absolute: 0 10*3/uL (ref 0.0–0.2)
Basos: 1 %
EOS (ABSOLUTE): 0.4 10*3/uL (ref 0.0–0.4)
Eos: 5 %
Hematocrit: 40.6 % (ref 34.0–46.6)
Hemoglobin: 13.2 g/dL (ref 11.1–15.9)
Immature Grans (Abs): 0 10*3/uL (ref 0.0–0.1)
Immature Granulocytes: 0 %
Lymphocytes Absolute: 2.2 10*3/uL (ref 0.7–3.1)
Lymphs: 26 %
MCH: 29 pg (ref 26.6–33.0)
MCHC: 32.5 g/dL (ref 31.5–35.7)
MCV: 89 fL (ref 79–97)
Monocytes Absolute: 0.8 10*3/uL (ref 0.1–0.9)
Monocytes: 9 %
Neutrophils Absolute: 5.2 10*3/uL (ref 1.4–7.0)
Neutrophils: 59 %
Platelets: 305 10*3/uL (ref 150–450)
RBC: 4.55 x10E6/uL (ref 3.77–5.28)
RDW: 13.1 % (ref 11.7–15.4)
WBC: 8.8 10*3/uL (ref 3.4–10.8)

## 2024-10-28 LAB — LIPID PANEL W/O CHOL/HDL RATIO
Cholesterol, Total: 198 mg/dL (ref 100–199)
HDL: 43 mg/dL
LDL Chol Calc (NIH): 114 mg/dL — ABNORMAL HIGH (ref 0–99)
Triglycerides: 236 mg/dL — ABNORMAL HIGH (ref 0–149)
VLDL Cholesterol Cal: 41 mg/dL — ABNORMAL HIGH (ref 5–40)

## 2024-10-28 LAB — URIC ACID: Uric Acid: 5.2 mg/dL (ref 3.0–7.2)

## 2024-10-28 LAB — COMPREHENSIVE METABOLIC PANEL WITH GFR
ALT: 17 [IU]/L (ref 0–32)
AST: 22 [IU]/L (ref 0–40)
Albumin: 4.3 g/dL (ref 3.8–4.9)
Alkaline Phosphatase: 111 [IU]/L (ref 49–135)
BUN/Creatinine Ratio: 11 (ref 9–23)
BUN: 10 mg/dL (ref 6–24)
Bilirubin Total: 0.4 mg/dL (ref 0.0–1.2)
CO2: 25 mmol/L (ref 20–29)
Calcium: 9.9 mg/dL (ref 8.7–10.2)
Chloride: 100 mmol/L (ref 96–106)
Creatinine, Ser: 0.87 mg/dL (ref 0.57–1.00)
Globulin, Total: 3.4 g/dL (ref 1.5–4.5)
Glucose: 87 mg/dL (ref 70–99)
Potassium: 3.9 mmol/L (ref 3.5–5.2)
Sodium: 141 mmol/L (ref 134–144)
Total Protein: 7.7 g/dL (ref 6.0–8.5)
eGFR: 80 mL/min/{1.73_m2}

## 2024-10-28 LAB — TSH: TSH: 1.23 u[IU]/mL (ref 0.450–4.500)

## 2024-10-28 LAB — HEPATITIS B SURFACE ANTIBODY, QUANTITATIVE: Hepatitis B Surf Ab Quant: 17.6 m[IU]/mL

## 2024-10-28 LAB — VITAMIN B12: Vitamin B-12: 883 pg/mL (ref 232–1245)

## 2024-10-28 LAB — VITAMIN D 25 HYDROXY (VIT D DEFICIENCY, FRACTURES): Vit D, 25-Hydroxy: 56.3 ng/mL (ref 30.0–100.0)

## 2024-10-28 LAB — FERRITIN: Ferritin: 329 ng/mL — ABNORMAL HIGH (ref 15–150)

## 2024-10-29 ENCOUNTER — Ambulatory Visit: Payer: Self-pay | Admitting: Family Medicine

## 2024-10-29 ENCOUNTER — Other Ambulatory Visit: Payer: Self-pay

## 2024-10-29 DIAGNOSIS — M4802 Spinal stenosis, cervical region: Secondary | ICD-10-CM

## 2024-11-01 NOTE — Progress Notes (Unsigned)
" ° °  REFERRING PHYSICIAN:  Vicci Duwaine SHAUNNA Rosalea [address] [place],  [place] [address]  DOS: 08/20/24  ACDF C6-C7  HISTORY OF PRESENT ILLNESS:  She was doing well at her last visit. Arm pain was much better but she still had some pain in between her shoulder blades.   She was given exercises at last visit. PT discussed but she wanted to hold off.        She has expected postop posterior neck pain into her shoulders and down the arms. Tingling in her hands is better than it was prior to surgery. She notes some burning in her arms with occasional electric sensations. Having some issues swallowing- this is improving.   She sneezed a few days ago and felt a pop in her neck. Xrays were done today.    PHYSICAL EXAMINATION:  NEUROLOGICAL:  General: In no acute distress.   Awake, alert, oriented to person, place, and time.  Pupils equal round and reactive to light.  Facial tone is symmetric.    Strength: Side Biceps Triceps Deltoid Interossei Grip Wrist Ext. Wrist Flex.  R 5 5 5 5 5 5 5   L 5 5 5 5 5 5 5    No gross weakness, but she does not give a great effort in strength testing. ***  Incision well healed.   Imaging:  Cervical xrays dated ***:  No complications noted. ***  Report for above xrays not yet available.   Assessment / Plan: ATHANASIA STANWOOD is doing *** s/p above surgery. Treatment options reviewed with patient and following plan made:   - She can return to activity as tolerated.  - Follow up with Dr. Clois in 6 months with repeat xrays.   Advised to contact the office if any questions or concerns arise.   Glade Boys PA-C Dept of Neurosurgery  "

## 2024-11-08 ENCOUNTER — Encounter: Admitting: Orthopedic Surgery

## 2024-11-08 ENCOUNTER — Other Ambulatory Visit

## 2024-11-08 DIAGNOSIS — M4802 Spinal stenosis, cervical region: Secondary | ICD-10-CM

## 2024-11-08 DIAGNOSIS — Z981 Arthrodesis status: Secondary | ICD-10-CM

## 2024-12-20 ENCOUNTER — Other Ambulatory Visit

## 2024-12-22 ENCOUNTER — Ambulatory Visit: Admitting: Internal Medicine

## 2024-12-22 ENCOUNTER — Ambulatory Visit

## 2024-12-31 ENCOUNTER — Encounter

## 2025-04-26 ENCOUNTER — Ambulatory Visit: Admitting: Family Medicine
